# Patient Record
Sex: Female | Born: 1935 | Smoking: Never smoker
Health system: Southern US, Community
[De-identification: ages and names within clinical notes are randomized; demographics above are authoritative.]

## PROBLEM LIST (undated history)

## (undated) DIAGNOSIS — E039 Hypothyroidism, unspecified: Secondary | ICD-10-CM

## (undated) DIAGNOSIS — E213 Hyperparathyroidism, unspecified: Secondary | ICD-10-CM

## (undated) DIAGNOSIS — N189 Chronic kidney disease, unspecified: Secondary | ICD-10-CM

## (undated) DIAGNOSIS — E7800 Pure hypercholesterolemia, unspecified: Secondary | ICD-10-CM

## (undated) DIAGNOSIS — E559 Vitamin D deficiency, unspecified: Secondary | ICD-10-CM

## (undated) DIAGNOSIS — I10 Essential (primary) hypertension: Secondary | ICD-10-CM

## (undated) DIAGNOSIS — M810 Age-related osteoporosis without current pathological fracture: Secondary | ICD-10-CM

## (undated) DIAGNOSIS — I4891 Unspecified atrial fibrillation: Secondary | ICD-10-CM

## (undated) DIAGNOSIS — R911 Solitary pulmonary nodule: Secondary | ICD-10-CM

## (undated) DIAGNOSIS — K5900 Constipation, unspecified: Secondary | ICD-10-CM

## (undated) DIAGNOSIS — T7840XA Allergy, unspecified, initial encounter: Secondary | ICD-10-CM

## (undated) DIAGNOSIS — I5031 Acute diastolic (congestive) heart failure: Secondary | ICD-10-CM

## (undated) DIAGNOSIS — R350 Frequency of micturition: Secondary | ICD-10-CM

## (undated) DIAGNOSIS — C189 Malignant neoplasm of colon, unspecified: Secondary | ICD-10-CM

## (undated) DIAGNOSIS — I509 Heart failure, unspecified: Secondary | ICD-10-CM

## (undated) HISTORY — DX: Constipation, unspecified: K59.00

## (undated) HISTORY — DX: Allergy, unspecified, initial encounter: T78.40XA

## (undated) HISTORY — DX: Pure hypercholesterolemia, unspecified: E78.00

## (undated) HISTORY — DX: Unspecified atrial fibrillation: I48.91

## (undated) HISTORY — DX: Heart failure, unspecified: I50.9

## (undated) HISTORY — DX: Hyperparathyroidism, unspecified: E21.3

## (undated) HISTORY — DX: Hypothyroidism, unspecified: E03.9

## (undated) HISTORY — DX: Chronic kidney disease, unspecified: N18.9

## (undated) HISTORY — DX: Age-related osteoporosis without current pathological fracture: M81.0

## (undated) HISTORY — DX: Solitary pulmonary nodule: R91.1

## (undated) HISTORY — DX: Frequency of micturition: R35.0

## (undated) HISTORY — DX: Essential (primary) hypertension: I10

## (undated) HISTORY — DX: Vitamin D deficiency, unspecified: E55.9

## (undated) HISTORY — PX: OTHER SURGICAL HISTORY: SHX169

---

## 2014-05-29 ENCOUNTER — Other Ambulatory Visit: Payer: Self-pay | Admitting: Internal Medicine

## 2014-05-29 DIAGNOSIS — R0989 Other specified symptoms and signs involving the circulatory and respiratory systems: Secondary | ICD-10-CM

## 2014-06-06 ENCOUNTER — Ambulatory Visit
Admission: RE | Admit: 2014-06-06 | Discharge: 2014-06-06 | Disposition: A | Discharge status: Home / Self Care | Source: Ambulatory Visit | Attending: Internal Medicine | Admitting: Internal Medicine

## 2014-06-06 DIAGNOSIS — R0989 Other specified symptoms and signs involving the circulatory and respiratory systems: Secondary | ICD-10-CM

## 2014-06-12 ENCOUNTER — Other Ambulatory Visit: Payer: Self-pay | Admitting: Gastroenterology

## 2014-06-12 ENCOUNTER — Ambulatory Visit
Admission: RE | Admit: 2014-06-12 | Discharge: 2014-06-12 | Disposition: A | Discharge status: Home / Self Care | Source: Ambulatory Visit | Attending: Gastroenterology | Admitting: Gastroenterology

## 2014-06-12 DIAGNOSIS — R911 Solitary pulmonary nodule: Secondary | ICD-10-CM

## 2014-06-12 DIAGNOSIS — R1084 Generalized abdominal pain: Secondary | ICD-10-CM

## 2014-06-12 DIAGNOSIS — R109 Unspecified abdominal pain: Secondary | ICD-10-CM

## 2014-06-20 ENCOUNTER — Other Ambulatory Visit: Payer: Medicaid Other

## 2014-06-22 ENCOUNTER — Ambulatory Visit
Admission: RE | Admit: 2014-06-22 | Discharge: 2014-06-22 | Disposition: A | Discharge status: Home / Self Care | Payer: Medicaid Other | Source: Ambulatory Visit | Attending: Gastroenterology | Admitting: Gastroenterology

## 2014-06-22 ENCOUNTER — Ambulatory Visit
Admission: RE | Admit: 2014-06-22 | Discharge: 2014-06-22 | Disposition: A | Discharge status: Home / Self Care | Source: Ambulatory Visit | Attending: Gastroenterology | Admitting: Gastroenterology

## 2014-06-22 DIAGNOSIS — R911 Solitary pulmonary nodule: Secondary | ICD-10-CM

## 2014-06-22 DIAGNOSIS — R1084 Generalized abdominal pain: Secondary | ICD-10-CM

## 2014-06-22 MED ORDER — IOHEXOL 300 MG/ML  SOLN
100.0000 mL | Freq: Once | INTRAMUSCULAR | Status: AC | PRN
  Administered 2014-06-22: 100 mL via INTRAVENOUS

## 2014-07-02 ENCOUNTER — Other Ambulatory Visit (HOSPITAL_COMMUNITY): Payer: Self-pay | Admitting: Internal Medicine

## 2014-07-02 DIAGNOSIS — N2889 Other specified disorders of kidney and ureter: Secondary | ICD-10-CM

## 2014-07-02 DIAGNOSIS — C189 Malignant neoplasm of colon, unspecified: Secondary | ICD-10-CM

## 2014-07-03 ENCOUNTER — Encounter (HOSPITAL_COMMUNITY): Payer: Self-pay | Admitting: Emergency Medicine

## 2014-07-03 ENCOUNTER — Emergency Department (HOSPITAL_COMMUNITY): Payer: Medicaid Other

## 2014-07-03 ENCOUNTER — Inpatient Hospital Stay (HOSPITAL_COMMUNITY)
Admission: EM | Admit: 2014-07-03 | Discharge: 2014-07-16 | DRG: 330 | Disposition: A | Discharge status: Hospice | Payer: Medicaid Other | Attending: Internal Medicine | Admitting: Internal Medicine

## 2014-07-03 DIAGNOSIS — K913 Postprocedural intestinal obstruction: Secondary | ICD-10-CM | POA: Diagnosis not present

## 2014-07-03 DIAGNOSIS — N179 Acute kidney failure, unspecified: Secondary | ICD-10-CM | POA: Diagnosis present

## 2014-07-03 DIAGNOSIS — O10019 Pre-existing essential hypertension complicating pregnancy, unspecified trimester: Secondary | ICD-10-CM

## 2014-07-03 DIAGNOSIS — C184 Malignant neoplasm of transverse colon: Principal | ICD-10-CM | POA: Diagnosis present

## 2014-07-03 DIAGNOSIS — I10 Essential (primary) hypertension: Secondary | ICD-10-CM | POA: Diagnosis present

## 2014-07-03 DIAGNOSIS — K922 Gastrointestinal hemorrhage, unspecified: Secondary | ICD-10-CM

## 2014-07-03 DIAGNOSIS — Z515 Encounter for palliative care: Secondary | ICD-10-CM

## 2014-07-03 DIAGNOSIS — Z66 Do not resuscitate: Secondary | ICD-10-CM | POA: Diagnosis present

## 2014-07-03 DIAGNOSIS — E440 Moderate protein-calorie malnutrition: Secondary | ICD-10-CM | POA: Diagnosis present

## 2014-07-03 DIAGNOSIS — D649 Anemia, unspecified: Secondary | ICD-10-CM | POA: Diagnosis present

## 2014-07-03 DIAGNOSIS — N2889 Other specified disorders of kidney and ureter: Secondary | ICD-10-CM | POA: Diagnosis present

## 2014-07-03 DIAGNOSIS — C7800 Secondary malignant neoplasm of unspecified lung: Secondary | ICD-10-CM | POA: Diagnosis present

## 2014-07-03 DIAGNOSIS — R609 Edema, unspecified: Secondary | ICD-10-CM

## 2014-07-03 DIAGNOSIS — F419 Anxiety disorder, unspecified: Secondary | ICD-10-CM | POA: Diagnosis present

## 2014-07-03 DIAGNOSIS — Z6826 Body mass index (BMI) 26.0-26.9, adult: Secondary | ICD-10-CM

## 2014-07-03 DIAGNOSIS — C189 Malignant neoplasm of colon, unspecified: Secondary | ICD-10-CM

## 2014-07-03 DIAGNOSIS — Y839 Surgical procedure, unspecified as the cause of abnormal reaction of the patient, or of later complication, without mention of misadventure at the time of the procedure: Secondary | ICD-10-CM | POA: Diagnosis not present

## 2014-07-03 DIAGNOSIS — I5030 Unspecified diastolic (congestive) heart failure: Secondary | ICD-10-CM | POA: Diagnosis present

## 2014-07-03 DIAGNOSIS — D500 Iron deficiency anemia secondary to blood loss (chronic): Secondary | ICD-10-CM | POA: Diagnosis present

## 2014-07-03 DIAGNOSIS — K644 Residual hemorrhoidal skin tags: Secondary | ICD-10-CM | POA: Diagnosis present

## 2014-07-03 DIAGNOSIS — Z9049 Acquired absence of other specified parts of digestive tract: Secondary | ICD-10-CM

## 2014-07-03 DIAGNOSIS — C801 Malignant (primary) neoplasm, unspecified: Secondary | ICD-10-CM | POA: Diagnosis present

## 2014-07-03 DIAGNOSIS — K625 Hemorrhage of anus and rectum: Secondary | ICD-10-CM

## 2014-07-03 DIAGNOSIS — I4891 Unspecified atrial fibrillation: Secondary | ICD-10-CM | POA: Diagnosis not present

## 2014-07-03 DIAGNOSIS — G4700 Insomnia, unspecified: Secondary | ICD-10-CM | POA: Diagnosis present

## 2014-07-03 DIAGNOSIS — D62 Acute posthemorrhagic anemia: Secondary | ICD-10-CM | POA: Diagnosis present

## 2014-07-03 DIAGNOSIS — E039 Hypothyroidism, unspecified: Secondary | ICD-10-CM | POA: Diagnosis present

## 2014-07-03 DIAGNOSIS — R0602 Shortness of breath: Secondary | ICD-10-CM

## 2014-07-03 HISTORY — DX: Malignant neoplasm of colon, unspecified: C18.9

## 2014-07-03 LAB — COMPREHENSIVE METABOLIC PANEL
ALT: 11 U/L (ref 0–35)
ANION GAP: 16 — AB (ref 5–15)
AST: 14 U/L (ref 0–37)
Albumin: 3.6 g/dL (ref 3.5–5.2)
Alkaline Phosphatase: 72 U/L (ref 39–117)
BUN: 27 mg/dL — ABNORMAL HIGH (ref 6–23)
CO2: 25 meq/L (ref 19–32)
CREATININE: 1.42 mg/dL — AB (ref 0.50–1.10)
Calcium: 10.1 mg/dL (ref 8.4–10.5)
Chloride: 94 mEq/L — ABNORMAL LOW (ref 96–112)
GFR calc Af Amer: 37 mL/min — ABNORMAL LOW (ref >90)
GFR, EST NON AFRICAN AMERICAN: 32 mL/min — AB (ref >90)
Glucose, Bld: 103 mg/dL — ABNORMAL HIGH (ref 70–99)
Potassium: 4 mEq/L (ref 3.7–5.3)
Sodium: 135 mEq/L — ABNORMAL LOW (ref 137–147)
Total Bilirubin: 0.2 mg/dL — ABNORMAL LOW (ref 0.3–1.2)
Total Protein: 7 g/dL (ref 6.0–8.3)

## 2014-07-03 LAB — CBC WITH DIFFERENTIAL/PLATELET
BASOS ABS: 0.1 10*3/uL (ref 0.0–0.1)
Basophils Relative: 1 % (ref 0–1)
EOS PCT: 3 % (ref 0–5)
Eosinophils Absolute: 0.2 10*3/uL (ref 0.0–0.7)
HCT: 20 % — ABNORMAL LOW (ref 36.0–46.0)
Hemoglobin: 6.2 g/dL — CL (ref 12.0–15.0)
Lymphocytes Relative: 22 % (ref 12–46)
Lymphs Abs: 1.7 10*3/uL (ref 0.7–4.0)
MCH: 20.9 pg — AB (ref 26.0–34.0)
MCHC: 31 g/dL (ref 30.0–36.0)
MCV: 67.6 fL — ABNORMAL LOW (ref 78.0–100.0)
MONO ABS: 0.8 10*3/uL (ref 0.1–1.0)
Monocytes Relative: 11 % (ref 3–12)
NEUTROS PCT: 63 % (ref 43–77)
Neutro Abs: 4.9 10*3/uL (ref 1.7–7.7)
Platelets: 350 10*3/uL (ref 150–400)
RBC: 2.96 MIL/uL — AB (ref 3.87–5.11)
RDW: 16.7 % — ABNORMAL HIGH (ref 11.5–15.5)
WBC: 7.7 10*3/uL (ref 4.0–10.5)

## 2014-07-03 LAB — I-STAT CHEM 8, ED
BUN: 26 mg/dL — ABNORMAL HIGH (ref 6–23)
Calcium, Ion: 1.28 mmol/L (ref 1.13–1.30)
Chloride: 94 mEq/L — ABNORMAL LOW (ref 96–112)
Creatinine, Ser: 1.6 mg/dL — ABNORMAL HIGH (ref 0.50–1.10)
Glucose, Bld: 106 mg/dL — ABNORMAL HIGH (ref 70–99)
HCT: 22 % — ABNORMAL LOW (ref 36.0–46.0)
Hemoglobin: 7.5 g/dL — ABNORMAL LOW (ref 12.0–15.0)
Potassium: 3.6 mEq/L — ABNORMAL LOW (ref 3.7–5.3)
SODIUM: 131 meq/L — AB (ref 137–147)
TCO2: 24 mmol/L (ref 0–100)

## 2014-07-03 LAB — I-STAT TROPONIN, ED: TROPONIN I, POC: 0.01 ng/mL (ref 0.00–0.08)

## 2014-07-03 LAB — LIPASE, BLOOD: LIPASE: 48 U/L (ref 11–59)

## 2014-07-03 LAB — ABO/RH: ABO/RH(D): O POS

## 2014-07-03 LAB — PROTIME-INR
INR: 1.06 (ref 0.00–1.49)
PROTHROMBIN TIME: 13.9 s (ref 11.6–15.2)

## 2014-07-03 LAB — POC OCCULT BLOOD, ED: FECAL OCCULT BLD: POSITIVE — AB

## 2014-07-03 LAB — PREPARE RBC (CROSSMATCH)

## 2014-07-03 LAB — APTT: aPTT: 28 seconds (ref 24–37)

## 2014-07-03 MED ORDER — SODIUM CHLORIDE 0.9 % IV SOLN
Freq: Once | INTRAVENOUS | Status: AC
  Administered 2014-07-03: 23:00:00 via INTRAVENOUS

## 2014-07-03 MED ORDER — POTASSIUM CHLORIDE IN NACL 20-0.9 MEQ/L-% IV SOLN
INTRAVENOUS | Status: DC
  Administered 2014-07-04 – 2014-07-05 (×2): via INTRAVENOUS
  Filled 2014-07-03 (×4): qty 1000

## 2014-07-03 NOTE — H&P (Addendum)
Hospitalist Admission History and Physical  Patient name: Kirsten Golden Medical record number: [id number] Date of birth: 01-13-1924 Age: 80 y.o. Gender: female  Primary Care Provider: No primary provider on file.  Chief Complaint: Anemia, Colon Cancer   History of Present Illness:This is a 80 y.o. year old female with significant past medical history of colon cancer  presenting with anemia. Pt is originally from the middle [place]. Per the son, pt was recently diagnosed with rectal/colon cancer. Was seen by [facility] with recent biopsy. Had CT abd/pelvis and chest. Had Enhancing 4.5 cm left upper renal pole cortical mass, highly suspicious for renal cell carcinoma. bilateral  pulmonary nodules Short segmental descending colonic wall thickening concerning for adenocarcinoma or focal colitis. Also with Indeterminate sclerotic lesion involving the right lamina of T9. Per son, pt was scheduled for surgical follow up in 2 weeks. Pt went to PCPs office today. Noted low hgb (son unsure of level). Was redirected to the ER. Son states that she has had some mild weakness and SOB over past few weeks.  Hemodynamically stable on presentation. Hgb 6.2. Follow up hgb 7.5. Cr 1.6. INR 1.06. Hemoccult trace positive.  CXR WNL. Per EDP, she spoke with Dr. Rosendo Gros w/ [facility]. They will consult in am. In the process of receiving 1 unit pRBC.   Assessment and Plan: Kirsten Golden is a 80 y.o. year old female presenting with Anemia   Active Problems:   Anemia   1-Anemia  -secondary to acute vs. Sub acute blood loss ( Has been ongoing for extended period of time per son) -transfuse 1unit pRBC  -serial CBCs  2-Cancer  -noted multiple areas of concerning malignancy on imaging -pending surgical consult in am  -may need H-O c/s depending on overall goals for family-may need family discussion  3- AKI  -Likely prerenal in etiology -BUN:Cr 20:1  -gently hydrate  -check FeNa   -follow   FEN/GI: NPO  Prophylaxis: SCDs Disposition: pending further evaluation  Code Status:Full Code    Patient Active Problem List   Diagnosis Date Noted  . Anemia 07/03/2014   Past Medical History: Past Medical History  Diagnosis Date  . Colon cancer     Past Surgical History: No past surgical history on file.  Social History: History   Social History  . Marital Status: Widowed    Spouse Name: N/A    Number of Children: N/A  . Years of Education: N/A   Social History Main Topics  . Smoking status: Not on file  . Smokeless tobacco: Not on file  . Alcohol Use: Not on file  . Drug Use: Not on file  . Sexual Activity: Not on file   Other Topics Concern  . Not on file   Social History Narrative  . No narrative on file    Family History: No family history on file.  Allergies: Not on File  Current Facility-Administered Medications  Medication Dose Route Frequency Provider Last Rate Last Dose  . 0.9 % NaCl with KCl 20 mEq/ L  infusion   Intravenous Continuous Shanda Howells, MD       No current outpatient prescriptions on file.   Review Of Systems: 12 point ROS negative except as noted above in HPI.  Physical Exam: Filed Vitals:   07/03/14 2310  BP:   Pulse: 101  Temp:   Resp: 20    General: alert and cooperative HEENT: PERRLA and extra ocular movement intact Heart: S1, S2 normal, no murmur, rub or gallop,  regular rate and rhythm Lungs: clear to auscultation, no wheezes or rales and unlabored breathing Abdomen: abdomen is soft without significant tenderness, masses, organomegaly or guarding Extremities: extremities normal, atraumatic, no cyanosis or edema Skin:no rashes, no ecchymoses Neurology: normal without focal findings  Labs and Imaging: Lab Results  Component Value Date/Time   NA 131* 07/03/2014  7:30 PM   K 3.6* 07/03/2014  7:30 PM   CL 94* 07/03/2014  7:30 PM   CO2 25 07/03/2014  7:16 PM   BUN 26* 07/03/2014  7:30 PM    CREATININE 1.60* 07/03/2014  7:30 PM   GLUCOSE 106* 07/03/2014  7:30 PM   Lab Results  Component Value Date   WBC 7.7 07/03/2014   HGB 7.5* 07/03/2014   HCT 22.0* 07/03/2014   MCV 67.6* 07/03/2014   PLT 350 07/03/2014    Dg Chest Port 1 View  07/03/2014   CLINICAL DATA:  Anemia, colon cancer, scheduled for colostomy next week. Possible renal cancer.  EXAM: PORTABLE CHEST - 1 VIEW  COMPARISON:  CT chest dated 06/22/2014.  FINDINGS: Chronic interstitial markings. Mild bibasilar atelectasis. No focal consolidation. No pleural effusion or pneumothorax.  The heart is top-normal in size.  Moderate hiatal hernia.  IMPRESSION: No evidence of acute cardiopulmonary disease.  Moderate hiatal hernia.   Electronically Signed   By: Julian Hy M.D.   On: 07/03/2014 19:18           Shanda Howells MD  Pager: [phone]

## 2014-07-03 NOTE — ED Notes (Signed)
Dr Zoe Lan aware of hgb 6.2

## 2014-07-03 NOTE — ED Notes (Signed)
Blood transfusing upon transfer to floor

## 2014-07-03 NOTE — ED Notes (Signed)
New bed requested,  After giving report  Floor stated they can't take her because of telemetry monitoring

## 2014-07-03 NOTE — ED Notes (Signed)
Pt oxygen was 94% on room air,  This writer placed pt on 2L O2 [place] now 99%

## 2014-07-03 NOTE — ED Notes (Signed)
Critical Hgb of 6.2 given to Primary nurse [facility] RN

## 2014-07-03 NOTE — ED Notes (Signed)
Pt coming from doctors office, reported pt has hgb 6.8. Pt has colon cancer, scheduled to get a colostomy next week. Possible kidney cancer now as well. Pt denies pain. Pt does not speak [place]. Daughter translating.

## 2014-07-03 NOTE — ED Notes (Signed)
[phone]  [name]  (daughter)

## 2014-07-04 DIAGNOSIS — Z515 Encounter for palliative care: Secondary | ICD-10-CM | POA: Diagnosis not present

## 2014-07-04 DIAGNOSIS — K644 Residual hemorrhoidal skin tags: Secondary | ICD-10-CM | POA: Diagnosis present

## 2014-07-04 DIAGNOSIS — N2889 Other specified disorders of kidney and ureter: Secondary | ICD-10-CM | POA: Diagnosis present

## 2014-07-04 DIAGNOSIS — Z66 Do not resuscitate: Secondary | ICD-10-CM | POA: Diagnosis present

## 2014-07-04 DIAGNOSIS — C184 Malignant neoplasm of transverse colon: Secondary | ICD-10-CM | POA: Diagnosis present

## 2014-07-04 DIAGNOSIS — F419 Anxiety disorder, unspecified: Secondary | ICD-10-CM | POA: Diagnosis present

## 2014-07-04 DIAGNOSIS — Z6826 Body mass index (BMI) 26.0-26.9, adult: Secondary | ICD-10-CM | POA: Diagnosis not present

## 2014-07-04 DIAGNOSIS — C7800 Secondary malignant neoplasm of unspecified lung: Secondary | ICD-10-CM | POA: Diagnosis present

## 2014-07-04 DIAGNOSIS — I10 Essential (primary) hypertension: Secondary | ICD-10-CM | POA: Diagnosis present

## 2014-07-04 DIAGNOSIS — N179 Acute kidney failure, unspecified: Secondary | ICD-10-CM | POA: Diagnosis present

## 2014-07-04 DIAGNOSIS — Y839 Surgical procedure, unspecified as the cause of abnormal reaction of the patient, or of later complication, without mention of misadventure at the time of the procedure: Secondary | ICD-10-CM | POA: Diagnosis not present

## 2014-07-04 DIAGNOSIS — D62 Acute posthemorrhagic anemia: Secondary | ICD-10-CM | POA: Diagnosis present

## 2014-07-04 DIAGNOSIS — G4700 Insomnia, unspecified: Secondary | ICD-10-CM | POA: Diagnosis present

## 2014-07-04 DIAGNOSIS — K922 Gastrointestinal hemorrhage, unspecified: Secondary | ICD-10-CM | POA: Diagnosis present

## 2014-07-04 DIAGNOSIS — E039 Hypothyroidism, unspecified: Secondary | ICD-10-CM | POA: Diagnosis present

## 2014-07-04 DIAGNOSIS — I5030 Unspecified diastolic (congestive) heart failure: Secondary | ICD-10-CM | POA: Diagnosis present

## 2014-07-04 DIAGNOSIS — E440 Moderate protein-calorie malnutrition: Secondary | ICD-10-CM | POA: Diagnosis present

## 2014-07-04 DIAGNOSIS — K913 Postprocedural intestinal obstruction: Secondary | ICD-10-CM | POA: Diagnosis not present

## 2014-07-04 DIAGNOSIS — I4891 Unspecified atrial fibrillation: Secondary | ICD-10-CM | POA: Diagnosis not present

## 2014-07-04 LAB — COMPREHENSIVE METABOLIC PANEL
ALT: 9 U/L (ref 0–35)
AST: 14 U/L (ref 0–37)
Albumin: 3.4 g/dL — ABNORMAL LOW (ref 3.5–5.2)
Alkaline Phosphatase: 65 U/L (ref 39–117)
Anion gap: 13 (ref 5–15)
BUN: 20 mg/dL (ref 6–23)
CO2: 25 mEq/L (ref 19–32)
Calcium: 9.4 mg/dL (ref 8.4–10.5)
Chloride: 101 mEq/L (ref 96–112)
Creatinine, Ser: 0.91 mg/dL (ref 0.50–1.10)
GFR calc non Af Amer: 55 mL/min — ABNORMAL LOW (ref >90)
GFR, EST AFRICAN AMERICAN: 63 mL/min — AB (ref >90)
GLUCOSE: 101 mg/dL — AB (ref 70–99)
POTASSIUM: 3.7 meq/L (ref 3.7–5.3)
SODIUM: 139 meq/L (ref 137–147)
TOTAL PROTEIN: 6.4 g/dL (ref 6.0–8.3)
Total Bilirubin: 0.9 mg/dL (ref 0.3–1.2)

## 2014-07-04 LAB — CBC WITH DIFFERENTIAL/PLATELET
BASOS ABS: 0 10*3/uL (ref 0.0–0.1)
BASOS ABS: 0 10*3/uL (ref 0.0–0.1)
BASOS PCT: 1 % (ref 0–1)
Basophils Absolute: 0.1 10*3/uL (ref 0.0–0.1)
Basophils Relative: 1 % (ref 0–1)
Basophils Relative: 1 % (ref 0–1)
EOS ABS: 0.2 10*3/uL (ref 0.0–0.7)
EOS ABS: 0.2 10*3/uL (ref 0.0–0.7)
EOS PCT: 3 % (ref 0–5)
EOS PCT: 4 % (ref 0–5)
Eosinophils Absolute: 0.3 10*3/uL (ref 0.0–0.7)
Eosinophils Relative: 3 % (ref 0–5)
HCT: 29.1 % — ABNORMAL LOW (ref 36.0–46.0)
HCT: 31 % — ABNORMAL LOW (ref 36.0–46.0)
HEMATOCRIT: 32.7 % — AB (ref 36.0–46.0)
HEMOGLOBIN: 9.5 g/dL — AB (ref 12.0–15.0)
Hemoglobin: 10.6 g/dL — ABNORMAL LOW (ref 12.0–15.0)
Hemoglobin: 10.1 g/dL — ABNORMAL LOW (ref 12.0–15.0)
LYMPHS ABS: 1.1 10*3/uL (ref 0.7–4.0)
Lymphocytes Relative: 18 % (ref 12–46)
Lymphocytes Relative: 21 % (ref 12–46)
Lymphocytes Relative: 19 % (ref 12–46)
Lymphs Abs: 1.3 10*3/uL (ref 0.7–4.0)
Lymphs Abs: 1.3 10*3/uL (ref 0.7–4.0)
MCH: 23.6 pg — AB (ref 26.0–34.0)
MCH: 23.8 pg — AB (ref 26.0–34.0)
MCH: 23.7 pg — ABNORMAL LOW (ref 26.0–34.0)
MCHC: 32.6 g/dL (ref 30.0–36.0)
MCHC: 32.4 g/dL (ref 30.0–36.0)
MCHC: 32.6 g/dL (ref 30.0–36.0)
MCV: 72.2 fL — AB (ref 78.0–100.0)
MCV: 73.5 fL — ABNORMAL LOW (ref 78.0–100.0)
MCV: 72.8 fL — AB (ref 78.0–100.0)
MONO ABS: 0.8 10*3/uL (ref 0.1–1.0)
MONO ABS: 0.6 10*3/uL (ref 0.1–1.0)
Monocytes Absolute: 0.5 10*3/uL (ref 0.1–1.0)
Monocytes Relative: 9 % (ref 3–12)
Monocytes Relative: 12 % (ref 3–12)
Monocytes Relative: 8 % (ref 3–12)
NEUTROS ABS: 4.1 10*3/uL (ref 1.7–7.7)
NEUTROS ABS: 5 10*3/uL (ref 1.7–7.7)
Neutro Abs: 4.2 10*3/uL (ref 1.7–7.7)
Neutrophils Relative %: 69 % (ref 43–77)
Neutrophils Relative %: 63 % (ref 43–77)
Neutrophils Relative %: 68 % (ref 43–77)
PLATELETS: 331 10*3/uL (ref 150–400)
Platelets: 318 10*3/uL (ref 150–400)
Platelets: 341 10*3/uL (ref 150–400)
RBC: 4.03 MIL/uL (ref 3.87–5.11)
RBC: 4.45 MIL/uL (ref 3.87–5.11)
RBC: 4.26 MIL/uL (ref 3.87–5.11)
RDW: 20.1 % — ABNORMAL HIGH (ref 11.5–15.5)
RDW: 19.6 % — AB (ref 11.5–15.5)
RDW: 19.8 % — AB (ref 11.5–15.5)
WBC: 6 10*3/uL (ref 4.0–10.5)
WBC: 6.5 10*3/uL (ref 4.0–10.5)
WBC: 7.2 10*3/uL (ref 4.0–10.5)

## 2014-07-04 LAB — MAGNESIUM: Magnesium: 2.1 mg/dL (ref 1.5–2.5)

## 2014-07-04 MED ORDER — CETYLPYRIDINIUM CHLORIDE 0.05 % MT LIQD
7.0000 mL | Freq: Two times a day (BID) | OROMUCOSAL | Status: DC
  Administered 2014-07-04 – 2014-07-16 (×23): 7 mL via OROMUCOSAL

## 2014-07-04 NOTE — Consult Note (Signed)
Urology Consult  Referring physician:   Glennon Hamilton, MD Reason for referral:   L renal mass  Chief Complaint:  Weakness, bleeding per rectum  History of Present Illness:  Kirsten Golden is an 80 yo [place] female ( Speaks [place]), with recent 25 lb weight loss, abdominal pain, rectal; bleeding, post recent GI evaluation per Dr. Magod/[facility]), with colonoscopy showing distal transverse annular colon lesion, with biopsy showing adenocarcinoma.  CT abd/pelvis and chest showed an enhancing 4.5 cm left upper renal pole cortical mass, highly suspicious for renal cell carcinoma. And bilateral pulmonary nodules , and short segmental descending colonic wall thickening concerning for adenocarcinoma or focal colitis. Also, CT showed  an Indeterminate sclerotic lesion involving the right lamina of T9.    She was originally scheduled for follow-up in General Surgery for 2 weeks, but developed profound weakness and anemia, and. was redirected to the ER. Son states that she has had some mild weakness and SOB over past few weeks.  Hemodynamically stable on presentation. Hgb 6.2. Follow up hgb 7.5. Cr 1.6. INR 1.06. Hemoccult trace positive. CXR WNL. Case discussed with Dr. Hassell Done.    Past Medical History  Diagnosis Date  . Colon cancer    History reviewed. No pertinent past surgical history.  Medications: I have reviewed the patient's current medications. Allergies: No Known Allergies  No family history on file. Social History:  reports that she has never smoked. She does not have any smokeless tobacco history on file. She reports that she does not drink alcohol. Her drug history is not on file.  ROS: All systems are reviewed and negative except as noted. No flank pain, no gross hematuria. No tobacco use.   Physical Exam:  Vital signs in last 24 hours: Temp:  [97.6 F (36.4 C)-98.7 F (37.1 C)] 97.6 F (36.4 C) (10/14 0500) Pulse Rate:  [85-118] 90 (10/14 0500) Resp:  [14-34] 16 (10/14  0500) BP: (126-149)/(56-81) 148/77 mmHg (10/14 0500) SpO2:  [93 %-100 %] 98 % (10/14 0500) Weight:  [47.5 kg (104 lb 11.5 oz)] 47.5 kg (104 lb 11.5 oz) (10/14 0145)  Cardiovascular: Skin warm; not flushed Respiratory: Breaths quiet; no shortness of breath Abdomen: No masses. + BS.  Neurological: Normal sensation to touch Musculoskeletal: Normal motor function arms and legs Lymphatics: No inguinal adenopathy Skin: No rashes Genitourinary: Normal BUS. Neg Flank.   Laboratory Data:  Results for orders placed during the hospital encounter of 07/03/14 (from the past 72 hour(s))  COMPREHENSIVE METABOLIC PANEL     Status: Abnormal   Collection Time    07/03/14  7:16 PM      Result Value Ref Range   Sodium 135 (*) 137 - 147 mEq/L   Potassium 4.0  3.7 - 5.3 mEq/L   Chloride 94 (*) 96 - 112 mEq/L   CO2 25  19 - 32 mEq/L   Glucose, Bld 103 (*) 70 - 99 mg/dL   BUN 27 (*) 6 - 23 mg/dL   Creatinine, Ser 1.42 (*) 0.50 - 1.10 mg/dL   Calcium 10.1  8.4 - 10.5 mg/dL   Total Protein 7.0  6.0 - 8.3 g/dL   Albumin 3.6  3.5 - 5.2 g/dL   AST 14  0 - 37 U/L   ALT 11  0 - 35 U/L   Alkaline Phosphatase 72  39 - 117 U/L   Total Bilirubin 0.2 (*) 0.3 - 1.2 mg/dL   GFR calc non Af Amer 32 (*) >90 mL/min  GFR calc Af Amer 37 (*) >90 mL/min   Comment: (NOTE)     The eGFR has been calculated using the CKD EPI equation.     This calculation has not been validated in all clinical situations.     eGFR's persistently <90 mL/min signify possible Chronic Kidney     Disease.   Anion gap 16 (*) 5 - 15  LIPASE, BLOOD     Status: None   Collection Time    07/03/14  7:16 PM      Result Value Ref Range   Lipase 48  11 - 59 U/L  CBC WITH DIFFERENTIAL     Status: Abnormal   Collection Time    07/03/14  7:16 PM      Result Value Ref Range   WBC 7.7  4.0 - 10.5 K/uL   RBC 2.96 (*) 3.87 - 5.11 MIL/uL   Hemoglobin 6.2 (*) 12.0 - 15.0 g/dL   Comment: CRITICAL RESULT CALLED TO, READ BACK BY AND VERIFIED WITH:      SPOKE WITH TEUP,M [id number] [place],N   HCT 20.0 (*) 36.0 - 46.0 %   MCV 67.6 (*) 78.0 - 100.0 fL   MCH 20.9 (*) 26.0 - 34.0 pg   MCHC 31.0  30.0 - 36.0 g/dL   RDW 16.7 (*) 11.5 - 15.5 %   Platelets 350  150 - 400 K/uL   Neutrophils Relative % 63  43 - 77 %   Lymphocytes Relative 22  12 - 46 %   Monocytes Relative 11  3 - 12 %   Eosinophils Relative 3  0 - 5 %   Basophils Relative 1  0 - 1 %   Neutro Abs 4.9  1.7 - 7.7 K/uL   Lymphs Abs 1.7  0.7 - 4.0 K/uL   Monocytes Absolute 0.8  0.1 - 1.0 K/uL   Eosinophils Absolute 0.2  0.0 - 0.7 K/uL   Basophils Absolute 0.1  0.0 - 0.1 K/uL   RBC Morphology POLYCHROMASIA PRESENT     Comment: TARGET CELLS     ELLIPTOCYTES   WBC Morphology TOXIC GRANULATION     Comment: INCREASED BANDS (>20% BANDS)     MICROCYTES  APTT     Status: None   Collection Time    07/03/14  7:16 PM      Result Value Ref Range   aPTT 28  24 - 37 seconds  PROTIME-INR     Status: None   Collection Time    07/03/14  7:16 PM      Result Value Ref Range   Prothrombin Time 13.9  11.6 - 15.2 seconds   INR 1.06  0.00 - 1.49  TYPE AND SCREEN     Status: None   Collection Time    07/03/14  7:16 PM      Result Value Ref Range   ABO/RH(D) O POS     Antibody Screen NEG     Sample Expiration 07/06/2014     Unit Number [id number]     Blood Component Type RBC LR PHER2     Unit division 00     Status of Unit ISSUED     Transfusion Status OK TO TRANSFUSE     Crossmatch Result Compatible     Unit Number [id number]     Blood Component Type RBC LR PHER1     Unit division 00     Status of Unit ISSUED,FINAL     Transfusion Status OK TO TRANSFUSE  Crossmatch Result Compatible    PREPARE RBC (CROSSMATCH)     Status: None   Collection Time    07/03/14  7:21 PM      Result Value Ref Range   Order Confirmation ORDER PROCESSED BY BLOOD BANK    I-STAT [place], ED     Status: None   Collection Time    07/03/14  7:29 PM      Result Value Ref Range   Troponin  i, poc 0.01  0.00 - 0.08 ng/mL   Comment 3            Comment: Due to the release kinetics of cTnI,     a negative result within the first hours     of the onset of symptoms does not rule out     myocardial infarction with certainty.     If myocardial infarction is still suspected,     repeat the test at appropriate intervals.  I-STAT CHEM 8, ED     Status: Abnormal   Collection Time    07/03/14  7:30 PM      Result Value Ref Range   Sodium 131 (*) 137 - 147 mEq/L   Potassium 3.6 (*) 3.7 - 5.3 mEq/L   Chloride 94 (*) 96 - 112 mEq/L   BUN 26 (*) 6 - 23 mg/dL   Creatinine, Ser 1.60 (*) 0.50 - 1.10 mg/dL   Glucose, Bld 106 (*) 70 - 99 mg/dL   Calcium, Ion 1.28  1.13 - 1.30 mmol/L   TCO2 24  0 - 100 mmol/L   Hemoglobin 7.5 (*) 12.0 - 15.0 g/dL   HCT 22.0 (*) 36.0 - 46.0 %  ABO/RH     Status: None   Collection Time    07/03/14  9:58 PM      Result Value Ref Range   ABO/RH(D) O POS    POC OCCULT BLOOD, ED     Status: Abnormal   Collection Time    07/03/14 10:26 PM      Result Value Ref Range   Fecal Occult Bld POSITIVE (*) NEGATIVE  COMPREHENSIVE METABOLIC PANEL     Status: Abnormal   Collection Time    07/04/14  8:02 AM      Result Value Ref Range   Sodium 139  137 - 147 mEq/L   Comment: REPEATED TO VERIFY     DELTA CHECK NOTED   Potassium 3.7  3.7 - 5.3 mEq/L   Chloride 101  96 - 112 mEq/L   CO2 25  19 - 32 mEq/L   Glucose, Bld 101 (*) 70 - 99 mg/dL   BUN 20  6 - 23 mg/dL   Creatinine, Ser 0.91  0.50 - 1.10 mg/dL   Comment: REPEATED TO VERIFY     DELTA CHECK NOTED   Calcium 9.4  8.4 - 10.5 mg/dL   Total Protein 6.4  6.0 - 8.3 g/dL   Albumin 3.4 (*) 3.5 - 5.2 g/dL   AST 14  0 - 37 U/L   ALT 9  0 - 35 U/L   Alkaline Phosphatase 65  39 - 117 U/L   Total Bilirubin 0.9  0.3 - 1.2 mg/dL   GFR calc non Af Amer 55 (*) >90 mL/min   GFR calc Af Amer 63 (*) >90 mL/min   Comment: (NOTE)     The eGFR has been calculated using the CKD EPI equation.     This calculation has not  been validated in all clinical  situations.     eGFR's persistently <90 mL/min signify possible Chronic Kidney     Disease.   Anion gap 13  5 - 15  CBC WITH DIFFERENTIAL     Status: Abnormal   Collection Time    07/04/14  8:02 AM      Result Value Ref Range   WBC 6.0  4.0 - 10.5 K/uL   RBC 4.03  3.87 - 5.11 MIL/uL   Hemoglobin 9.5 (*) 12.0 - 15.0 g/dL   Comment: REPEATED TO VERIFY     DELTA CHECK NOTED     POST TRANSFUSION SPECIMEN   HCT 29.1 (*) 36.0 - 46.0 %   MCV 72.2 (*) 78.0 - 100.0 fL   MCH 23.6 (*) 26.0 - 34.0 pg   MCHC 32.6  30.0 - 36.0 g/dL   RDW 20.1 (*) 11.5 - 15.5 %   Platelets 318  150 - 400 K/uL   Neutrophils Relative % 69  43 - 77 %   Lymphocytes Relative 18  12 - 46 %   Monocytes Relative 9  3 - 12 %   Eosinophils Relative 3  0 - 5 %   Basophils Relative 1  0 - 1 %   Neutro Abs 4.1  1.7 - 7.7 K/uL   Lymphs Abs 1.1  0.7 - 4.0 K/uL   Monocytes Absolute 0.5  0.1 - 1.0 K/uL   Eosinophils Absolute 0.2  0.0 - 0.7 K/uL   Basophils Absolute 0.1  0.0 - 0.1 K/uL  MAGNESIUM     Status: None   Collection Time    07/04/14  8:02 AM      Result Value Ref Range   Magnesium 2.1  1.5 - 2.5 mg/dL   No results found for this or any previous visit (from the past 240 hour(s)). Creatinine:  Recent Labs  07/03/14 1916 07/03/14 1930 07/04/14 0802  CREATININE 1.42* 1.60* 0.91    Xrays:  CLINICAL DATA: Generalized abdominal pain  EXAM:  CT CHEST, ABDOMEN, AND PELVIS WITH CONTRAST  TECHNIQUE:  Multidetector CT imaging of the chest, abdomen and pelvis was  performed following the standard protocol during bolus  administration of intravenous contrast.  CONTRAST: 148m OMNIPAQUE IOHEXOL 300 MG/ML SOLN  BUN and creatinine were obtained on site at [facility]  [address]  Results: BUN 21 mg/dL, Creatinine 1.0 mg/dL.  COMPARISON: No similar prior exam is available at this institution  for comparison or on [facility]. Chest/abdomen radiographs  06/12/2014  are reviewed.  FINDINGS:  CT CHEST FINDINGS  Thyroid is inhomogeneous. Ascending aortic ectasia is identified  measuring 3.9 x 3.6 cm at the level of the main pulmonary artery  bifurcation image 22. Descending thoracic aorta is tortuous and  ectatic. Large hiatal hernia containing stomach is noted. Severe  atheromatous aortic calcification and coronary arterial  calcification noted. Heart size is moderately enlarged. No  lymphadenopathy. Fluid in the superior pericardial recess is noted.  Mild biapical pleural thickening is noted. 5 mm left upper lobe  pulmonary parenchymal nodule image 15. 1.6 cm right lower lobe  pulmonary nodule identified image 36. No pleural effusion.  CT ABDOMEN AND PELVIS FINDINGS  Lower chest: See above dedicated report  Hepatobiliary: Sub cm hypodense probable hepatic cysts or biliary  hamartomas noted. 1.5 cm dominant caudate cyst incidentally noted.  Gallbladder is normal. Mild intrahepatic ductal dilatation and mild  fusiform prominence of the common duct with tapering to the ampulla  noted.  Pancreas: Normal  Spleen: Normal  Adrenals/Urinary Tract: There is an enhancing inhomogeneous 4.5 x  4.2 cm left upper renal pole cortical mass image 50. A fat plane is  present between this mass and adjacent psoas muscle and perinephric  fat. Bilateral multiple too small to characterize renal cortical  hypodense lesions are identified. Some hypodense renal cortical  masses cannot be confidently characterized as cysts, including  dominant 6 cm right lower renal pole cyst and 2.0 cm left mid renal  cortical cyst image 52. No hydroureteronephrosis. No renal vein  filling defects to suggest tumor thrombus.  Stomach/Bowel: Fecal impaction is present within the rectum. No  bowel wall thickening or focal segmental dilatation. Normal  appendix. Moderate volume of stool elsewhere throughout the colon.  There is short segmental descending colonic wall thickening with  mild  surrounding stranding image 59, without surrounding fluid  collection identified.  Vascular/Lymphatic: No lymphadenopathy. Severe atheromatous aortic  calcification without aneurysm.  Reproductive: Uterus and ovaries are unremarkable.  Bladder is normal in appearance.  Other: Presacral soft tissue stranding is identified which may  indicate cellulitis or decubitus ulcer, correlate clinically.  Musculoskeletal: Subjectively osteopenic, with right lower thoracic  curvature centered at T7. Evidence of L2 posterior decompression.  Sclerotic lesion within the right lamina of T9 image 31. Possible  healing fracture right lateral fifth rib image 32.  IMPRESSION:  Enhancing 4.5 cm left upper renal pole cortical mass, highly  suspicious for renal cell carcinoma. The presence of bilateral  pulmonary nodules as described above raises the question of  metastatic disease. Consider PET-CT for further evaluation.  Short segmental descending colonic wall thickening for which primary  differential considerations include adenocarcinoma or focal colitis  or diverticulitis without complicating feature. Consider colonoscopy  for further evaluation.  Indeterminate sclerotic lesion involving the right lamina of T9.  Metastatic disease or bone island could appear similar and could be  further evaluated at PET-CT.  Healing right lateral fifth rib fracture.  These results will be called to the ordering clinician or  representative by the Radiologist Assistant, and communication  documented in the PACS or zVision Dashboard.  Electronically Signed  By: Conchita Paris M.D.  On: 06/22/2014 12:29    Impression/Assessment:  Left upper pole Renal Cell Carcinoma. Per the family request, I have not discussed the diagnosis with the patient. Her bowel carcinoma takes precedent  this time. Unclear if pulmonary nodules are related to her renal cell carcinoma or not. Family has indicated that they do not want either  chemotherapy or radiation therapy.   Plan:  Await General surgery evaluation and plan.   Kirsten Golden I 07/04/2014, 1:00 PM

## 2014-07-04 NOTE — ED Provider Notes (Signed)
CSN: [id number]     Arrival date & time 07/03/14  1843 History   First MD Initiated Contact with Patient 07/03/14 1842     Chief Complaint  Patient presents with  . low hemoglobin      (Consider location/radiation/quality/duration/timing/severity/associated sxs/prior Treatment) HPI The patient is sent in by Dr.Jadalai for low hemoglobin. He contacted me and reported her hemoglobin today was at 6.5 and hematocrit at 21.6. He reports the patient had a colonoscopy approximately one week ago and has a diagnosis of colon cancer. This is a new diagnosis for her. He reports he had contact with Dr. Kaylyn Lim is the planned general surgeon to further manage the patient's colon cancer. Per the family the patient has had some black appearing stool. She has had no syncopal episodes or falls.   Past Medical History  Diagnosis Date  . Colon cancer    History reviewed. No pertinent past surgical history. No family history on file. History  Substance Use Topics  . Smoking status: Never Smoker   . Smokeless tobacco: Not on file  . Alcohol Use: No   OB History   Grav Para Term Preterm Abortions TAB SAB Ect Mult Living                 Review of Systems  10 Systems reviewed and are negative for acute change except as noted in the HPI.   Allergies  Review of patient's allergies indicates no known allergies.  Home Medications   Prior to Admission medications   Not on File   BP 140/61  Pulse 91  Temp(Src) 98.3 F (36.8 C) (Oral)  Resp 20  SpO2 100% Physical Exam  Constitutional: She is oriented to person, place, and time. She appears well-developed and well-nourished.  HENT:  Head: Normocephalic and atraumatic.  Eyes: EOM are normal. Pupils are equal, round, and reactive to light.  Neck: Neck supple.  Cardiovascular: Normal rate, regular rhythm, normal heart sounds and intact distal pulses.   Pulmonary/Chest: Effort normal and breath sounds normal.  Abdominal: Soft. Bowel sounds  are normal. She exhibits no distension. There is no tenderness.  Genitourinary: Guaiac positive stool.  No melena only trace stool in the vault. This is not black or tarry in appearance  Musculoskeletal: Normal range of motion. She exhibits no edema.  Neurological: She is alert and oriented to person, place, and time. She has normal strength. Coordination normal. GCS eye subscore is 4. GCS verbal subscore is 5. GCS motor subscore is 6.  Skin: Skin is warm, dry and intact. There is pallor.  Psychiatric: She has a normal mood and affect.    ED Course  Procedures (including critical care time) Labs Review Labs Reviewed  COMPREHENSIVE METABOLIC PANEL - Abnormal; Notable for the following:    Sodium 135 (*)    Chloride 94 (*)    Glucose, Bld 103 (*)    BUN 27 (*)    Creatinine, Ser 1.42 (*)    Total Bilirubin 0.2 (*)    GFR calc non Af Amer 32 (*)    GFR calc Af Amer 37 (*)    Anion gap 16 (*)    All other components within normal limits  CBC WITH DIFFERENTIAL - Abnormal; Notable for the following:    RBC 2.96 (*)    Hemoglobin 6.2 (*)    HCT 20.0 (*)    MCV 67.6 (*)    MCH 20.9 (*)    RDW 16.7 (*)    All other  components within normal limits  I-STAT CHEM 8, ED - Abnormal; Notable for the following:    Sodium 131 (*)    Potassium 3.6 (*)    Chloride 94 (*)    BUN 26 (*)    Creatinine, Ser 1.60 (*)    Glucose, Bld 106 (*)    Hemoglobin 7.5 (*)    HCT 22.0 (*)    All other components within normal limits  POC OCCULT BLOOD, ED - Abnormal; Notable for the following:    Fecal Occult Bld POSITIVE (*)    All other components within normal limits  LIPASE, BLOOD  APTT  PROTIME-INR  COMPREHENSIVE METABOLIC PANEL  CBC WITH DIFFERENTIAL  CBC WITH DIFFERENTIAL  MAGNESIUM  CBC WITH DIFFERENTIAL  CBC WITH DIFFERENTIAL  I-STAT TROPOININ, ED  TYPE AND SCREEN  PREPARE RBC (CROSSMATCH)  ABO/RH    Imaging Review Dg Chest Port 1 View  07/03/2014   CLINICAL DATA:  Anemia, colon  cancer, scheduled for colostomy next week. Possible renal cancer.  EXAM: PORTABLE CHEST - 1 VIEW  COMPARISON:  CT chest dated 06/22/2014.  FINDINGS: Chronic interstitial markings. Mild bibasilar atelectasis. No focal consolidation. No pleural effusion or pneumothorax.  The heart is top-normal in size.  Moderate hiatal hernia.  IMPRESSION: No evidence of acute cardiopulmonary disease.  Moderate hiatal hernia.   Electronically Signed   By: Julian Hy M.D.   On: 07/03/2014 19:18     EKG Interpretation   Date/Time:  Tuesday July 03 2014 19:16:09 EDT Ventricular Rate:  101 PR Interval:  119 QRS Duration: 72 QT Interval:  330 QTC Calculation: 428 R Axis:   24 Text Interpretation:  Sinus tachycardia Posterior infarct, old Nonspecific  repol abnormality, lateral leads no STEMI. NO ischemic changes. Confirmed  by Johnney Killian, MD, Jeannie Done [phone]) on 07/04/2014 12:03:20 AM     CRITICAL CARE Performed by: Charlesetta Shanks   Total critical care time: 30  Critical care time was exclusive of separately billable procedures and treating other patients.  Critical care was necessary to treat or prevent imminent or life-threatening deterioration.  Critical care was time spent personally by me on the following activities: development of treatment plan with patient and/or surrogate as well as nursing, discussions with consultants, evaluation of patient's response to treatment, examination of patient, obtaining history from patient or surrogate, ordering and performing treatments and interventions, ordering and review of laboratory studies, ordering and review of radiographic studies, pulse oximetry and re-evaluation of patient's condition. MDM   Final diagnoses:  GI bleed   The is admitted with significant anemia in new diagnosis of colon cancer.    Charlesetta Shanks, MD 07/04/14 [phone]

## 2014-07-04 NOTE — Progress Notes (Signed)
INITIAL NUTRITION ASSESSMENT  Pt meets criteria for mild/moderate MALNUTRITION in the context of chronic illness as evidenced by >5% weight loss in the past month, <75% intake for > 1 month, and decreased muscle mass.  DOCUMENTATION CODES Per approved criteria  -Non-severe (moderate) malnutrition in the context of chronic illness   INTERVENTION: NPO with diet advancement per MD RD to follow.  NUTRITION DIAGNOSIS: Inadequate oral intake related to inability to eat as evidenced by npo status.   Goal: Diet advancement with intake of meals and supplements to meet >90% estimated needs.  Monitor:  Diet advancement and tolerance, intake, labs, weight trend  Reason for Assessment: MST  80 y.o. female  Admitting Dx: <principal problem not specified>  ASSESSMENT: Patient admitted with anemia secondary to GI bleed.  Colon cancer with cancer found at other sites  10/14: -Per daughter in law, patient with fair intake prior to admit -Severe constipation -UBW >110 lbs.  Recent 25 lb weight loss per chart. -Patient is persian and speaks [place].  Height: Ht Readings from Last 1 Encounters:  07/04/14 4\' 5"  (1.346 m)    Weight: Wt Readings from Last 1 Encounters:  07/04/14 104 lb 11.5 oz (47.5 kg)    Ideal Body Weight: 95 lbs  % Ideal Body Weight: 109  Wt Readings from Last 10 Encounters:  07/04/14 104 lb 11.5 oz (47.5 kg)    Usual Body Weight: >110 lbs  % Usual Body Weight: 95  BMI:  Body mass index is 26.22 kg/(m^2).  Estimated Nutritional Needs: Kcal: 1400-1500 Protein: 55-65 gm Fluid: 1.4-1.5L  Skin: intact  Diet Order: NPO  EDUCATION NEEDS: -No education needs identified at this time   Intake/Output Summary (Last 24 hours) at 07/04/14 1709 Last data filed at 07/04/14 0600  Gross per 24 hour  Intake 696.25 ml  Output    250 ml  Net 446.25 ml     Labs:   Recent Labs Lab 07/03/14 1916 07/03/14 1930 07/04/14 0802  NA 135* 131* 139  K 4.0 3.6* 3.7   CL 94* 94* 101  CO2 25  --  25  BUN 27* 26* 20  CREATININE 1.42* 1.60* 0.91  CALCIUM 10.1  --  9.4  MG  --   --  2.1  GLUCOSE 103* 106* 101*    CBG (last 3)  No results found for this basename: GLUCAP,  in the last 72 hours  Scheduled Meds: . antiseptic oral rinse  7 mL Mouth Rinse BID    Continuous Infusions: . 0.9 % NaCl with KCl 20 mEq / L 75 mL/hr at 07/04/14 0451    Past Medical History  Diagnosis Date  . Colon cancer     History reviewed. No pertinent past surgical history.  Antonieta Iba, RD, LDN Clinical Inpatient Dietitian Pager:  [phone] Weekend and after hours pager:  [phone]

## 2014-07-04 NOTE — Progress Notes (Signed)
UR completed 

## 2014-07-04 NOTE — Progress Notes (Signed)
[facility] PROGRESS NOTE  Maeby Vankleeck BTD:[id number] DOB: [birth year] DOA: 07/03/2014 PCP: No primary provider on file.  Assessment/Plan: Active Problems:   Anemia - pt is s/p transfusion - improved hgb of 10.6 - 2ary to GIB from suspect colon/rectal cancer. [facility] has obtained biopsy results which I have not been able to review. -Will continue to monitor hemoglobin levels    GIB (gastrointestinal bleeding) - From suspected malignancy - General surgery consulted - monitor hgb levels  Renal mass - Discussed with Urology who plans on rounding on patient while in house.  Code Status: Full Family Communication: Daughter and husband Disposition Plan: Pending recommendations from specialist involved.    Consultants:  Urology  CCS  Procedures:  None  Antibiotics:  None  HPI/Subjective: No new complaints. Daughter and family had questions which were answered to their satisfaction.  Objective: Filed Vitals:   07/04/14 1426  BP: 140/70  Pulse: 92  Temp: 98.1 F (36.7 C)  Resp: 16    Intake/Output Summary (Last 24 hours) at 07/04/14 1641 Last data filed at 07/04/14 0600  Gross per 24 hour  Intake 696.25 ml  Output    250 ml  Net 446.25 ml   Filed Weights   07/04/14 0145  Weight: 47.5 kg (104 lb 11.5 oz)    Exam:   General:  Pt in nad, alert and awake  Cardiovascular: rrr, no mrg  Respiratory: cta bl, no wheezes  Abdomen: soft, no rebound tenderness, NT, + bowel sounds  Musculoskeletal: no cyanosis or clubbing   Data Reviewed: Basic Metabolic Panel:  Recent Labs Lab 07/03/14 1916 07/03/14 1930 07/04/14 0802  NA 135* 131* 139  K 4.0 3.6* 3.7  CL 94* 94* 101  CO2 25  --  25  GLUCOSE 103* 106* 101*  BUN 27* 26* 20  CREATININE 1.42* 1.60* 0.91  CALCIUM 10.1  --  9.4  MG  --   --  2.1   Liver Function Tests:  Recent Labs Lab 07/03/14 1916 07/04/14 0802  AST 14 14  ALT 11 9  ALKPHOS 72 65  BILITOT 0.2* 0.9  PROT  7.0 6.4  ALBUMIN 3.6 3.4*    Recent Labs Lab 07/03/14 1916  LIPASE 48   No results found for this basename: AMMONIA,  in the last 168 hours CBC:  Recent Labs Lab 07/03/14 1916 07/03/14 1930 07/04/14 0802 07/04/14 1459  WBC 7.7  --  6.0 6.5  NEUTROABS 4.9  --  4.1 4.2  HGB 6.2* 7.5* 9.5* 10.6*  HCT 20.0* 22.0* 29.1* 32.7*  MCV 67.6*  --  72.2* 73.5*  PLT 350  --  318 331   Cardiac Enzymes: No results found for this basename: CKTOTAL, CKMB, CKMBINDEX, TROPONINI,  in the last 168 hours BNP (last 3 results) No results found for this basename: PROBNP,  in the last 8760 hours CBG: No results found for this basename: GLUCAP,  in the last 168 hours  No results found for this or any previous visit (from the past 240 hour(s)).   Studies: Dg Chest Port 1 View  07/03/2014   CLINICAL DATA:  Anemia, colon cancer, scheduled for colostomy next week. Possible renal cancer.  EXAM: PORTABLE CHEST - 1 VIEW  COMPARISON:  CT chest dated 06/22/2014.  FINDINGS: Chronic interstitial markings. Mild bibasilar atelectasis. No focal consolidation. No pleural effusion or pneumothorax.  The heart is top-normal in size.  Moderate hiatal hernia.  IMPRESSION: No evidence of acute cardiopulmonary disease.  Moderate hiatal hernia.  Electronically Signed   By: Julian Hy M.D.   On: 07/03/2014 19:18    Scheduled Meds: . antiseptic oral rinse  7 mL Mouth Rinse BID   Continuous Infusions: . 0.9 % NaCl with KCl 20 mEq / L 75 mL/hr at 07/04/14 0451    Time spent: > 35 minutes    [name]  [facility] Pager [phone] If 7PM-7AM, please contact night-coverage at [email or web address], password [facility] 07/04/2014, 4:41 PM  LOS: 1 day

## 2014-07-04 NOTE — ED Notes (Signed)
Spoke with Dr Ernestina Patches and pt is okay for medical / surgical,  Does not need telemetry

## 2014-07-05 ENCOUNTER — Ambulatory Visit (INDEPENDENT_AMBULATORY_CARE_PROVIDER_SITE_OTHER): Payer: Self-pay | Admitting: Surgery

## 2014-07-05 DIAGNOSIS — E440 Moderate protein-calorie malnutrition: Secondary | ICD-10-CM | POA: Diagnosis present

## 2014-07-05 LAB — CBC WITH DIFFERENTIAL/PLATELET
BASOS ABS: 0 10*3/uL (ref 0.0–0.1)
BASOS ABS: 0.1 10*3/uL (ref 0.0–0.1)
Basophils Absolute: 0.1 10*3/uL (ref 0.0–0.1)
Basophils Relative: 1 % (ref 0–1)
Basophils Relative: 1 % (ref 0–1)
Basophils Relative: 1 % (ref 0–1)
EOS ABS: 0.4 10*3/uL (ref 0.0–0.7)
EOS ABS: 0.2 10*3/uL (ref 0.0–0.7)
EOS PCT: 6 % — AB (ref 0–5)
EOS PCT: 4 % (ref 0–5)
Eosinophils Absolute: 0.2 10*3/uL (ref 0.0–0.7)
Eosinophils Relative: 3 % (ref 0–5)
HCT: 31.6 % — ABNORMAL LOW (ref 36.0–46.0)
HEMATOCRIT: 31.9 % — AB (ref 36.0–46.0)
HEMATOCRIT: 31.1 % — AB (ref 36.0–46.0)
HEMOGLOBIN: 10.2 g/dL — AB (ref 12.0–15.0)
Hemoglobin: 10 g/dL — ABNORMAL LOW (ref 12.0–15.0)
Hemoglobin: 10.3 g/dL — ABNORMAL LOW (ref 12.0–15.0)
LYMPHS PCT: 20 % (ref 12–46)
Lymphocytes Relative: 15 % (ref 12–46)
Lymphocytes Relative: 15 % (ref 12–46)
Lymphs Abs: 1.4 10*3/uL (ref 0.7–4.0)
Lymphs Abs: 1.1 10*3/uL (ref 0.7–4.0)
Lymphs Abs: 1.3 10*3/uL (ref 0.7–4.0)
MCH: 23.6 pg — AB (ref 26.0–34.0)
MCH: 23.6 pg — AB (ref 26.0–34.0)
MCH: 23.8 pg — ABNORMAL LOW (ref 26.0–34.0)
MCHC: 32 g/dL (ref 30.0–36.0)
MCHC: 32.2 g/dL (ref 30.0–36.0)
MCHC: 32.6 g/dL (ref 30.0–36.0)
MCV: 73.7 fL — ABNORMAL LOW (ref 78.0–100.0)
MCV: 73.3 fL — AB (ref 78.0–100.0)
MCV: 73 fL — AB (ref 78.0–100.0)
MONO ABS: 0.9 10*3/uL (ref 0.1–1.0)
MONO ABS: 0.7 10*3/uL (ref 0.1–1.0)
MONO ABS: 0.7 10*3/uL (ref 0.1–1.0)
MONOS PCT: 13 % — AB (ref 3–12)
Monocytes Relative: 11 % (ref 3–12)
Monocytes Relative: 8 % (ref 3–12)
NEUTROS ABS: 6.5 10*3/uL (ref 1.7–7.7)
Neutro Abs: 4.3 10*3/uL (ref 1.7–7.7)
Neutro Abs: 4.8 10*3/uL (ref 1.7–7.7)
Neutrophils Relative %: 60 % (ref 43–77)
Neutrophils Relative %: 69 % (ref 43–77)
Neutrophils Relative %: 73 % (ref 43–77)
PLATELETS: 333 10*3/uL (ref 150–400)
PLATELETS: 357 10*3/uL (ref 150–400)
PLATELETS: 345 10*3/uL (ref 150–400)
RBC: 4.33 MIL/uL (ref 3.87–5.11)
RBC: 4.24 MIL/uL (ref 3.87–5.11)
RBC: 4.33 MIL/uL (ref 3.87–5.11)
RDW: 20 % — ABNORMAL HIGH (ref 11.5–15.5)
RDW: 20.4 % — AB (ref 11.5–15.5)
RDW: 20.4 % — AB (ref 11.5–15.5)
WBC: 7 10*3/uL (ref 4.0–10.5)
WBC: 6.9 10*3/uL (ref 4.0–10.5)
WBC: 8.8 10*3/uL (ref 4.0–10.5)

## 2014-07-05 LAB — COMPREHENSIVE METABOLIC PANEL
ALBUMIN: 3.4 g/dL — AB (ref 3.5–5.2)
ALT: 9 U/L (ref 0–35)
AST: 17 U/L (ref 0–37)
Alkaline Phosphatase: 68 U/L (ref 39–117)
Anion gap: 11 (ref 5–15)
BUN: 18 mg/dL (ref 6–23)
CALCIUM: 9.8 mg/dL (ref 8.4–10.5)
CO2: 23 mEq/L (ref 19–32)
CREATININE: 0.8 mg/dL (ref 0.50–1.10)
Chloride: 105 mEq/L (ref 96–112)
GFR calc Af Amer: 74 mL/min — ABNORMAL LOW (ref >90)
GFR, EST NON AFRICAN AMERICAN: 64 mL/min — AB (ref >90)
Glucose, Bld: 92 mg/dL (ref 70–99)
Potassium: 4 mEq/L (ref 3.7–5.3)
SODIUM: 139 meq/L (ref 137–147)
Total Bilirubin: 0.5 mg/dL (ref 0.3–1.2)
Total Protein: 6.5 g/dL (ref 6.0–8.3)

## 2014-07-05 LAB — TYPE AND SCREEN
ABO/RH(D): O POS
ANTIBODY SCREEN: NEGATIVE
Unit division: 0
Unit division: 0

## 2014-07-05 MED ORDER — DEXTROSE 5 % IV SOLN
2.0000 g | INTRAVENOUS | Status: AC
  Administered 2014-07-06: 2 g via INTRAVENOUS
  Filled 2014-07-05: qty 2

## 2014-07-05 MED ORDER — BOOST / RESOURCE BREEZE PO LIQD
1.0000 | Freq: Two times a day (BID) | ORAL | Status: DC
  Administered 2014-07-05 (×2): 1 via ORAL

## 2014-07-05 MED ORDER — HEPARIN SODIUM (PORCINE) 5000 UNIT/ML IJ SOLN
5000.0000 [IU] | Freq: Once | INTRAMUSCULAR | Status: AC
  Administered 2014-07-06: 5000 [IU] via SUBCUTANEOUS
  Filled 2014-07-05: qty 1

## 2014-07-05 NOTE — Progress Notes (Signed)
[facility] PROGRESS NOTE  Kirsten Golden IOE:[id number] DOB: 1924/03/21 DOA: 07/03/2014 PCP: No primary provider on file.  Assessment/Plan: Active Problems:   Anemia - pt is s/p transfusion - improved hgb of 10.0 - 2ary to GIB from suspect colon/rectal cancer. [facility] has obtained biopsy which primary care physician shared with biopsy results with family today. -Will continue to monitor hemoglobin levels, no active bleeding reported    GIB (gastrointestinal bleeding) - Reportedly biopsy showed malignancy. Per my discussion with general surgery patient is at high-risk for obstruction secondary to narrowing of the transverse colon secondary to malignancy. As such plans will be to place patient on list for operation most likely 07/06/2014. This was discussed with family  Renal mass - Discussed with Urology was consulted - General surgery and urology to discuss case - Patient to obtain PET scan after operation for staging purposes  Pulmonary nodules - Could represent metastatic carcinoma. Etiology uncertain at this juncture. - Agree with PET scan after discharge for further evaluation.  Malnutrition of moderate degree - Since patient is going to have procedure most likely next a.m. we'll place on clear liquid diet - Agree with nutritional supplementation as recommended by registered dietitian -  Pt meets criteria for mild/moderate MALNUTRITION in the context of chronic illness as evidenced by >5% weight loss in the past month, <75% intake for > 1 month, and decreased muscle mass.     Code Status: Full Family Communication: Daughter and husband Disposition Plan: Pending recommendations from specialist involved.    Consultants:  Urology  CCS  Procedures:  None  Antibiotics:  None  HPI/Subjective: Patient and family had many questions which were answered to their satisfaction. No new complaints reported  Objective: Filed Vitals:   07/05/14 1402  BP:  130/66  Pulse: 95  Temp: 97.9 F (36.6 C)  Resp: 16    Intake/Output Summary (Last 24 hours) at 07/05/14 1632 Last data filed at 07/05/14 1103  Gross per 24 hour  Intake    480 ml  Output      0 ml  Net    480 ml   Filed Weights   07/04/14 0145  Weight: 47.5 kg (104 lb 11.5 oz)    Exam:   General:  Pt in nad, alert and awake  Cardiovascular: rrr, no mrg  Respiratory: cta bl, no wheezes  Abdomen: soft, no rebound tenderness, NT, + bowel sounds  Musculoskeletal: no cyanosis or clubbing   Data Reviewed: Basic Metabolic Panel:  Recent Labs Lab 07/03/14 1916 07/03/14 1930 07/04/14 0802 07/05/14 0525  NA 135* 131* 139 139  K 4.0 3.6* 3.7 4.0  CL 94* 94* 101 105  CO2 25  --  25 23  GLUCOSE 103* 106* 101* 92  BUN 27* 26* 20 18  CREATININE 1.42* 1.60* 0.91 0.80  CALCIUM 10.1  --  9.4 9.8  MG  --   --  2.1  --    Liver Function Tests:  Recent Labs Lab 07/03/14 1916 07/04/14 0802 07/05/14 0525  AST 14 14 17   ALT 11 9 9   ALKPHOS 72 65 68  BILITOT 0.2* 0.9 0.5  PROT 7.0 6.4 6.5  ALBUMIN 3.6 3.4* 3.4*    Recent Labs Lab 07/03/14 1916  LIPASE 48   No results found for this basename: AMMONIA,  in the last 168 hours CBC:  Recent Labs Lab 07/04/14 0802 07/04/14 1459 07/04/14 2325 07/05/14 0525 07/05/14 1436  WBC 6.0 6.5 7.2 7.0 6.9  NEUTROABS 4.1 4.2 5.0  4.3 4.8  HGB 9.5* 10.6* 10.1* 10.2* 10.0*  HCT 29.1* 32.7* 31.0* 31.9* 31.1*  MCV 72.2* 73.5* 72.8* 73.7* 73.3*  PLT 318 331 341 333 357   Cardiac Enzymes: No results found for this basename: CKTOTAL, CKMB, CKMBINDEX, TROPONINI,  in the last 168 hours BNP (last 3 results) No results found for this basename: PROBNP,  in the last 8760 hours CBG: No results found for this basename: GLUCAP,  in the last 168 hours  No results found for this or any previous visit (from the past 240 hour(s)).   Studies: Dg Chest Port 1 View  07/03/2014   CLINICAL DATA:  Anemia, colon cancer, scheduled for  colostomy next week. Possible renal cancer.  EXAM: PORTABLE CHEST - 1 VIEW  COMPARISON:  CT chest dated 06/22/2014.  FINDINGS: Chronic interstitial markings. Mild bibasilar atelectasis. No focal consolidation. No pleural effusion or pneumothorax.  The heart is top-normal in size.  Moderate hiatal hernia.  IMPRESSION: No evidence of acute cardiopulmonary disease.  Moderate hiatal hernia.   Electronically Signed   By: Julian Hy M.D.   On: 07/03/2014 19:18    Scheduled Meds: . antiseptic oral rinse  7 mL Mouth Rinse BID  . [START ON 07/06/2014] cefOXitin  2 g Intravenous On Call to OR  . feeding supplement (RESOURCE BREEZE)  1 Container Oral BID BM  . [START ON 07/06/2014] heparin  5,000 Units Subcutaneous Once   Continuous Infusions:    Time spent: > 35 minutes greater than 50% was spent in counseling and coordination of care for the patient    [name]  [facility] Pager [phone] If 7PM-7AM, please contact night-coverage at [email or web address], password [facility] 07/05/2014, 4:32 PM  LOS: 2 days

## 2014-07-05 NOTE — Progress Notes (Signed)
NUTRITION FOLLOW UP  Intervention:   - Resource Breeze po BID, each supplement provides 250 kcal and 9 grams of protein - RD to follow  Nutrition Dx:   Inadequate oral intake related to inability to eat as evidenced by NPO; improving  Goal:   Pt to meet >/= 90% of their estimated nutrition needs; not met  Monitor:   PO intake, labs, weight trend  Assessment:   Patient admitted with anemia secondary to GI bleed. Colon cancer with cancer found at other sites   10/14:  -Per daughter in law, patient with fair intake prior to admit  -Severe constipation  -UBW >110 lbs. Recent 25 lb weight loss per chart.  -Patient is persian and speaks [place].  10/15: - Spoke with family who were concerned that pt remains NPO. Family says that she was "crying for food because she was so hungry" last night. Diet was advanced and pt was able to eat pasta last night. Pt NPO today for possible surgery. Pt has not been seen by surgery at this time.  - Spoke with RN who contacted MD. Diet advanced to Dysphagia III.   Pt meets criteria for mild/moderate MALNUTRITION in the context of chronic illness as evidenced by >5% weight loss in the past month, <75% intake for > 1 month, and decreased muscle mass.  Height: Ht Readings from Last 1 Encounters:  07/04/14 4' 5"  (1.346 m)    Weight Status:   Wt Readings from Last 1 Encounters:  07/04/14 104 lb 11.5 oz (47.5 kg)    Re-estimated needs:  Kcal: 1400-1500 Protein: 55-65 g Fluid: 1.5 L/day  Skin: intact  Diet Order: Dysphagia   Intake/Output Summary (Last 24 hours) at 07/05/14 1100 Last data filed at 07/04/14 2152  Gross per 24 hour  Intake    240 ml  Output      0 ml  Net    240 ml    Last BM: prior to admission   Labs:   Recent Labs Lab 07/03/14 1916 07/03/14 1930 07/04/14 0802 07/05/14 0525  NA 135* 131* 139 139  K 4.0 3.6* 3.7 4.0  CL 94* 94* 101 105  CO2 25  --  25 23  BUN 27* 26* 20 18  CREATININE 1.42* 1.60* 0.91 0.80   CALCIUM 10.1  --  9.4 9.8  MG  --   --  2.1  --   GLUCOSE 103* 106* 101* 92    CBG (last 3)  No results found for this basename: GLUCAP,  in the last 72 hours  Scheduled Meds: . antiseptic oral rinse  7 mL Mouth Rinse BID    Continuous Infusions: . 0.9 % NaCl with KCl 20 mEq / L 75 mL/hr at 07/05/14 [place], LDN

## 2014-07-05 NOTE — Consult Note (Signed)
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 female patient of 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  She was admitted for bleeding.  Case discussed with Dr. Gaynelle Arabian, Dr. Wendee Beavers and [name] as well as Doctor Michail Sermon.  Will try to get on the schedule for paliative resection tomorrow.    Past Medical History  Diagnosis Date  . Colon cancer     History reviewed. No pertinent past surgical history.  Current Facility-Administered Medications  Medication Dose Route Frequency Provider Last Rate Last Dose  . antiseptic oral rinse (CPC / CETYLPYRIDINIUM CHLORIDE 0.05%) solution 7 mL  7 mL Mouth Rinse BID Shanda Howells, MD   7 mL at 07/05/14 1136  . feeding supplement (RESOURCE BREEZE) (RESOURCE BREEZE) liquid 1 Container  1 Container Oral BID BM Dorann Ou, RD       Review of patient's allergies indicates no known allergies. No family history on file. Social History:   reports that she has never smoked. She does not have any smokeless tobacco history on file. She reports that she does not drink alcohol. Her drug history is not on file.   REVIEW OF SYSTEMS : Negative except for not applicable  Physical Exam:   Blood pressure 135/69, pulse 90, temperature 97.9 F (36.6 C), temperature source Oral, resp. rate 14, height 4' 5"  (1.346 m), weight 104 lb 11.5 oz (47.5 kg), SpO2 100.00%. Body mass index is 26.22 kg/(m^2).  Gen:  WDWN elderly Persian laday NAD  Neurological: Alert and oriented to person, place, and time. Motor and sensory function is grossly intact  LABORATORY RESULTS: Results for orders placed during the hospital encounter of 07/03/14 (from the past 48 hour(s))  COMPREHENSIVE METABOLIC PANEL     Status: Abnormal   Collection Time    07/03/14  7:16 PM      Result Value Ref Range   Sodium 135 (*) 137 - 147 mEq/L   Potassium 4.0   3.7 - 5.3 mEq/L   Chloride 94 (*) 96 - 112 mEq/L   CO2 25  19 - 32 mEq/L   Glucose, Bld 103 (*) 70 - 99 mg/dL   BUN 27 (*) 6 - 23 mg/dL   Creatinine, Ser 1.42 (*) 0.50 - 1.10 mg/dL   Calcium 10.1  8.4 - 10.5 mg/dL   Total Protein 7.0  6.0 - 8.3 g/dL   Albumin 3.6  3.5 - 5.2 g/dL   AST 14  0 - 37 U/L   ALT 11  0 - 35 U/L   Alkaline Phosphatase 72  39 - 117 U/L   Total Bilirubin 0.2 (*) 0.3 - 1.2 mg/dL   GFR calc non Af Amer 32 (*) >90 mL/min   GFR calc Af Amer 37 (*) >90 mL/min   Comment: (NOTE)     The eGFR has been calculated using the CKD EPI equation.     This calculation has not been validated in all clinical situations.     eGFR's persistently <90 mL/min signify possible Chronic Kidney     Disease.   Anion gap 16 (*) 5 - 15  LIPASE, BLOOD     Status: None   Collection Time    07/03/14  7:16 PM      Result Value Ref Range   Lipase 48  11 - 59 U/L  CBC WITH DIFFERENTIAL     Status: Abnormal  Collection Time    07/03/14  7:16 PM      Result Value Ref Range   WBC 7.7  4.0 - 10.5 K/uL   RBC 2.96 (*) 3.87 - 5.11 MIL/uL   Hemoglobin 6.2 (*) 12.0 - 15.0 g/dL   Comment: CRITICAL RESULT CALLED TO, READ BACK BY AND VERIFIED WITH:     SPOKE WITH TEUP,M [id number] [place],N   HCT 20.0 (*) 36.0 - 46.0 %   MCV 67.6 (*) 78.0 - 100.0 fL   MCH 20.9 (*) 26.0 - 34.0 pg   MCHC 31.0  30.0 - 36.0 g/dL   RDW 16.7 (*) 11.5 - 15.5 %   Platelets 350  150 - 400 K/uL   Neutrophils Relative % 63  43 - 77 %   Lymphocytes Relative 22  12 - 46 %   Monocytes Relative 11  3 - 12 %   Eosinophils Relative 3  0 - 5 %   Basophils Relative 1  0 - 1 %   Neutro Abs 4.9  1.7 - 7.7 K/uL   Lymphs Abs 1.7  0.7 - 4.0 K/uL   Monocytes Absolute 0.8  0.1 - 1.0 K/uL   Eosinophils Absolute 0.2  0.0 - 0.7 K/uL   Basophils Absolute 0.1  0.0 - 0.1 K/uL   RBC Morphology POLYCHROMASIA PRESENT     Comment: TARGET CELLS     ELLIPTOCYTES   WBC Morphology TOXIC GRANULATION     Comment: INCREASED BANDS (>20%  BANDS)     MICROCYTES  APTT     Status: None   Collection Time    07/03/14  7:16 PM      Result Value Ref Range   aPTT 28  24 - 37 seconds  PROTIME-INR     Status: None   Collection Time    07/03/14  7:16 PM      Result Value Ref Range   Prothrombin Time 13.9  11.6 - 15.2 seconds   INR 1.06  0.00 - 1.49  TYPE AND SCREEN     Status: None   Collection Time    07/03/14  7:16 PM      Result Value Ref Range   ABO/RH(D) O POS     Antibody Screen NEG     Sample Expiration 07/06/2014     Unit Number [id number]     Blood Component Type RBC LR PHER2     Unit division 00     Status of Unit ISSUED,FINAL     Transfusion Status OK TO TRANSFUSE     Crossmatch Result Compatible     Unit Number [id number]     Blood Component Type RBC LR PHER1     Unit division 00     Status of Unit ISSUED,FINAL     Transfusion Status OK TO TRANSFUSE     Crossmatch Result Compatible    PREPARE RBC (CROSSMATCH)     Status: None   Collection Time    07/03/14  7:21 PM      Result Value Ref Range   Order Confirmation ORDER PROCESSED BY BLOOD BANK    I-STAT TROPOININ, ED     Status: None   Collection Time    07/03/14  7:29 PM      Result Value Ref Range   Troponin i, poc 0.01  0.00 - 0.08 ng/mL   Comment 3            Comment: Due to the release kinetics of cTnI,  a negative result within the first hours     of the onset of symptoms does not rule out     myocardial infarction with certainty.     If myocardial infarction is still suspected,     repeat the test at appropriate intervals.  I-STAT CHEM 8, ED     Status: Abnormal   Collection Time    07/03/14  7:30 PM      Result Value Ref Range   Sodium 131 (*) 137 - 147 mEq/L   Potassium 3.6 (*) 3.7 - 5.3 mEq/L   Chloride 94 (*) 96 - 112 mEq/L   BUN 26 (*) 6 - 23 mg/dL   Creatinine, Ser 1.60 (*) 0.50 - 1.10 mg/dL   Glucose, Bld 106 (*) 70 - 99 mg/dL   Calcium, Ion 1.28  1.13 - 1.30 mmol/L   TCO2 24  0 - 100 mmol/L   Hemoglobin 7.5 (*) 12.0 -  15.0 g/dL   HCT 22.0 (*) 36.0 - 46.0 %  ABO/RH     Status: None   Collection Time    07/03/14  9:58 PM      Result Value Ref Range   ABO/RH(D) O POS    POC OCCULT BLOOD, ED     Status: Abnormal   Collection Time    07/03/14 10:26 PM      Result Value Ref Range   Fecal Occult Bld POSITIVE (*) NEGATIVE  COMPREHENSIVE METABOLIC PANEL     Status: Abnormal   Collection Time    07/04/14  8:02 AM      Result Value Ref Range   Sodium 139  137 - 147 mEq/L   Comment: REPEATED TO VERIFY     DELTA CHECK NOTED   Potassium 3.7  3.7 - 5.3 mEq/L   Chloride 101  96 - 112 mEq/L   CO2 25  19 - 32 mEq/L   Glucose, Bld 101 (*) 70 - 99 mg/dL   BUN 20  6 - 23 mg/dL   Creatinine, Ser 0.91  0.50 - 1.10 mg/dL   Comment: REPEATED TO VERIFY     DELTA CHECK NOTED   Calcium 9.4  8.4 - 10.5 mg/dL   Total Protein 6.4  6.0 - 8.3 g/dL   Albumin 3.4 (*) 3.5 - 5.2 g/dL   AST 14  0 - 37 U/L   ALT 9  0 - 35 U/L   Alkaline Phosphatase 65  39 - 117 U/L   Total Bilirubin 0.9  0.3 - 1.2 mg/dL   GFR calc non Af Amer 55 (*) >90 mL/min   GFR calc Af Amer 63 (*) >90 mL/min   Comment: (NOTE)     The eGFR has been calculated using the CKD EPI equation.     This calculation has not been validated in all clinical situations.     eGFR's persistently <90 mL/min signify possible Chronic Kidney     Disease.   Anion gap 13  5 - 15  CBC WITH DIFFERENTIAL     Status: Abnormal   Collection Time    07/04/14  8:02 AM      Result Value Ref Range   WBC 6.0  4.0 - 10.5 K/uL   RBC 4.03  3.87 - 5.11 MIL/uL   Hemoglobin 9.5 (*) 12.0 - 15.0 g/dL   Comment: REPEATED TO VERIFY     DELTA CHECK NOTED     POST TRANSFUSION SPECIMEN   HCT 29.1 (*) 36.0 - 46.0 %  MCV 72.2 (*) 78.0 - 100.0 fL   MCH 23.6 (*) 26.0 - 34.0 pg   MCHC 32.6  30.0 - 36.0 g/dL   RDW 20.1 (*) 11.5 - 15.5 %   Platelets 318  150 - 400 K/uL   Neutrophils Relative % 69  43 - 77 %   Lymphocytes Relative 18  12 - 46 %   Monocytes Relative 9  3 - 12 %    Eosinophils Relative 3  0 - 5 %   Basophils Relative 1  0 - 1 %   Neutro Abs 4.1  1.7 - 7.7 K/uL   Lymphs Abs 1.1  0.7 - 4.0 K/uL   Monocytes Absolute 0.5  0.1 - 1.0 K/uL   Eosinophils Absolute 0.2  0.0 - 0.7 K/uL   Basophils Absolute 0.1  0.0 - 0.1 K/uL  MAGNESIUM     Status: None   Collection Time    07/04/14  8:02 AM      Result Value Ref Range   Magnesium 2.1  1.5 - 2.5 mg/dL  CBC WITH DIFFERENTIAL     Status: Abnormal   Collection Time    07/04/14  2:59 PM      Result Value Ref Range   WBC 6.5  4.0 - 10.5 K/uL   RBC 4.45  3.87 - 5.11 MIL/uL   Hemoglobin 10.6 (*) 12.0 - 15.0 g/dL   HCT 32.7 (*) 36.0 - 46.0 %   MCV 73.5 (*) 78.0 - 100.0 fL   MCH 23.8 (*) 26.0 - 34.0 pg   MCHC 32.4  30.0 - 36.0 g/dL   RDW 19.6 (*) 11.5 - 15.5 %   Platelets 331  150 - 400 K/uL   Neutrophils Relative % 63  43 - 77 %   Neutro Abs 4.2  1.7 - 7.7 K/uL   Lymphocytes Relative 21  12 - 46 %   Lymphs Abs 1.3  0.7 - 4.0 K/uL   Monocytes Relative 12  3 - 12 %   Monocytes Absolute 0.8  0.1 - 1.0 K/uL   Eosinophils Relative 3  0 - 5 %   Eosinophils Absolute 0.2  0.0 - 0.7 K/uL   Basophils Relative 1  0 - 1 %   Basophils Absolute 0.0  0.0 - 0.1 K/uL  CBC WITH DIFFERENTIAL     Status: Abnormal   Collection Time    07/04/14 11:25 PM      Result Value Ref Range   WBC 7.2  4.0 - 10.5 K/uL   RBC 4.26  3.87 - 5.11 MIL/uL   Hemoglobin 10.1 (*) 12.0 - 15.0 g/dL   HCT 31.0 (*) 36.0 - 46.0 %   MCV 72.8 (*) 78.0 - 100.0 fL   MCH 23.7 (*) 26.0 - 34.0 pg   MCHC 32.6  30.0 - 36.0 g/dL   RDW 19.8 (*) 11.5 - 15.5 %   Platelets 341  150 - 400 K/uL   Neutrophils Relative % 68  43 - 77 %   Neutro Abs 5.0  1.7 - 7.7 K/uL   Lymphocytes Relative 19  12 - 46 %   Lymphs Abs 1.3  0.7 - 4.0 K/uL   Monocytes Relative 8  3 - 12 %   Monocytes Absolute 0.6  0.1 - 1.0 K/uL   Eosinophils Relative 4  0 - 5 %   Eosinophils Absolute 0.3  0.0 - 0.7 K/uL   Basophils Relative 1  0 - 1 %   Basophils Absolute 0.0  0.0 - 0.1  K/uL  COMPREHENSIVE METABOLIC PANEL     Status: Abnormal   Collection Time    07/05/14  5:25 AM      Result Value Ref Range   Sodium 139  137 - 147 mEq/L   Potassium 4.0  3.7 - 5.3 mEq/L   Chloride 105  96 - 112 mEq/L   CO2 23  19 - 32 mEq/L   Glucose, Bld 92  70 - 99 mg/dL   BUN 18  6 - 23 mg/dL   Creatinine, Ser 0.80  0.50 - 1.10 mg/dL   Calcium 9.8  8.4 - 10.5 mg/dL   Total Protein 6.5  6.0 - 8.3 g/dL   Albumin 3.4 (*) 3.5 - 5.2 g/dL   AST 17  0 - 37 U/L   ALT 9  0 - 35 U/L   Alkaline Phosphatase 68  39 - 117 U/L   Total Bilirubin 0.5  0.3 - 1.2 mg/dL   GFR calc non Af Amer 64 (*) >90 mL/min   GFR calc Af Amer 74 (*) >90 mL/min   Comment: (NOTE)     The eGFR has been calculated using the CKD EPI equation.     This calculation has not been validated in all clinical situations.     eGFR's persistently <90 mL/min signify possible Chronic Kidney     Disease.   Anion gap 11  5 - 15  CBC WITH DIFFERENTIAL     Status: Abnormal   Collection Time    07/05/14  5:25 AM      Result Value Ref Range   WBC 7.0  4.0 - 10.5 K/uL   RBC 4.33  3.87 - 5.11 MIL/uL   Hemoglobin 10.2 (*) 12.0 - 15.0 g/dL   HCT 31.9 (*) 36.0 - 46.0 %   MCV 73.7 (*) 78.0 - 100.0 fL   MCH 23.6 (*) 26.0 - 34.0 pg   MCHC 32.0  30.0 - 36.0 g/dL   RDW 20.0 (*) 11.5 - 15.5 %   Platelets 333  150 - 400 K/uL   Neutrophils Relative % 60  43 - 77 %   Neutro Abs 4.3  1.7 - 7.7 K/uL   Lymphocytes Relative 20  12 - 46 %   Lymphs Abs 1.4  0.7 - 4.0 K/uL   Monocytes Relative 13 (*) 3 - 12 %   Monocytes Absolute 0.9  0.1 - 1.0 K/uL   Eosinophils Relative 6 (*) 0 - 5 %   Eosinophils Absolute 0.4  0.0 - 0.7 K/uL   Basophils Relative 1  0 - 1 %   Basophils Absolute 0.1  0.0 - 0.1 K/uL     RADIOLOGY RESULTS: Dg Chest Port 1 View  07/03/2014   CLINICAL DATA:  Anemia, colon cancer, scheduled for colostomy next week. Possible renal cancer.  EXAM: PORTABLE CHEST - 1 VIEW  COMPARISON:  CT chest dated 06/22/2014.  FINDINGS:  Chronic interstitial markings. Mild bibasilar atelectasis. No focal consolidation. No pleural effusion or pneumothorax.  The heart is top-normal in size.  Moderate hiatal hernia.  IMPRESSION: No evidence of acute cardiopulmonary disease.  Moderate hiatal hernia.   Electronically Signed   By: Julian Hy M.D.   On: 07/03/2014 19:18    Problem List: Patient Active Problem List   Diagnosis Date Noted  . Malnutrition of moderate degree 07/05/2014  . Renal mass 07/04/2014  . Anemia 07/03/2014  . GIB (gastrointestinal bleeding) 07/03/2014  . 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      Matt B. Hassell Done, MD, [facility] [phone] beeper [phone]  07/05/2014 11:41 AM

## 2014-07-06 ENCOUNTER — Encounter (HOSPITAL_COMMUNITY): Payer: Medicaid Other | Admitting: *Deleted

## 2014-07-06 ENCOUNTER — Ambulatory Visit (HOSPITAL_COMMUNITY): Payer: Medicaid Other

## 2014-07-06 ENCOUNTER — Inpatient Hospital Stay (HOSPITAL_COMMUNITY): Payer: Medicaid Other | Admitting: *Deleted

## 2014-07-06 ENCOUNTER — Encounter (HOSPITAL_COMMUNITY): Payer: Self-pay | Admitting: *Deleted

## 2014-07-06 ENCOUNTER — Encounter (HOSPITAL_COMMUNITY)
Admission: EM | Disposition: A | Discharge status: Hospice | Payer: Self-pay | Source: Home / Self Care | Attending: Family Medicine

## 2014-07-06 DIAGNOSIS — Z9049 Acquired absence of other specified parts of digestive tract: Secondary | ICD-10-CM

## 2014-07-06 HISTORY — PX: LAPAROSCOPIC PARTIAL COLECTOMY: SHX5907

## 2014-07-06 LAB — CBC WITH DIFFERENTIAL/PLATELET
BASOS PCT: 1 % (ref 0–1)
Basophils Absolute: 0.1 10*3/uL (ref 0.0–0.1)
Eosinophils Absolute: 0.2 10*3/uL (ref 0.0–0.7)
Eosinophils Relative: 3 % (ref 0–5)
HCT: 36.6 % (ref 36.0–46.0)
Hemoglobin: 11.6 g/dL — ABNORMAL LOW (ref 12.0–15.0)
Lymphocytes Relative: 18 % (ref 12–46)
Lymphs Abs: 1.8 10*3/uL (ref 0.7–4.0)
MCH: 23.6 pg — ABNORMAL LOW (ref 26.0–34.0)
MCHC: 31.7 g/dL (ref 30.0–36.0)
MCV: 74.5 fL — ABNORMAL LOW (ref 78.0–100.0)
Monocytes Absolute: 0.9 10*3/uL (ref 0.1–1.0)
Monocytes Relative: 9 % (ref 3–12)
NEUTROS PCT: 69 % (ref 43–77)
Neutro Abs: 6.6 10*3/uL (ref 1.7–7.7)
Platelets: 379 10*3/uL (ref 150–400)
RBC: 4.91 MIL/uL (ref 3.87–5.11)
RDW: 20.5 % — AB (ref 11.5–15.5)
WBC: 9.6 10*3/uL (ref 4.0–10.5)

## 2014-07-06 LAB — COMPREHENSIVE METABOLIC PANEL
ALK PHOS: 79 U/L (ref 39–117)
ALT: 12 U/L (ref 0–35)
AST: 18 U/L (ref 0–37)
Albumin: 3.9 g/dL (ref 3.5–5.2)
Anion gap: 15 (ref 5–15)
BUN: 14 mg/dL (ref 6–23)
CALCIUM: 10.3 mg/dL (ref 8.4–10.5)
CO2: 22 meq/L (ref 19–32)
Chloride: 101 mEq/L (ref 96–112)
Creatinine, Ser: 0.76 mg/dL (ref 0.50–1.10)
GFR, EST AFRICAN AMERICAN: 85 mL/min — AB (ref >90)
GFR, EST NON AFRICAN AMERICAN: 73 mL/min — AB (ref >90)
GLUCOSE: 127 mg/dL — AB (ref 70–99)
Potassium: 3.9 mEq/L (ref 3.7–5.3)
Sodium: 138 mEq/L (ref 137–147)
Total Bilirubin: 0.8 mg/dL (ref 0.3–1.2)
Total Protein: 7.5 g/dL (ref 6.0–8.3)

## 2014-07-06 LAB — CBC
HCT: 35.5 % — ABNORMAL LOW (ref 36.0–46.0)
Hemoglobin: 11.3 g/dL — ABNORMAL LOW (ref 12.0–15.0)
MCH: 23.6 pg — ABNORMAL LOW (ref 26.0–34.0)
MCHC: 31.8 g/dL (ref 30.0–36.0)
MCV: 74.1 fL — AB (ref 78.0–100.0)
PLATELETS: 365 10*3/uL (ref 150–400)
RBC: 4.79 MIL/uL (ref 3.87–5.11)
RDW: 20.5 % — ABNORMAL HIGH (ref 11.5–15.5)
WBC: 12.1 10*3/uL — ABNORMAL HIGH (ref 4.0–10.5)

## 2014-07-06 LAB — SURGICAL PCR SCREEN
MRSA, PCR: NEGATIVE
Staphylococcus aureus: NEGATIVE

## 2014-07-06 LAB — CREATININE, SERUM
Creatinine, Ser: 0.79 mg/dL (ref 0.50–1.10)
GFR, EST AFRICAN AMERICAN: 84 mL/min — AB (ref >90)
GFR, EST NON AFRICAN AMERICAN: 72 mL/min — AB (ref >90)

## 2014-07-06 SURGERY — LAPAROSCOPIC PARTIAL COLECTOMY
Anesthesia: General | Site: Abdomen

## 2014-07-06 MED ORDER — CISATRACURIUM BESYLATE (PF) 10 MG/5ML IV SOLN
INTRAVENOUS | Status: DC | PRN
  Administered 2014-07-06 (×2): 2 mg via INTRAVENOUS
  Administered 2014-07-06: 4 mg via INTRAVENOUS
  Administered 2014-07-06: 2 mg via INTRAVENOUS

## 2014-07-06 MED ORDER — ONDANSETRON HCL 4 MG/2ML IJ SOLN: 4.0000 mg | Freq: Four times a day (QID) | INTRAMUSCULAR | Status: DC | PRN

## 2014-07-06 MED ORDER — LACTATED RINGERS IV SOLN
INTRAVENOUS | Status: DC | PRN
  Administered 2014-07-06 (×2): via INTRAVENOUS

## 2014-07-06 MED ORDER — HYDROCODONE-ACETAMINOPHEN 5-325 MG PO TABS: 1.0000 | ORAL_TABLET | ORAL | Status: DC | PRN

## 2014-07-06 MED ORDER — ROCURONIUM BROMIDE 100 MG/10ML IV SOLN
INTRAVENOUS | Status: AC
  Filled 2014-07-06: qty 1

## 2014-07-06 MED ORDER — KCL IN DEXTROSE-NACL 20-5-0.45 MEQ/L-%-% IV SOLN
INTRAVENOUS | Status: DC
  Administered 2014-07-06 – 2014-07-08 (×3): via INTRAVENOUS
  Filled 2014-07-06 (×6): qty 1000

## 2014-07-06 MED ORDER — LABETALOL HCL 5 MG/ML IV SOLN
INTRAVENOUS | Status: AC
  Filled 2014-07-06: qty 4

## 2014-07-06 MED ORDER — NEOSTIGMINE METHYLSULFATE 10 MG/10ML IV SOLN
INTRAVENOUS | Status: DC | PRN
  Administered 2014-07-06: 3 mg via INTRAVENOUS

## 2014-07-06 MED ORDER — LACTATED RINGERS IV SOLN
INTRAVENOUS | Status: DC
  Administered 2014-07-06: 1000 mL via INTRAVENOUS
  Administered 2014-07-06: 15:00:00 via INTRAVENOUS

## 2014-07-06 MED ORDER — DEXAMETHASONE SODIUM PHOSPHATE 10 MG/ML IJ SOLN
INTRAMUSCULAR | Status: DC | PRN
  Administered 2014-07-06: 5 mg via INTRAVENOUS

## 2014-07-06 MED ORDER — LABETALOL HCL 5 MG/ML IV SOLN
INTRAVENOUS | Status: DC | PRN
  Administered 2014-07-06 (×2): 2.5 mg via INTRAVENOUS

## 2014-07-06 MED ORDER — FLEET ENEMA 7-19 GM/118ML RE ENEM
1.0000 | ENEMA | Freq: Once | RECTAL | Status: AC
  Administered 2014-07-06: 1 via RECTAL
  Filled 2014-07-06: qty 1

## 2014-07-06 MED ORDER — HYDROMORPHONE HCL 1 MG/ML IJ SOLN
INTRAMUSCULAR | Status: AC
  Filled 2014-07-06: qty 1

## 2014-07-06 MED ORDER — METOCLOPRAMIDE HCL 5 MG/ML IJ SOLN
INTRAMUSCULAR | Status: DC | PRN
  Administered 2014-07-06: 5 mg via INTRAVENOUS

## 2014-07-06 MED ORDER — LACTATED RINGERS IR SOLN
Status: DC | PRN
  Administered 2014-07-06: 1000 mL

## 2014-07-06 MED ORDER — GLYCOPYRROLATE 0.2 MG/ML IJ SOLN
INTRAMUSCULAR | Status: DC | PRN
  Administered 2014-07-06: 0.4 mg via INTRAVENOUS

## 2014-07-06 MED ORDER — LIDOCAINE HCL (CARDIAC) 20 MG/ML IV SOLN
INTRAVENOUS | Status: DC | PRN
  Administered 2014-07-06: 40 mg via INTRAVENOUS

## 2014-07-06 MED ORDER — MIDAZOLAM HCL 2 MG/2ML IJ SOLN
INTRAMUSCULAR | Status: AC
  Filled 2014-07-06: qty 2

## 2014-07-06 MED ORDER — PROPOFOL 10 MG/ML IV BOLUS
INTRAVENOUS | Status: AC
  Filled 2014-07-06: qty 20

## 2014-07-06 MED ORDER — ONDANSETRON HCL 4 MG PO TABS: 4.0000 mg | ORAL_TABLET | Freq: Four times a day (QID) | ORAL | Status: DC | PRN

## 2014-07-06 MED ORDER — MORPHINE SULFATE 2 MG/ML IJ SOLN
1.0000 mg | INTRAMUSCULAR | Status: DC | PRN
Start: 2014-07-06 — End: 2014-07-14
  Administered 2014-07-06 – 2014-07-11 (×9): 1 mg via INTRAVENOUS
  Filled 2014-07-06 (×9): qty 1

## 2014-07-06 MED ORDER — SUCCINYLCHOLINE CHLORIDE 20 MG/ML IJ SOLN
INTRAMUSCULAR | Status: DC | PRN
  Administered 2014-07-06: 100 mg via INTRAVENOUS

## 2014-07-06 MED ORDER — HEPARIN SODIUM (PORCINE) 5000 UNIT/ML IJ SOLN
5000.0000 [IU] | Freq: Three times a day (TID) | INTRAMUSCULAR | Status: DC
  Administered 2014-07-06 – 2014-07-10 (×11): 5000 [IU] via SUBCUTANEOUS
  Filled 2014-07-06 (×12): qty 1

## 2014-07-06 MED ORDER — BUPIVACAINE LIPOSOME 1.3 % IJ SUSP
20.0000 mL | Freq: Once | INTRAMUSCULAR | Status: AC
  Administered 2014-07-06: 20 mL
  Filled 2014-07-06: qty 20

## 2014-07-06 MED ORDER — HYDROMORPHONE HCL 1 MG/ML IJ SOLN
0.2500 mg | INTRAMUSCULAR | Status: DC | PRN
  Administered 2014-07-06: 0.25 mg via INTRAVENOUS

## 2014-07-06 MED ORDER — FENTANYL CITRATE 0.05 MG/ML IJ SOLN
INTRAMUSCULAR | Status: AC
  Filled 2014-07-06: qty 2

## 2014-07-06 MED ORDER — PROPOFOL 10 MG/ML IV BOLUS
INTRAVENOUS | Status: DC | PRN
  Administered 2014-07-06: 90 mg via INTRAVENOUS

## 2014-07-06 MED ORDER — DEXTROSE 5 % IV SOLN
INTRAVENOUS | Status: AC
  Filled 2014-07-06: qty 2

## 2014-07-06 MED ORDER — FENTANYL CITRATE 0.05 MG/ML IJ SOLN
INTRAMUSCULAR | Status: AC
  Filled 2014-07-06: qty 5

## 2014-07-06 MED ORDER — FENTANYL CITRATE 0.05 MG/ML IJ SOLN
INTRAMUSCULAR | Status: DC | PRN
  Administered 2014-07-06: 25 ug via INTRAVENOUS
  Administered 2014-07-06: 50 ug via INTRAVENOUS
  Administered 2014-07-06 (×2): 25 ug via INTRAVENOUS
  Administered 2014-07-06 (×2): 50 ug via INTRAVENOUS
  Administered 2014-07-06 (×3): 25 ug via INTRAVENOUS

## 2014-07-06 MED ORDER — LIDOCAINE HCL (CARDIAC) 20 MG/ML IV SOLN
INTRAVENOUS | Status: AC
  Filled 2014-07-06: qty 5

## 2014-07-06 MED ORDER — ONDANSETRON HCL 4 MG/2ML IJ SOLN
INTRAMUSCULAR | Status: AC
  Filled 2014-07-06: qty 2

## 2014-07-06 SURGICAL SUPPLY — 68 items
APPLIER CLIP 5 13 M/L LIGAMAX5 (MISCELLANEOUS)
APPLIER CLIP ROT 10 11.4 M/L (STAPLE) ×3
BLADE EXTENDED COATED 6.5IN (ELECTRODE) ×3 IMPLANT
BLADE HEX COATED 2.75 (ELECTRODE) ×3 IMPLANT
BLADE SURG SZ10 CARB STEEL (BLADE) ×3 IMPLANT
CABLE HIGH FREQUENCY MONO STRZ (ELECTRODE) ×3 IMPLANT
CANISTER SUCTION 2500CC (MISCELLANEOUS) ×3 IMPLANT
CELLS DAT CNTRL 66122 CELL SVR (MISCELLANEOUS) ×1 IMPLANT
CLIP APPLIE 5 13 M/L LIGAMAX5 (MISCELLANEOUS) IMPLANT
CLIP APPLIE ROT 10 11.4 M/L (STAPLE) ×1 IMPLANT
COVER MAYO STAND STRL (DRAPES) ×3 IMPLANT
DECANTER SPIKE VIAL GLASS SM (MISCELLANEOUS) ×3 IMPLANT
DRAIN CHANNEL 19F RND (DRAIN) IMPLANT
DRAPE LAPAROSCOPIC ABDOMINAL (DRAPES) ×3 IMPLANT
DRAPE SHEET LG 3/4 BI-LAMINATE (DRAPES) ×3 IMPLANT
DRAPE WARM FLUID 44X44 (DRAPE) ×3 IMPLANT
DRSG OPSITE POSTOP 3X4 (GAUZE/BANDAGES/DRESSINGS) ×3 IMPLANT
ELECT REM PT RETURN 9FT ADLT (ELECTROSURGICAL) ×3
ELECTRODE REM PT RTRN 9FT ADLT (ELECTROSURGICAL) ×1 IMPLANT
GAUZE SPONGE 4X4 12PLY STRL (GAUZE/BANDAGES/DRESSINGS) ×3 IMPLANT
GLOVE BIOGEL M 8.0 STRL (GLOVE) ×6 IMPLANT
GLOVE BIOGEL PI IND STRL 7.0 (GLOVE) ×3 IMPLANT
GLOVE BIOGEL PI INDICATOR 7.0 (GLOVE) ×6
GOWN SPEC L4 XLG W/TWL (GOWN DISPOSABLE) ×6 IMPLANT
GOWN STRL REUS W/TWL LRG LVL3 (GOWN DISPOSABLE) ×6 IMPLANT
GOWN STRL REUS W/TWL XL LVL3 (GOWN DISPOSABLE) ×24 IMPLANT
KIT BASIN OR (CUSTOM PROCEDURE TRAY) ×3 IMPLANT
LEGGING LITHOTOMY PAIR STRL (DRAPES) IMPLANT
LIGASURE IMPACT 36 18CM CVD LR (INSTRUMENTS) ×3 IMPLANT
NS IRRIG 1000ML POUR BTL (IV SOLUTION) ×3 IMPLANT
PENCIL BUTTON HOLSTER BLD 10FT (ELECTRODE) ×3 IMPLANT
RELOAD PROXIMATE 75MM BLUE (ENDOMECHANICALS) ×6 IMPLANT
RTRCTR WOUND ALEXIS 18CM MED (MISCELLANEOUS) ×3
SCISSORS LAP 5X35 DISP (ENDOMECHANICALS) ×3 IMPLANT
SEALER TISSUE G2 CVD JAW 35 (ENDOMECHANICALS) IMPLANT
SEALER TISSUE G2 CVD JAW 45CM (ENDOMECHANICALS)
SET IRRIG TUBING LAPAROSCOPIC (IRRIGATION / IRRIGATOR) ×3 IMPLANT
SHEARS HARMONIC ACE PLUS 36CM (ENDOMECHANICALS) ×3 IMPLANT
SOLUTION ANTI FOG 6CC (MISCELLANEOUS) ×3 IMPLANT
SPONGE LAP 18X18 X RAY DECT (DISPOSABLE) IMPLANT
STAPLER PROXIMATE 75MM BLUE (STAPLE) ×3 IMPLANT
STAPLER VISISTAT 35W (STAPLE) ×3 IMPLANT
SUCTION POOLE TIP (SUCTIONS) ×3 IMPLANT
SUT PDS AB 1 CTX 36 (SUTURE) IMPLANT
SUT PDS AB 1 TP1 96 (SUTURE) IMPLANT
SUT PROLENE 2 0 KS (SUTURE) IMPLANT
SUT SILK 2 0 (SUTURE) ×2
SUT SILK 2 0 SH CR/8 (SUTURE) ×6 IMPLANT
SUT SILK 2-0 18XBRD TIE 12 (SUTURE) ×1 IMPLANT
SUT SILK 3 0 (SUTURE) ×2
SUT SILK 3 0 SH CR/8 (SUTURE) ×3 IMPLANT
SUT SILK 3-0 18XBRD TIE 12 (SUTURE) ×1 IMPLANT
SUT VIC AB 2-0 SH 18 (SUTURE) ×3 IMPLANT
SUT VICRYL 2 0 18  UND BR (SUTURE) ×4
SUT VICRYL 2 0 18 UND BR (SUTURE) ×2 IMPLANT
SYR 30ML LL (SYRINGE) IMPLANT
SYR BULB IRRIGATION 50ML (SYRINGE) ×3 IMPLANT
SYS LAPSCP GELPORT 120MM (MISCELLANEOUS)
SYSTEM LAPSCP GELPORT 120MM (MISCELLANEOUS) IMPLANT
TOWEL OR 17X26 10 PK STRL BLUE (TOWEL DISPOSABLE) ×6 IMPLANT
TRAY FOLEY CATH 14FRSI W/METER (CATHETERS) ×3 IMPLANT
TRAY LAPAROSCOPIC (CUSTOM PROCEDURE TRAY) ×3 IMPLANT
TROCAR XCEL BLUNT TIP 100MML (ENDOMECHANICALS) IMPLANT
TROCAR XCEL NON-BLD 11X100MML (ENDOMECHANICALS) IMPLANT
TROCAR XCEL NON-BLD 5MMX100MML (ENDOMECHANICALS) IMPLANT
TUBING FILTER THERMOFLATOR (ELECTROSURGICAL) ×3 IMPLANT
YANKAUER SUCT BULB TIP 10FT TU (MISCELLANEOUS) ×3 IMPLANT
YANKAUER SUCT BULB TIP NO VENT (SUCTIONS) ×3 IMPLANT

## 2014-07-06 NOTE — Anesthesia Postprocedure Evaluation (Signed)
  Anesthesia Post-op Note  Patient: Kirsten Golden  Procedure(s) Performed: Procedure(s) (LRB): LAPAROSCOPIC ASSISTED TRANSVERSE COLECTOMY (N/A)  Patient Location: PACU  Anesthesia Type: General  Level of Consciousness: awake and alert   Airway and Oxygen Therapy: Patient Spontanous Breathing  Post-op Pain: mild  Post-op Assessment: Post-op Vital signs reviewed, Patient's Cardiovascular Status Stable, Respiratory Function Stable, Patent Airway and No signs of Nausea or vomiting  Last Vitals:  Filed Vitals:   07/06/14 1501  BP: 158/70  Pulse: 98  Temp: 36.4 C  Resp: 18    Post-op Vital Signs: stable   Complications: No apparent anesthesia complications

## 2014-07-06 NOTE — Op Note (Signed)
Surgeon: Kaylyn Lim, MD, FACS  Asst:  none  Anes:  Gen. 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  After anesthesia was administered and the patient was prepped a timeout was performed.  Access to the abdomen was achieved with a 5 mm Optiview through the left upper quadrant. 3 other 5 mm ports were used and placed in the left lower quadrant and 2 in the midline which were subsequently incorporated into the midline incision. These were above and below the umbilicus. Using harmonic scalpel and the laparoscope I mobilize the left colon up to the spleen and then did a complete mobilization of the splenic flexure. The tumor had been marked by Dr. Watt Climes and it was be readily identified in the splenic flexure along with a fungating mass that was appearing to break through the serosal surface and produce a near obstructing colon cancer. This was taken down from the anterior bowel wall where was stuck. Following complete mobilization which was done without bleeding using the harmonic scalpel I was able to pull everything to the midline. At that point I opened the abdomen through the small incision slightly above and below the umbilicus and inserted the wound protector. I was able to exteriorize the bowel and I was able to perform a tension-free anastomosis resecting the bowel in the proximal transverse colon and then in the proximal sigmoid colon. After stapling with the GIA I went through the mesentery with the LigaSure.  The colon tenia were placed in apposition using a suture pulling them together. I opened into both segments of bowel and inserted the GIA. We  clamped this and held for 20 seconds and fired it creating the anastomosis. The common defect was closed in 2 layers with 4-0 PDS and the inside in a running canal fashion and with interrupted 20 silks on the outside approximating the seromuscular layers. The broad mesenteric defect was left alone.  When I surveyed the liver there may be possible faint metastatic deposits in the liver but this was not obvious and because this appears to be advanced disease I did not try any other invasive approaches. In mobilizing the splenic flexure didn't see the anterior part of the introitus the fascia but did not see any fungating masses in the kidney on the left side.  I irrigated milliliters saline. There essentially been no spillage at all and I did prep the opened ends of the colon that I was closing with Betadine.  We follow the protocol and changed our gowns gloves and drapes and then closed the fascia with interrupted #1 Novafil. The wound fascia was injected with Exparel and then the skin was closed with a stapler. The patient are the procedure well was taken to recovery in satisfactory condition.    Kirsten Golden Done, [place], [facility], [place]

## 2014-07-06 NOTE — Progress Notes (Addendum)
Pt's daughter states that pt is unable to fall asleep and is making statements that sound like the pt is confused. Pt was incontinent in the bed. Pt's vitals were taken BP was found to be 188/86. Baltazar Najjar NP on call notified. Awaiting orders. Noreene Larsson RN, BSN  0530 Baltazar Najjar NP responded after 2nd page to state that BP should be rechecked this AM. BP 162/90 manually. Pt resting with eyes closed in room, though pt's daughter states that pt has not slept at all overnight. Noreene Larsson RN, BSN

## 2014-07-06 NOTE — Progress Notes (Signed)
  Subjective: The patient reports abdominal  Discomfort RUQ asnd LUQ. Np flank pain or hematuria.   Objective: Vital signs in last 24 hours: Temp:  [97.5 F (36.4 C)-98.4 F (36.9 C)] 97.5 F (36.4 C) (10/16 0323) Pulse Rate:  [95-100] 100 (10/16 0323) Resp:  [16-20] 20 (10/16 0323) BP: (130-188)/(66-90) 162/90 mmHg (10/16 0559) SpO2:  [97 %-100 %] 100 % (10/16 0323)A  Intake/Output from previous day: 10/15 0701 - 10/16 0700 In: 240 [P.O.:240] Out: -  Intake/Output this shift:    Past Medical History  Diagnosis Date  . Colon cancer     Physical Exam:  Lungs - Normal respiratory effort, chest expands symmetrically.  Abdomen - Soft, non-tender & non-distended.No CVA pain.   Lab Results:  Recent Labs  07/05/14 1436 07/05/14 2311 07/06/14 0429  WBC 6.9 8.8 9.6  HGB 10.0* 10.3* 11.6*  HCT 31.1* 31.6* 36.6   BMET  Recent Labs  07/05/14 0525 07/06/14 0429  NA 139 138  K 4.0 3.9  CL 105 101  CO2 23 22  GLUCOSE 92 127*  BUN 18 14  CREATININE 0.80 0.76  CALCIUM 9.8 10.3   No results found for this basename: LABURIN,  in the last 72 hours No results found for this or any previous visit.  Studies/Results: 1. LUP RCC: pt's daughters and husband do not want discussed with Kirsten Golden. Asymptomatic. She has small pulmonary nodules found on CT. ? Mets fro, kidney vs colon vs benign.  2. Obstructing colon cancer. symptomatic  Assessment: obsgtructing colon cancer LUP RCC Possible pulmonary mets.  Plan: Palliative colon surgery today.  Will follow over weekend.   Kirsten Golden I 07/06/2014, 8:35 AM

## 2014-07-06 NOTE — H&P (View-Only) (Signed)
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 female patient of 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  She was admitted for bleeding.  Case discussed with Dr. Gaynelle Arabian, Dr. Wendee Beavers and [name] as well as Doctor Michail Sermon.  Will try to get on the schedule for paliative resection tomorrow.    Past Medical History  Diagnosis Date  . Colon cancer     History reviewed. No pertinent past surgical history.  Current Facility-Administered Medications  Medication Dose Route Frequency Provider Last Rate Last Dose  . antiseptic oral rinse (CPC / CETYLPYRIDINIUM CHLORIDE 0.05%) solution 7 mL  7 mL Mouth Rinse BID Shanda Howells, MD   7 mL at 07/05/14 1136  . feeding supplement (RESOURCE BREEZE) (RESOURCE BREEZE) liquid 1 Container  1 Container Oral BID BM Dorann Ou, RD       Review of patient's allergies indicates no known allergies. No family history on file. Social History:   reports that she has never smoked. She does not have any smokeless tobacco history on file. She reports that she does not drink alcohol. Her drug history is not on file.   REVIEW OF SYSTEMS : Negative except for not applicable  Physical Exam:   Blood pressure 135/69, pulse 90, temperature 97.9 F (36.6 C), temperature source Oral, resp. rate 14, height 4' 5"  (1.346 m), weight 104 lb 11.5 oz (47.5 kg), SpO2 100.00%. Body mass index is 26.22 kg/(m^2).  Gen:  WDWN elderly Persian laday NAD  Neurological: Alert and oriented to person, place, and time. Motor and sensory function is grossly intact  LABORATORY RESULTS: Results for orders placed during the hospital encounter of 07/03/14 (from the past 48 hour(s))  COMPREHENSIVE METABOLIC PANEL     Status: Abnormal   Collection Time    07/03/14  7:16 PM      Result Value Ref Range   Sodium 135 (*) 137 - 147 mEq/L   Potassium 4.0   3.7 - 5.3 mEq/L   Chloride 94 (*) 96 - 112 mEq/L   CO2 25  19 - 32 mEq/L   Glucose, Bld 103 (*) 70 - 99 mg/dL   BUN 27 (*) 6 - 23 mg/dL   Creatinine, Ser 1.42 (*) 0.50 - 1.10 mg/dL   Calcium 10.1  8.4 - 10.5 mg/dL   Total Protein 7.0  6.0 - 8.3 g/dL   Albumin 3.6  3.5 - 5.2 g/dL   AST 14  0 - 37 U/L   ALT 11  0 - 35 U/L   Alkaline Phosphatase 72  39 - 117 U/L   Total Bilirubin 0.2 (*) 0.3 - 1.2 mg/dL   GFR calc non Af Amer 32 (*) >90 mL/min   GFR calc Af Amer 37 (*) >90 mL/min   Comment: (NOTE)     The eGFR has been calculated using the CKD EPI equation.     This calculation has not been validated in all clinical situations.     eGFR's persistently <90 mL/min signify possible Chronic Kidney     Disease.   Anion gap 16 (*) 5 - 15  LIPASE, BLOOD     Status: None   Collection Time    07/03/14  7:16 PM      Result Value Ref Range   Lipase 48  11 - 59 U/L  CBC WITH DIFFERENTIAL     Status: Abnormal  Collection Time    07/03/14  7:16 PM      Result Value Ref Range   WBC 7.7  4.0 - 10.5 K/uL   RBC 2.96 (*) 3.87 - 5.11 MIL/uL   Hemoglobin 6.2 (*) 12.0 - 15.0 g/dL   Comment: CRITICAL RESULT CALLED TO, READ BACK BY AND VERIFIED WITH:     SPOKE WITH TEUP,M [id number] [place],N   HCT 20.0 (*) 36.0 - 46.0 %   MCV 67.6 (*) 78.0 - 100.0 fL   MCH 20.9 (*) 26.0 - 34.0 pg   MCHC 31.0  30.0 - 36.0 g/dL   RDW 16.7 (*) 11.5 - 15.5 %   Platelets 350  150 - 400 K/uL   Neutrophils Relative % 63  43 - 77 %   Lymphocytes Relative 22  12 - 46 %   Monocytes Relative 11  3 - 12 %   Eosinophils Relative 3  0 - 5 %   Basophils Relative 1  0 - 1 %   Neutro Abs 4.9  1.7 - 7.7 K/uL   Lymphs Abs 1.7  0.7 - 4.0 K/uL   Monocytes Absolute 0.8  0.1 - 1.0 K/uL   Eosinophils Absolute 0.2  0.0 - 0.7 K/uL   Basophils Absolute 0.1  0.0 - 0.1 K/uL   RBC Morphology POLYCHROMASIA PRESENT     Comment: TARGET CELLS     ELLIPTOCYTES   WBC Morphology TOXIC GRANULATION     Comment: INCREASED BANDS (>20%  BANDS)     MICROCYTES  APTT     Status: None   Collection Time    07/03/14  7:16 PM      Result Value Ref Range   aPTT 28  24 - 37 seconds  PROTIME-INR     Status: None   Collection Time    07/03/14  7:16 PM      Result Value Ref Range   Prothrombin Time 13.9  11.6 - 15.2 seconds   INR 1.06  0.00 - 1.49  TYPE AND SCREEN     Status: None   Collection Time    07/03/14  7:16 PM      Result Value Ref Range   ABO/RH(D) O POS     Antibody Screen NEG     Sample Expiration 07/06/2014     Unit Number [id number]     Blood Component Type RBC LR PHER2     Unit division 00     Status of Unit ISSUED,FINAL     Transfusion Status OK TO TRANSFUSE     Crossmatch Result Compatible     Unit Number [id number]     Blood Component Type RBC LR PHER1     Unit division 00     Status of Unit ISSUED,FINAL     Transfusion Status OK TO TRANSFUSE     Crossmatch Result Compatible    PREPARE RBC (CROSSMATCH)     Status: None   Collection Time    07/03/14  7:21 PM      Result Value Ref Range   Order Confirmation ORDER PROCESSED BY BLOOD BANK    I-STAT TROPOININ, ED     Status: None   Collection Time    07/03/14  7:29 PM      Result Value Ref Range   Troponin i, poc 0.01  0.00 - 0.08 ng/mL   Comment 3            Comment: Due to the release kinetics of cTnI,  a negative result within the first hours     of the onset of symptoms does not rule out     myocardial infarction with certainty.     If myocardial infarction is still suspected,     repeat the test at appropriate intervals.  I-STAT CHEM 8, ED     Status: Abnormal   Collection Time    07/03/14  7:30 PM      Result Value Ref Range   Sodium 131 (*) 137 - 147 mEq/L   Potassium 3.6 (*) 3.7 - 5.3 mEq/L   Chloride 94 (*) 96 - 112 mEq/L   BUN 26 (*) 6 - 23 mg/dL   Creatinine, Ser 1.60 (*) 0.50 - 1.10 mg/dL   Glucose, Bld 106 (*) 70 - 99 mg/dL   Calcium, Ion 1.28  1.13 - 1.30 mmol/L   TCO2 24  0 - 100 mmol/L   Hemoglobin 7.5 (*) 12.0 -  15.0 g/dL   HCT 22.0 (*) 36.0 - 46.0 %  ABO/RH     Status: None   Collection Time    07/03/14  9:58 PM      Result Value Ref Range   ABO/RH(D) O POS    POC OCCULT BLOOD, ED     Status: Abnormal   Collection Time    07/03/14 10:26 PM      Result Value Ref Range   Fecal Occult Bld POSITIVE (*) NEGATIVE  COMPREHENSIVE METABOLIC PANEL     Status: Abnormal   Collection Time    07/04/14  8:02 AM      Result Value Ref Range   Sodium 139  137 - 147 mEq/L   Comment: REPEATED TO VERIFY     DELTA CHECK NOTED   Potassium 3.7  3.7 - 5.3 mEq/L   Chloride 101  96 - 112 mEq/L   CO2 25  19 - 32 mEq/L   Glucose, Bld 101 (*) 70 - 99 mg/dL   BUN 20  6 - 23 mg/dL   Creatinine, Ser 0.91  0.50 - 1.10 mg/dL   Comment: REPEATED TO VERIFY     DELTA CHECK NOTED   Calcium 9.4  8.4 - 10.5 mg/dL   Total Protein 6.4  6.0 - 8.3 g/dL   Albumin 3.4 (*) 3.5 - 5.2 g/dL   AST 14  0 - 37 U/L   ALT 9  0 - 35 U/L   Alkaline Phosphatase 65  39 - 117 U/L   Total Bilirubin 0.9  0.3 - 1.2 mg/dL   GFR calc non Af Amer 55 (*) >90 mL/min   GFR calc Af Amer 63 (*) >90 mL/min   Comment: (NOTE)     The eGFR has been calculated using the CKD EPI equation.     This calculation has not been validated in all clinical situations.     eGFR's persistently <90 mL/min signify possible Chronic Kidney     Disease.   Anion gap 13  5 - 15  CBC WITH DIFFERENTIAL     Status: Abnormal   Collection Time    07/04/14  8:02 AM      Result Value Ref Range   WBC 6.0  4.0 - 10.5 K/uL   RBC 4.03  3.87 - 5.11 MIL/uL   Hemoglobin 9.5 (*) 12.0 - 15.0 g/dL   Comment: REPEATED TO VERIFY     DELTA CHECK NOTED     POST TRANSFUSION SPECIMEN   HCT 29.1 (*) 36.0 - 46.0 %  MCV 72.2 (*) 78.0 - 100.0 fL   MCH 23.6 (*) 26.0 - 34.0 pg   MCHC 32.6  30.0 - 36.0 g/dL   RDW 20.1 (*) 11.5 - 15.5 %   Platelets 318  150 - 400 K/uL   Neutrophils Relative % 69  43 - 77 %   Lymphocytes Relative 18  12 - 46 %   Monocytes Relative 9  3 - 12 %    Eosinophils Relative 3  0 - 5 %   Basophils Relative 1  0 - 1 %   Neutro Abs 4.1  1.7 - 7.7 K/uL   Lymphs Abs 1.1  0.7 - 4.0 K/uL   Monocytes Absolute 0.5  0.1 - 1.0 K/uL   Eosinophils Absolute 0.2  0.0 - 0.7 K/uL   Basophils Absolute 0.1  0.0 - 0.1 K/uL  MAGNESIUM     Status: None   Collection Time    07/04/14  8:02 AM      Result Value Ref Range   Magnesium 2.1  1.5 - 2.5 mg/dL  CBC WITH DIFFERENTIAL     Status: Abnormal   Collection Time    07/04/14  2:59 PM      Result Value Ref Range   WBC 6.5  4.0 - 10.5 K/uL   RBC 4.45  3.87 - 5.11 MIL/uL   Hemoglobin 10.6 (*) 12.0 - 15.0 g/dL   HCT 32.7 (*) 36.0 - 46.0 %   MCV 73.5 (*) 78.0 - 100.0 fL   MCH 23.8 (*) 26.0 - 34.0 pg   MCHC 32.4  30.0 - 36.0 g/dL   RDW 19.6 (*) 11.5 - 15.5 %   Platelets 331  150 - 400 K/uL   Neutrophils Relative % 63  43 - 77 %   Neutro Abs 4.2  1.7 - 7.7 K/uL   Lymphocytes Relative 21  12 - 46 %   Lymphs Abs 1.3  0.7 - 4.0 K/uL   Monocytes Relative 12  3 - 12 %   Monocytes Absolute 0.8  0.1 - 1.0 K/uL   Eosinophils Relative 3  0 - 5 %   Eosinophils Absolute 0.2  0.0 - 0.7 K/uL   Basophils Relative 1  0 - 1 %   Basophils Absolute 0.0  0.0 - 0.1 K/uL  CBC WITH DIFFERENTIAL     Status: Abnormal   Collection Time    07/04/14 11:25 PM      Result Value Ref Range   WBC 7.2  4.0 - 10.5 K/uL   RBC 4.26  3.87 - 5.11 MIL/uL   Hemoglobin 10.1 (*) 12.0 - 15.0 g/dL   HCT 31.0 (*) 36.0 - 46.0 %   MCV 72.8 (*) 78.0 - 100.0 fL   MCH 23.7 (*) 26.0 - 34.0 pg   MCHC 32.6  30.0 - 36.0 g/dL   RDW 19.8 (*) 11.5 - 15.5 %   Platelets 341  150 - 400 K/uL   Neutrophils Relative % 68  43 - 77 %   Neutro Abs 5.0  1.7 - 7.7 K/uL   Lymphocytes Relative 19  12 - 46 %   Lymphs Abs 1.3  0.7 - 4.0 K/uL   Monocytes Relative 8  3 - 12 %   Monocytes Absolute 0.6  0.1 - 1.0 K/uL   Eosinophils Relative 4  0 - 5 %   Eosinophils Absolute 0.3  0.0 - 0.7 K/uL   Basophils Relative 1  0 - 1 %   Basophils Absolute 0.0  0.0 - 0.1  K/uL  COMPREHENSIVE METABOLIC PANEL     Status: Abnormal   Collection Time    07/05/14  5:25 AM      Result Value Ref Range   Sodium 139  137 - 147 mEq/L   Potassium 4.0  3.7 - 5.3 mEq/L   Chloride 105  96 - 112 mEq/L   CO2 23  19 - 32 mEq/L   Glucose, Bld 92  70 - 99 mg/dL   BUN 18  6 - 23 mg/dL   Creatinine, Ser 0.80  0.50 - 1.10 mg/dL   Calcium 9.8  8.4 - 10.5 mg/dL   Total Protein 6.5  6.0 - 8.3 g/dL   Albumin 3.4 (*) 3.5 - 5.2 g/dL   AST 17  0 - 37 U/L   ALT 9  0 - 35 U/L   Alkaline Phosphatase 68  39 - 117 U/L   Total Bilirubin 0.5  0.3 - 1.2 mg/dL   GFR calc non Af Amer 64 (*) >90 mL/min   GFR calc Af Amer 74 (*) >90 mL/min   Comment: (NOTE)     The eGFR has been calculated using the CKD EPI equation.     This calculation has not been validated in all clinical situations.     eGFR's persistently <90 mL/min signify possible Chronic Kidney     Disease.   Anion gap 11  5 - 15  CBC WITH DIFFERENTIAL     Status: Abnormal   Collection Time    07/05/14  5:25 AM      Result Value Ref Range   WBC 7.0  4.0 - 10.5 K/uL   RBC 4.33  3.87 - 5.11 MIL/uL   Hemoglobin 10.2 (*) 12.0 - 15.0 g/dL   HCT 31.9 (*) 36.0 - 46.0 %   MCV 73.7 (*) 78.0 - 100.0 fL   MCH 23.6 (*) 26.0 - 34.0 pg   MCHC 32.0  30.0 - 36.0 g/dL   RDW 20.0 (*) 11.5 - 15.5 %   Platelets 333  150 - 400 K/uL   Neutrophils Relative % 60  43 - 77 %   Neutro Abs 4.3  1.7 - 7.7 K/uL   Lymphocytes Relative 20  12 - 46 %   Lymphs Abs 1.4  0.7 - 4.0 K/uL   Monocytes Relative 13 (*) 3 - 12 %   Monocytes Absolute 0.9  0.1 - 1.0 K/uL   Eosinophils Relative 6 (*) 0 - 5 %   Eosinophils Absolute 0.4  0.0 - 0.7 K/uL   Basophils Relative 1  0 - 1 %   Basophils Absolute 0.1  0.0 - 0.1 K/uL     RADIOLOGY RESULTS: Dg Chest Port 1 View  07/03/2014   CLINICAL DATA:  Anemia, colon cancer, scheduled for colostomy next week. Possible renal cancer.  EXAM: PORTABLE CHEST - 1 VIEW  COMPARISON:  CT chest dated 06/22/2014.  FINDINGS:  Chronic interstitial markings. Mild bibasilar atelectasis. No focal consolidation. No pleural effusion or pneumothorax.  The heart is top-normal in size.  Moderate hiatal hernia.  IMPRESSION: No evidence of acute cardiopulmonary disease.  Moderate hiatal hernia.   Electronically Signed   By: Julian Hy M.D.   On: 07/03/2014 19:18    Problem List: Patient Active Problem List   Diagnosis Date Noted  . Malnutrition of moderate degree 07/05/2014  . Renal mass 07/04/2014  . Anemia 07/03/2014  . GIB (gastrointestinal bleeding) 07/03/2014  . 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      Matt B. Hassell Done, MD, [facility] [phone] beeper [phone]  07/05/2014 11:41 AM

## 2014-07-06 NOTE — Interval H&P Note (Signed)
History and Physical Interval Note:  07/06/2014 9:58 AM  Kirsten Golden  has presented today for surgery, with the diagnosis of COLON CANCER  The various methods of treatment have been discussed with the patient and family. After consideration of risks, benefits and other options for treatment, the patient has consented to  Procedure(s): LAPAROSCOPIC ASSISTED TRANSVERSE COLECTOMY (N/A) as a surgical intervention .  The patient's history has been reviewed, patient examined, no change in status, stable for surgery.  I have reviewed the patient's chart and labs.  Questions were answered to the patient's satisfaction.     [name]

## 2014-07-06 NOTE — Anesthesia Preprocedure Evaluation (Addendum)
Anesthesia Evaluation  Patient identified by MRN, date of birth, ID band Patient awake    Reviewed: Allergy & Precautions, H&P , NPO status , Patient's Chart, lab work & pertinent test results  Airway Mallampati: II TM Distance: >3 FB Neck ROM: Full    Dental no notable dental hx.    Pulmonary neg pulmonary ROS,  breath sounds clear to auscultation  Pulmonary exam normal       Cardiovascular negative cardio ROS  Rhythm:Regular Rate:Normal     Neuro/Psych negative neurological ROS  negative psych ROS   GI/Hepatic negative GI ROS, Neg liver ROS,   Endo/Other  negative endocrine ROS  Renal/GU negative Renal ROS  negative genitourinary   Musculoskeletal negative musculoskeletal ROS (+)   Abdominal   Peds negative pediatric ROS (+)  Hematology  (+) anemia ,   Anesthesia Other Findings   Reproductive/Obstetrics negative OB ROS                           Anesthesia Physical Anesthesia Plan  ASA: III  Anesthesia Plan: General   Post-op Pain Management:    Induction: Intravenous  Airway Management Planned: Oral ETT  Additional Equipment:   Intra-op Plan:   Post-operative Plan: Extubation in OR  Informed Consent: I have reviewed the patients History and Physical, chart, labs and discussed the procedure including the risks, benefits and alternatives for the proposed anesthesia with the patient or authorized representative who has indicated his/her understanding and acceptance.   Dental advisory given  Plan Discussed with: CRNA  Anesthesia Plan Comments:        Anesthesia Quick Evaluation

## 2014-07-06 NOTE — Care Management Note (Signed)
CARE MANAGEMENT NOTE 07/06/2014  Patient:  Kirsten Golden,Kirsten Golden   Account Number:  [id number]  Date Initiated:  07/06/2014  Documentation initiated by:  Marney Doctor  Subjective/Objective Assessment:   80 yo admitted with anemia.  Hx of colon ca     Action/Plan:   From home with children   Anticipated DC Date:  07/09/2014   Anticipated DC Plan:  [place]  CM consult      Choice offered to / List presented to:             Status of service:  In process, will continue to follow Medicare Important Message given?   (If response is "NO", the following Medicare IM given date fields will be blank) Date Medicare IM given:   Medicare IM given by:   Date Additional Medicare IM given:   Additional Medicare IM given by:    Discharge Disposition:    Per UR Regulation:  Reviewed for med. necessity/level of care/duration of stay  If discussed at [place] of Stay Meetings, dates discussed:    Comments:  07/06/14 Marney Doctor RN,BSN,NCM Chart being reviewed for CM needs.  Pt for colectomy today. CM will continue to follow for DC needs.  Could Potentially need PT eval for home recommendations.

## 2014-07-06 NOTE — Transfer of Care (Signed)
Immediate Anesthesia Transfer of Care Note  Patient: Kirsten Golden  Procedure(s) Performed: Procedure(s): LAPAROSCOPIC ASSISTED TRANSVERSE COLECTOMY (N/A)  Patient Location: PACU  Anesthesia Type:General  Level of Consciousness: awake, oriented and patient cooperative  Airway & Oxygen Therapy: Patient Spontanous Breathing and Patient connected to face mask oxygen  Post-op Assessment: Report given to PACU RN and Post -op Vital signs reviewed and stable  Post vital signs: Reviewed and stable  Complications: No apparent anesthesia complications

## 2014-07-06 NOTE — Progress Notes (Addendum)
Pt taken to OR for further evaluation and recommendations by General surgeon.  I was not able to evaluate but vitals and chart reviewed. Will reassess next am.   [name]

## 2014-07-07 LAB — CBC
HEMATOCRIT: 30.7 % — AB (ref 36.0–46.0)
Hemoglobin: 9.9 g/dL — ABNORMAL LOW (ref 12.0–15.0)
MCH: 23.7 pg — AB (ref 26.0–34.0)
MCHC: 32.2 g/dL (ref 30.0–36.0)
MCV: 73.6 fL — ABNORMAL LOW (ref 78.0–100.0)
Platelets: 315 10*3/uL (ref 150–400)
RBC: 4.17 MIL/uL (ref 3.87–5.11)
RDW: 21.5 % — AB (ref 11.5–15.5)
WBC: 15.7 10*3/uL — ABNORMAL HIGH (ref 4.0–10.5)

## 2014-07-07 LAB — BASIC METABOLIC PANEL
Anion gap: 14 (ref 5–15)
BUN: 20 mg/dL (ref 6–23)
CALCIUM: 9.3 mg/dL (ref 8.4–10.5)
CO2: 21 mEq/L (ref 19–32)
CREATININE: 0.97 mg/dL (ref 0.50–1.10)
Chloride: 103 mEq/L (ref 96–112)
GFR, EST AFRICAN AMERICAN: 59 mL/min — AB (ref >90)
GFR, EST NON AFRICAN AMERICAN: 51 mL/min — AB (ref >90)
Glucose, Bld: 147 mg/dL — ABNORMAL HIGH (ref 70–99)
POTASSIUM: 4.5 meq/L (ref 3.7–5.3)
Sodium: 138 mEq/L (ref 137–147)

## 2014-07-07 NOTE — Progress Notes (Signed)
Subjective: The patient reports : post op lap assisted hemi-colectomy . POD #1 ( Dr. Hassell Done). Pt also has Left upper pole real cell carcinoma. She has hx of abnormal cxr also ( nodules), from unknown reason.   Objective: Vital signs in last 24 hours: Temp:  [97 F (36.1 C)-97.8 F (36.6 C)] 97.8 F (36.6 C) (10/17 0531) Pulse Rate:  [81-98] 93 (10/17 0531) Resp:  [12-18] 16 (10/17 0531) BP: (139-179)/(70-96) 139/71 mmHg (10/17 0531) SpO2:  [98 %-100 %] 98 % (10/17 0531)A  Intake/Output from previous day: 10/16 0701 - 10/17 0700 In: 2961.3 [I.V.:2961.3] Out: 1110 [Urine:1090; Blood:20] Intake/Output this shift:    Past Medical History  Diagnosis Date  . Colon cancer     Physical Exam:  Lungs - Normal respiratory effort, chest expands symmetrically.  Abdomen - Soft, non-tender & non-distended.  Lab Results:  Recent Labs  07/06/14 0429 07/06/14 1520 07/07/14 0525  WBC 9.6 12.1* 15.7*  HGB 11.6* 11.3* 9.9*  HCT 36.6 35.5* 30.7*   BMET  Recent Labs  07/06/14 0429 07/06/14 1520 07/07/14 0525  NA 138  --  138  K 3.9  --  4.5  CL 101  --  103  CO2 22  --  21  GLUCOSE 127*  --  147*  BUN 14  --  20  CREATININE 0.76 0.79 0.97  CALCIUM 10.3  --  9.3   No results found for this basename: LABURIN,  in the last 72 hours Results for orders placed during the hospital encounter of 07/03/14  SURGICAL PCR SCREEN     Status: None   Collection Time    07/06/14  9:00 AM      Result Value Ref Range Status   MRSA, PCR NEGATIVE  NEGATIVE Final   Staphylococcus aureus NEGATIVE  NEGATIVE Final   Comment:            The Xpert SA Assay (FDA     approved for NASAL specimens     in patients over 7 years of age),     is one component of     a comprehensive surveillance     program.  Test performance has     been validated by [employer] for patients greater     than or equal to 81 year old.     It is not intended     to diagnose infection nor to     guide or  monitor treatment.    Studies/Results: EXAM:  CT CHEST, ABDOMEN, AND PELVIS WITH CONTRAST  TECHNIQUE:  Multidetector CT imaging of the chest, abdomen and pelvis was  performed following the standard protocol during bolus  administration of intravenous contrast.  CONTRAST: 134mL OMNIPAQUE IOHEXOL 300 MG/ML SOLN  BUN and creatinine were obtained on site at [facility] at  [address]  Results: BUN 21 mg/dL, Creatinine 1.0 mg/dL.  COMPARISON: No similar prior exam is available at this institution  for comparison or on [facility] PACS. Chest/abdomen radiographs  06/12/2014 are reviewed.  FINDINGS:  CT CHEST FINDINGS  Thyroid is inhomogeneous. Ascending aortic ectasia is identified  measuring 3.9 x 3.6 cm at the level of the main pulmonary artery  bifurcation image 22. Descending thoracic aorta is tortuous and  ectatic. Large hiatal hernia containing stomach is noted. Severe  atheromatous aortic calcification and coronary arterial  calcification noted. Heart size is moderately enlarged. No  lymphadenopathy. Fluid in the superior pericardial recess is noted.  Mild biapical  pleural thickening is noted. 5 mm left upper lobe  pulmonary parenchymal nodule image 15. 1.6 cm right lower lobe  pulmonary nodule identified image 36. No pleural effusion.  CT ABDOMEN AND PELVIS FINDINGS  Lower chest: See above dedicated report  Hepatobiliary: Sub cm hypodense probable hepatic cysts or biliary  hamartomas noted. 1.5 cm dominant caudate cyst incidentally noted.  Gallbladder is normal. Mild intrahepatic ductal dilatation and mild  fusiform prominence of the common duct with tapering to the ampulla  noted.  Pancreas: Normal  Spleen: Normal  Adrenals/Urinary Tract: There is an enhancing inhomogeneous 4.5 x  4.2 cm left upper renal pole cortical mass image 50. A fat plane is  present between this mass and adjacent psoas muscle and perinephric  fat. Bilateral multiple too small to  characterize renal cortical  hypodense lesions are identified. Some hypodense renal cortical  masses cannot be confidently characterized as cysts, including  dominant 6 cm right lower renal pole cyst and 2.0 cm left mid renal  cortical cyst image 52. No hydroureteronephrosis. No renal vein  filling defects to suggest tumor thrombus.  Stomach/Bowel: Fecal impaction is present within the rectum. No  bowel wall thickening or focal segmental dilatation. Normal  appendix. Moderate volume of stool elsewhere throughout the colon.  There is short segmental descending colonic wall thickening with  mild surrounding stranding image 59, without surrounding fluid  collection identified.  Vascular/Lymphatic: No lymphadenopathy. Severe atheromatous aortic  calcification without aneurysm.  Reproductive: Uterus and ovaries are unremarkable.  Bladder is normal in appearance.  Other: Presacral soft tissue stranding is identified which may  indicate cellulitis or decubitus ulcer, correlate clinically.  Musculoskeletal: Subjectively osteopenic, with right lower thoracic  curvature centered at T7. Evidence of L2 posterior decompression.  Sclerotic lesion within the right lamina of T9 image 31. Possible  healing fracture right lateral fifth rib image 32.  IMPRESSION:  Enhancing 4.5 cm left upper renal pole cortical mass, highly  suspicious for renal cell carcinoma. The presence of bilateral  pulmonary nodules as described above raises the question of  metastatic disease. Consider PET-CT for further evaluation.  Short segmental descending colonic wall thickening for which primary  differential considerations include adenocarcinoma or focal colitis  or diverticulitis without complicating feature. Consider colonoscopy  for further evaluation.  Indeterminate sclerotic lesion involving the right lamina of T9.  Metastatic disease or bone island could appear similar and could be  further evaluated at PET-CT.   Healing right lateral fifth rib fracture.  These results will be called to the ordering clinician or  representative by the Radiologist Assistant, and communication  documented in the PACS or zVision Dashboard.  Electronically Signed  By: Conchita Paris M.D.  On: 06/22/2014 12:29        Assessment: Pt succesfully treated with colon decompression surgery. I have had family discussion this AM with husband ( speaks [place] only), other daughter, and son-in-law ( both speak [place]). We have not discussed finding of Left upper pole renal cancer with the patient, and the family still doesn't want to discuss it with her, but will consider it, depending on how she does with her bowel surgery. They also understand that she has pulmonary nodules for ?  Reason, possibly metastatic colon cancer, possibly metastatic renal cancer, or possibly unrelated and not cancer at all. She would be a candidate for PET scan, or MRI  Plan: Follow.   Hareem Surowiec I 07/07/2014, 9:07 AM

## 2014-07-07 NOTE — Progress Notes (Signed)
1 Day Post-Op  Subjective: Incisional soreness.  No nausea.  Daughters in room translating.  Objective: Vital signs in last 24 hours: Temp:  [97 F (36.1 C)-97.8 F (36.6 C)] 97.8 F (36.6 C) (10/17 0531) Pulse Rate:  [81-98] 93 (10/17 0531) Resp:  [12-18] 16 (10/17 0531) BP: (139-179)/(70-96) 139/71 mmHg (10/17 0531) SpO2:  [98 %-100 %] 98 % (10/17 0531) Last BM Date: 07/06/14  Intake/Output from previous day: 10/16 0701 - 10/17 0700 In: 2961.3 [I.V.:2961.3] Out: 1110 [Urine:1090; Blood:20] Intake/Output this shift: Total I/O In: -  Out: 450 [Urine:450]  PE: General- In NAD Abdomen-soft, few bowel sounds, wounds clean  Lab Results:   Recent Labs  07/06/14 1520 07/07/14 0525  WBC 12.1* 15.7*  HGB 11.3* 9.9*  HCT 35.5* 30.7*  PLT 365 315   BMET  Recent Labs  07/06/14 0429 07/06/14 1520 07/07/14 0525  NA 138  --  138  K 3.9  --  4.5  CL 101  --  103  CO2 22  --  21  GLUCOSE 127*  --  147*  BUN 14  --  20  CREATININE 0.76 0.79 0.97  CALCIUM 10.3  --  9.3   PT/INR No results found for this basename: LABPROT, INR,  in the last 72 hours Comprehensive Metabolic Panel:    Component Value Date/Time   NA 138 07/07/2014 0525   NA 138 07/06/2014 0429   K 4.5 07/07/2014 0525   K 3.9 07/06/2014 0429   CL 103 07/07/2014 0525   CL 101 07/06/2014 0429   CO2 21 07/07/2014 0525   CO2 22 07/06/2014 0429   BUN 20 07/07/2014 0525   BUN 14 07/06/2014 0429   CREATININE 0.97 07/07/2014 0525   CREATININE 0.79 07/06/2014 1520   GLUCOSE 147* 07/07/2014 0525   GLUCOSE 127* 07/06/2014 0429   CALCIUM 9.3 07/07/2014 0525   CALCIUM 10.3 07/06/2014 0429   AST 18 07/06/2014 0429   AST 17 07/05/2014 0525   ALT 12 07/06/2014 0429   ALT 9 07/05/2014 0525   ALKPHOS 79 07/06/2014 0429   ALKPHOS 68 07/05/2014 0525   BILITOT 0.8 07/06/2014 0429   BILITOT 0.5 07/05/2014 0525   PROT 7.5 07/06/2014 0429   PROT 6.5 07/05/2014 0525   ALBUMIN 3.9 07/06/2014 0429   ALBUMIN 3.4*  07/05/2014 0525     Studies/Results: No results found.  Anti-infectives: Anti-infectives   Start     Dose/Rate Route Frequency Ordered Stop   07/06/14 0600  cefOXitin (MEFOXIN) 2 g in dextrose 5 % 50 mL IVPB     2 g 100 mL/hr over 30 Minutes Intravenous On call to O.R. 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   LOS: 4 days   Plan: OOB.  Clear liquids.   Kirsten Golden J 07/07/2014

## 2014-07-07 NOTE — Progress Notes (Signed)
[facility] PROGRESS NOTE  Kirsten Golden ZOX:[id number] DOB: 20-May-1924 DOA: 07/03/2014 PCP: No primary provider on file.  Assessment/Plan: Active Problems:   Anemia - pt is s/p transfusion - improved hgb of 9.9 - 2ary to GIB from suspect colon/rectal cancer. [facility] has obtained biopsy which primary care physician shared with biopsy results with family today. - Will continue to monitor hemoglobin levels, no active bleeding reported    GIB (gastrointestinal bleeding) - Pt is pod # 1 s/p lap left colectomy 07/06/14  Renal mass - Discussed with Urology. Awaiting further work up before deciding which course of action to take.  - General surgery and urology to discuss case - Patient to obtain PET scan to help determine whether pulmonary nodules are metastatic or benign  Pulmonary nodules - Could represent metastatic carcinoma. Etiology uncertain at this juncture. - Agree with PET scan after discharge for further evaluation.  Malnutrition of moderate degree - Since patient is going to have procedure most likely next a.m. we'll place on clear liquid diet - Agree with nutritional supplementation as recommended by registered dietitian   Pt meets criteria for mild/moderate MALNUTRITION in the context of chronic illness as evidenced by >5% weight loss in the past month, <75% intake for > 1 month, and decreased muscle mass.     Code Status: Full Family Communication: Daughter and husband Disposition Plan: Pending recommendations from specialist involved.    Consultants:  Urology  CCS  Procedures:  None  Antibiotics:  None  HPI/Subjective: Discussed update with family  Objective: Filed Vitals:   07/07/14 1359  BP: 150/64  Pulse: 100  Temp: 98.6 F (37 C)  Resp: 16    Intake/Output Summary (Last 24 hours) at 07/07/14 1623 Last data filed at 07/07/14 1454  Gross per 24 hour  Intake 1678.75 ml  Output   1050 ml  Net 628.75 ml   Filed Weights   07/04/14 0145  Weight: 47.5 kg (104 lb 11.5 oz)    Exam:   General:  Pt in nad, alert and awake  Cardiovascular: rrr, no mrg  Respiratory: cta bl, no wheezes  Abdomen: soft, no rebound tenderness, NT, + bowel sounds  Musculoskeletal: no cyanosis or clubbing   Data Reviewed: Basic Metabolic Panel:  Recent Labs Lab 07/03/14 1916 07/03/14 1930 07/04/14 0802 07/05/14 0525 07/06/14 0429 07/06/14 1520 07/07/14 0525  NA 135* 131* 139 139 138  --  138  K 4.0 3.6* 3.7 4.0 3.9  --  4.5  CL 94* 94* 101 105 101  --  103  CO2 25  --  25 23 22   --  21  GLUCOSE 103* 106* 101* 92 127*  --  147*  BUN 27* 26* 20 18 14   --  20  CREATININE 1.42* 1.60* 0.91 0.80 0.76 0.79 0.97  CALCIUM 10.1  --  9.4 9.8 10.3  --  9.3  MG  --   --  2.1  --   --   --   --    Liver Function Tests:  Recent Labs Lab 07/03/14 1916 07/04/14 0802 07/05/14 0525 07/06/14 0429  AST 14 14 17 18   ALT 11 9 9 12   ALKPHOS 72 65 68 79  BILITOT 0.2* 0.9 0.5 0.8  PROT 7.0 6.4 6.5 7.5  ALBUMIN 3.6 3.4* 3.4* 3.9    Recent Labs Lab 07/03/14 1916  LIPASE 48   No results found for this basename: AMMONIA,  in the last 168 hours CBC:  Recent Labs Lab 07/04/14 2325  07/05/14 0525 07/05/14 1436 07/05/14 2311 07/06/14 0429 07/06/14 1520 07/07/14 0525  WBC 7.2 7.0 6.9 8.8 9.6 12.1* 15.7*  NEUTROABS 5.0 4.3 4.8 6.5 6.6  --   --   HGB 10.1* 10.2* 10.0* 10.3* 11.6* 11.3* 9.9*  HCT 31.0* 31.9* 31.1* 31.6* 36.6 35.5* 30.7*  MCV 72.8* 73.7* 73.3* 73.0* 74.5* 74.1* 73.6*  PLT 341 333 357 345 379 365 315   Cardiac Enzymes: No results found for this basename: CKTOTAL, CKMB, CKMBINDEX, TROPONINI,  in the last 168 hours BNP (last 3 results) No results found for this basename: PROBNP,  in the last 8760 hours CBG: No results found for this basename: GLUCAP,  in the last 168 hours  Recent Results (from the past 240 hour(s))  SURGICAL PCR SCREEN     Status: None   Collection Time    07/06/14  9:00 AM       Result Value Ref Range Status   MRSA, PCR NEGATIVE  NEGATIVE Final   Staphylococcus aureus NEGATIVE  NEGATIVE Final   Comment:            The Xpert SA Assay (FDA     approved for NASAL specimens     in patients over 13 years of age),     is one component of     a comprehensive surveillance     program.  Test performance has     been validated by [employer] for patients greater     than or equal to 85 year old.     It is not intended     to diagnose infection nor to     guide or monitor treatment.     Studies: No results found.  Scheduled Meds: . antiseptic oral rinse  7 mL Mouth Rinse BID  . heparin  5,000 Units Subcutaneous 3 times per day   Continuous Infusions: . dextrose 5 % and 0.45 % NaCl with KCl 20 mEq/L 75 mL/hr at 07/07/14 0513    Time spent: > 35 minutes    [name]  [facility] Pager [phone] If 7PM-7AM, please contact night-coverage at [email or web address], password [facility] 07/07/2014, 4:23 PM  LOS: 4 days

## 2014-07-08 LAB — BASIC METABOLIC PANEL
ANION GAP: 13 (ref 5–15)
BUN: 14 mg/dL (ref 6–23)
CALCIUM: 9.6 mg/dL (ref 8.4–10.5)
CO2: 21 mEq/L (ref 19–32)
CREATININE: 0.75 mg/dL (ref 0.50–1.10)
Chloride: 98 mEq/L (ref 96–112)
GFR calc Af Amer: 85 mL/min — ABNORMAL LOW (ref >90)
GFR, EST NON AFRICAN AMERICAN: 73 mL/min — AB (ref >90)
Glucose, Bld: 153 mg/dL — ABNORMAL HIGH (ref 70–99)
Potassium: 4 mEq/L (ref 3.7–5.3)
Sodium: 132 mEq/L — ABNORMAL LOW (ref 137–147)

## 2014-07-08 LAB — CBC
HCT: 29.3 % — ABNORMAL LOW (ref 36.0–46.0)
Hemoglobin: 9.5 g/dL — ABNORMAL LOW (ref 12.0–15.0)
MCH: 23.8 pg — AB (ref 26.0–34.0)
MCHC: 32.4 g/dL (ref 30.0–36.0)
MCV: 73.3 fL — ABNORMAL LOW (ref 78.0–100.0)
PLATELETS: 278 10*3/uL (ref 150–400)
RBC: 4 MIL/uL (ref 3.87–5.11)
RDW: 21.9 % — ABNORMAL HIGH (ref 11.5–15.5)
WBC: 14.9 10*3/uL — ABNORMAL HIGH (ref 4.0–10.5)

## 2014-07-08 MED ORDER — SODIUM CHLORIDE 0.9 % IV SOLN
INTRAVENOUS | Status: DC
  Administered 2014-07-09 – 2014-07-13 (×7): via INTRAVENOUS

## 2014-07-08 MED ORDER — PANTOPRAZOLE SODIUM 40 MG IV SOLR
40.0000 mg | Freq: Every day | INTRAVENOUS | Status: DC
  Administered 2014-07-08 – 2014-07-14 (×7): 40 mg via INTRAVENOUS
  Filled 2014-07-08 (×7): qty 40

## 2014-07-08 MED ORDER — SODIUM CHLORIDE 0.9 % IV SOLN
INTRAVENOUS | Status: AC
  Administered 2014-07-08: 17:00:00 via INTRAVENOUS

## 2014-07-08 NOTE — Progress Notes (Signed)
Grandson present in room at time of round and translating. Patient reports no urine voided since foley catheter was taken out this am despite multiple attempts.  Bladder scan performed with 0cc of urine noted in bladder.  Grandson uncertain if report of not voiding from patient is accurate.  RN to verify information with patients daughter when she arrives as she is primarily assisting the patient.

## 2014-07-08 NOTE — Progress Notes (Signed)
Urology Progress Note  2 Days Post-Op partial colectomjy for colon cancer  Subjective: Left upper pole renal mass: probable RCC                         2 lung nodules ? Mets from colon or kidney primaries vs insignificant    No acute urologic events overnight. Ambulation:   positive Flatus:    negative Bowel movement  negative  Pain: some relief  Objective:  Blood pressure 135/66, pulse 79, temperature 98 F (36.7 C), temperature source Oral, resp. rate 14, height 4\' 5"  (1.346 m), weight 47.5 kg (104 lb 11.5 oz), SpO2 97.00%.  Physical Exam:  General:  No acute distress, awake Extremities: Homans sign is negative, no sign of DVT  Foley: out   I/O last 3 completed shifts: In: 1803.8 [P.O.:125; I.V.:1678.8] Out: 3500 [Urine:3500]  Recent Labs     07/07/14  0525  07/08/14  0527  HGB  9.9*  9.5*  WBC  15.7*  14.9*  PLT  315  278    Recent Labs     07/07/14  0525  07/08/14  0527  NA  138  132*  K  4.5  4.0  CL  103  98  CO2  21  21  BUN  20  14  CREATININE  0.97  0.75  CALCIUM  9.3  9.6  GFRNONAA  51*  73*  GFRAA  59*  85*       Assessment/Plan:  Long discussion with family this AM regarding the kidney mass and the lung nodules. Note the following: 1. Pt and husband speak only [place]. Daughters translate. Family well known to me.  2. Daughter volunteers for Hospice 3. Granddaughter is GYN Resident at [employer] to me.  4. Multiple family members in medicine ( neurology, etc.), but not primarily involved. 5. Discussed with family that mother is used to decisions in [place] being made by doctor, but here we believe decisions should be patient driven with input and direction from family.  6. Discussed that  Pt may want to go ahead and see Medical Oncologist while in  House to begin to gather necessary data to make decisions after she is released from [place].  7. Grandaughter may e-mail me for discussion if she wants 8. ? PET scan ( IF it is to be done, it may need tio  be done as outpatient).

## 2014-07-08 NOTE — Progress Notes (Signed)
Daughter present at bedside and confirms that patient has had no urine output since foley removal.  MD notified and order obtained to increase fluid rate.  Will continue to monitor.

## 2014-07-08 NOTE — Progress Notes (Signed)
In and out cath performed, 400 cc of clear yellow urine removed from bladder.  Patient tolerated procedure well. Will continue to monitor.

## 2014-07-08 NOTE — Progress Notes (Signed)
No urine output despite multiple attempts to void. Bladder scan performed and found to have 258cc of urine in bladder.  MD notified and order for in and out cath x1 obtained.

## 2014-07-08 NOTE — Progress Notes (Signed)
[facility] PROGRESS NOTE  Kirsten Golden FFM:[id number] DOB: 1924-07-07 DOA: 07/03/2014 PCP: No primary provider on file.  Assessment/Plan: Active Problems:   Anemia - pt is s/p transfusion - improved hgb of 9.9 - 2ary to GIB from suspect colon/rectal cancer. [facility] has obtained biopsy which primary care physician shared with biopsy results with family today. - Will continue to monitor hemoglobin levels, no active bleeding reported    GIB (gastrointestinal bleeding) - Pt is pod # 2 s/p lap left colectomy 07/06/14 - No active bleeding - Patient had Foley removed currently with no urine output within the last several hours. Bladder scan negative. We'll increase IV fluid rate and reassess  Renal mass - Discussed with Urology. Awaiting further work up before deciding which course of action to take.  - General surgery and urology on board - Patient to obtain PET scan to help determine whether pulmonary nodules are metastatic or benign - Will contact oncologist next am to help coordinate care  Pulmonary nodules - Could represent metastatic carcinoma. Etiology uncertain at this juncture.  Malnutrition of moderate degree - Since patient is going to have procedure most likely next a.m. we'll place on clear liquid diet - Agree with nutritional supplementation as recommended by registered dietitian   Pt meets criteria for mild/moderate MALNUTRITION in the context of chronic illness as evidenced by >5% weight loss in the past month, <75% intake for > 1 month, and decreased muscle mass.     Code Status: Full Family Communication: Daughter and husband Disposition Plan: Pending recommendations from specialist involved.    Consultants:  Urology  CCS  Procedures:  None  Antibiotics:  None  HPI/Subjective: Pt has no new complaints. No acute issues overnight.  Objective: Filed Vitals:   07/08/14 1506  BP: 115/77  Pulse: 99  Temp: 97.9 F (36.6 C)  Resp:  16    Intake/Output Summary (Last 24 hours) at 07/08/14 1549 Last data filed at 07/08/14 1500  Gross per 24 hour  Intake    465 ml  Output   2450 ml  Net  -1985 ml   Filed Weights   07/04/14 0145  Weight: 47.5 kg (104 lb 11.5 oz)    Exam:   General:  Pt in nad, alert and awake  Cardiovascular: rrr, no mrg  Respiratory: cta bl, no wheezes  Abdomen: soft, no rebound tenderness, NT, + bowel sounds  Musculoskeletal: no cyanosis or clubbing   Data Reviewed: Basic Metabolic Panel:  Recent Labs Lab 07/03/14 1930 07/04/14 0802 07/05/14 0525 07/06/14 0429 07/06/14 1520 07/07/14 0525 07/08/14 0527  NA 131* 139 139 138  --  138 132*  K 3.6* 3.7 4.0 3.9  --  4.5 4.0  CL 94* 101 105 101  --  103 98  CO2  --  25 23 22   --  21 21  GLUCOSE 106* 101* 92 127*  --  147* 153*  BUN 26* 20 18 14   --  20 14  CREATININE 1.60* 0.91 0.80 0.76 0.79 0.97 0.75  CALCIUM  --  9.4 9.8 10.3  --  9.3 9.6  MG  --  2.1  --   --   --   --   --    Liver Function Tests:  Recent Labs Lab 07/03/14 1916 07/04/14 0802 07/05/14 0525 07/06/14 0429  AST 14 14 17 18   ALT 11 9 9 12   ALKPHOS 72 65 68 79  BILITOT 0.2* 0.9 0.5 0.8  PROT 7.0 6.4 6.5 7.5  ALBUMIN 3.6 3.4* 3.4* 3.9    Recent Labs Lab 07/03/14 1916  LIPASE 48   No results found for this basename: AMMONIA,  in the last 168 hours CBC:  Recent Labs Lab 07/04/14 2325 07/05/14 0525 07/05/14 1436 07/05/14 2311 07/06/14 0429 07/06/14 1520 07/07/14 0525 07/08/14 0527  WBC 7.2 7.0 6.9 8.8 9.6 12.1* 15.7* 14.9*  NEUTROABS 5.0 4.3 4.8 6.5 6.6  --   --   --   HGB 10.1* 10.2* 10.0* 10.3* 11.6* 11.3* 9.9* 9.5*  HCT 31.0* 31.9* 31.1* 31.6* 36.6 35.5* 30.7* 29.3*  MCV 72.8* 73.7* 73.3* 73.0* 74.5* 74.1* 73.6* 73.3*  PLT 341 333 357 345 379 365 315 278   Cardiac Enzymes: No results found for this basename: CKTOTAL, CKMB, CKMBINDEX, TROPONINI,  in the last 168 hours BNP (last 3 results) No results found for this basename:  PROBNP,  in the last 8760 hours CBG: No results found for this basename: GLUCAP,  in the last 168 hours  Recent Results (from the past 240 hour(s))  SURGICAL PCR SCREEN     Status: None   Collection Time    07/06/14  9:00 AM      Result Value Ref Range Status   MRSA, PCR NEGATIVE  NEGATIVE Final   Staphylococcus aureus NEGATIVE  NEGATIVE Final   Comment:            The Xpert SA Assay (FDA     approved for NASAL specimens     in patients over 78 years of age),     is one component of     a comprehensive surveillance     program.  Test performance has     been validated by [employer] for patients greater     than or equal to 62 year old.     It is not intended     to diagnose infection nor to     guide or monitor treatment.     Studies: No results found.  Scheduled Meds: . antiseptic oral rinse  7 mL Mouth Rinse BID  . heparin  5,000 Units Subcutaneous 3 times per day  . pantoprazole (PROTONIX) IV  40 mg Intravenous Daily   Continuous Infusions: . sodium chloride    . sodium chloride      Time spent: > 35 minutes    [name]  [facility] Pager [phone] If 7PM-7AM, please contact night-coverage at [email or web address], password [facility] 07/08/2014, 3:49 PM  LOS: 5 days

## 2014-07-08 NOTE — Progress Notes (Signed)
2 Days Post-Op  Subjective: No flatus or BM.  Some heartburn.  Daughters in room translating.  Objective: Vital signs in last 24 hours: Temp:  [97.9 F (36.6 C)-98.6 F (37 C)] 98 F (36.7 C) (10/18 0513) Pulse Rate:  [79-100] 79 (10/18 0513) Resp:  [14-16] 14 (10/18 0513) BP: (135-152)/(64-79) 135/66 mmHg (10/18 0513) SpO2:  [97 %-100 %] 97 % (10/18 0513) Last BM Date: 07/06/14  Intake/Output from previous day: 10/17 0701 - 10/18 0700 In: 792.5 [P.O.:125; I.V.:667.5] Out: 2900 [Urine:2900] Intake/Output this shift:    PE: General- In NAD Abdomen-soft, few bowel sounds, wounds clean  Lab Results:   Recent Labs  07/07/14 0525 07/08/14 0527  WBC 15.7* 14.9*  HGB 9.9* 9.5*  HCT 30.7* 29.3*  PLT 315 278   BMET  Recent Labs  07/07/14 0525 07/08/14 0527  NA 138 132*  K 4.5 4.0  CL 103 98  CO2 21 21  GLUCOSE 147* 153*  BUN 20 14  CREATININE 0.97 0.75  CALCIUM 9.3 9.6   PT/INR No results found for this basename: LABPROT, INR,  in the last 72 hours Comprehensive Metabolic Panel:    Component Value Date/Time   NA 132* 07/08/2014 0527   NA 138 07/07/2014 0525   K 4.0 07/08/2014 0527   K 4.5 07/07/2014 0525   CL 98 07/08/2014 0527   CL 103 07/07/2014 0525   CO2 21 07/08/2014 0527   CO2 21 07/07/2014 0525   BUN 14 07/08/2014 0527   BUN 20 07/07/2014 0525   CREATININE 0.75 07/08/2014 0527   CREATININE 0.97 07/07/2014 0525   GLUCOSE 153* 07/08/2014 0527   GLUCOSE 147* 07/07/2014 0525   CALCIUM 9.6 07/08/2014 0527   CALCIUM 9.3 07/07/2014 0525   AST 18 07/06/2014 0429   AST 17 07/05/2014 0525   ALT 12 07/06/2014 0429   ALT 9 07/05/2014 0525   ALKPHOS 79 07/06/2014 0429   ALKPHOS 68 07/05/2014 0525   BILITOT 0.8 07/06/2014 0429   BILITOT 0.5 07/05/2014 0525   PROT 7.5 07/06/2014 0429   PROT 6.5 07/05/2014 0525   ALBUMIN 3.9 07/06/2014 0429   ALBUMIN 3.4* 07/05/2014 0525     Studies/Results: No results found.  Anti-infectives: Anti-infectives    Start     Dose/Rate Route Frequency Ordered Stop   07/06/14 0600  cefOXitin (MEFOXIN) 2 g in dextrose 5 % 50 mL IVPB     2 g 100 mL/hr over 30 Minutes Intravenous On call to O.R. 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  Some heartburn.  Family would like Oncology consultation while she is in hospital as it would be easier on them since they are here now with her.   LOS: 5 days   Plan: Keep on clear liquids.  Will leave Oncology consultation up to Dr. Hassell Done. Protonix for heartburn.   Kirsten Golden J 07/08/2014

## 2014-07-09 ENCOUNTER — Encounter (HOSPITAL_COMMUNITY): Payer: Self-pay | Admitting: Surgery

## 2014-07-09 DIAGNOSIS — K625 Hemorrhage of anus and rectum: Secondary | ICD-10-CM

## 2014-07-09 MED ORDER — BOOST / RESOURCE BREEZE PO LIQD: 1.0000 | Freq: Three times a day (TID) | ORAL | Status: DC | PRN

## 2014-07-09 MED ORDER — METOPROLOL TARTRATE 1 MG/ML IV SOLN
5.0000 mg | Freq: Once | INTRAVENOUS | Status: AC
  Administered 2014-07-09: 5 mg via INTRAVENOUS

## 2014-07-09 MED ORDER — DILTIAZEM HCL 25 MG/5ML IV SOLN
10.0000 mg | Freq: Once | INTRAVENOUS | Status: DC
  Filled 2014-07-09: qty 5

## 2014-07-09 MED ORDER — METOPROLOL TARTRATE 1 MG/ML IV SOLN
INTRAVENOUS | Status: AC
  Administered 2014-07-09: 5 mg via INTRAVENOUS
  Filled 2014-07-09: qty 5

## 2014-07-09 NOTE — Progress Notes (Signed)
[facility] CONSULT NOTE  Patient has no care team.  CHIEF COMPLAINTS/PURPOSE OF CONSULTATION:  Suspicious for colon cancer and renal cancer with lung nodules   HISTORY OF PRESENTING ILLNESS:  Kirsten Golden 80 y.o. female is here because abdominal pain and constipation. She also had blood in the stools. CT scan of the abdomen and pelvis done revealed a left upper pole of the kidney mass measuring 4.5 x 4.2 cm with multiple small renal cortical hypodense lesions some of which are cysts including a dominant right lower pole kidney cyst. There was significant constipation and fecal impaction. Patient underwent a colonoscopy and a biopsy and was found to have a near obstructing mass in the transverse colon. Dr. Charlyn Minerva performed a laparoscopic hemicolectomy on 07/05/2014. I do not have the final pathology report she was able to free anastomosis of her bowels. We are consulted to assist with management ofand colon as well as kidney issues. Patient is running consistently and she wishes to communicate with her daughters. Her husband also does not speak [place].  Family give strict instructions to not inform the patient about any news regarding cancers poor prognosis. I discussed with them that the patient deserves to know and I instructed them to communicate our discussions with her. Her husband was explained the details that I discussed with the patient's family.  I reviewed her records extensively and collaborated the history with the patient.  MEDICAL HISTORY:  Past Medical History  Diagnosis Date  . Colon cancer     SURGICAL HISTORY: Past Surgical History  Procedure Laterality Date  . Laparoscopic partial colectomy N/A 07/06/2014    Procedure: LAPAROSCOPIC ASSISTED TRANSVERSE COLECTOMY;  Surgeon: Pedro Earls, MD;  Location: [facility];  Service: General;  Laterality: N/A;    SOCIAL HISTORY: History   Social History  . Marital Status: Widowed    Spouse  Name: N/A    Number of Children: N/A  . Years of Education: N/A   Occupational History  . Not on file.   Social History Main Topics  . Smoking status: Never Smoker   . Smokeless tobacco: Not on file  . Alcohol Use: No  . Drug Use: Not on file  . Sexual Activity: Not on file   Other Topics Concern  . Not on file   Social History Narrative  . No narrative on file    FAMILY HISTORY: History reviewed. No pertinent family history.  ALLERGIES:  has No Known Allergies.  MEDICATIONS:  Current Facility-Administered Medications  Medication Dose Route Frequency Provider Last Rate Last Dose  . 0.9 %  sodium chloride infusion   Intravenous Continuous Clovis Riley, RPH 75 mL/hr at 07/09/14 1696    . antiseptic oral rinse (CPC / CETYLPYRIDINIUM CHLORIDE 0.05%) solution 7 mL  7 mL Mouth Rinse BID Shanda Howells, MD   7 mL at 07/09/14 1000  . feeding supplement (RESOURCE BREEZE) (RESOURCE BREEZE) liquid 1 Container  1 Container Oral TID BM PRN Darrol Jump, RD      . heparin injection 5,000 Units  5,000 Units Subcutaneous 3 times per day Pedro Earls, MD   5,000 Units at 07/09/14 1428  . HYDROcodone-acetaminophen (NORCO/VICODIN) 5-325 MG per tablet 1-2 tablet  1-2 tablet Oral Q4H PRN Pedro Earls, MD      . morphine 2 MG/ML injection 1 mg  1 mg Intravenous Q1H PRN Pedro Earls, MD   1 mg at 07/09/14 0219  . ondansetron (ZOFRAN) tablet 4 mg  4 mg Oral Q6H PRN Pedro Earls, MD       Or  . ondansetron [facility]) injection 4 mg  4 mg Intravenous Q6H PRN Pedro Earls, MD      . pantoprazole (PROTONIX) injection 40 mg  40 mg Intravenous Daily Jackolyn Confer, MD   40 mg at 07/09/14 1154    REVIEW OF SYSTEMS:   Constitutional: Frail lady in no acute distress Eyes: Denies blurriness of vision, double vision or watery eyes Ears, nose, mouth, throat, and face: Denies mucositis or sore throat Respiratory: Denies cough, dyspnea or wheezes Cardiovascular: Denies  palpitation, chest discomfort or lower extremity swelling Gastrointestinal:   constipation Skin: Denies abnormal skin rashes Lymphatics: Denies new lymphadenopathy or easy bruising Neurological:Denies numbness, tingling or new weaknesses Behavioral/Psych: Mood is stable, no new changes  All other systems were reviewed with the patient and are negative.  PHYSICAL EXAMINATION: ECOG PERFORMANCE STATUS: 3 - Symptomatic, >50% confined to bed  Filed Vitals:   07/09/14 1300  BP: 143/85  Pulse: 93  Temp: 98 F (36.7 C)  Resp: 16   Filed Weights   07/04/14 0145  Weight: 104 lb 11.5 oz (47.5 kg)    GENERAL:alert, no distress and comfortable SKIN: skin color, texture, turgor are normal, no rashes or significant lesions EYES: normal, conjunctiva are pink and non-injected, sclera clear OROPHARYNX:no exudate, no erythema and lips, buccal mucosa, and tongue normal  NECK: supple, thyroid normal size, non-tender, without nodularity LYMPH:  no palpable lymphadenopathy in the cervical, axillary or inguinal LUNGS: clear to auscultation and percussion with normal breathing effort HEART: regular rate & rhythm and no murmurs and no lower extremity edema ABDOMEN:abdomen soft, non-tender and normal bowel sounds Musculoskeletal:no cyanosis of digits and no clubbing  PSYCH: alert & oriented x 3 with fluent speech NEURO: no focal motor/sensory deficits  LABORATORY DATA:  I have reviewed the data as listed Lab Results  Component Value Date   WBC 14.9* 07/08/2014   HGB 9.5* 07/08/2014   HCT 29.3* 07/08/2014   MCV 73.3* 07/08/2014   PLT 278 07/08/2014   Lab Results  Component Value Date   NA 132* 07/08/2014   K 4.0 07/08/2014   CL 98 07/08/2014   CO2 21 07/08/2014    RADIOGRAPHIC STUDIES: I have personally reviewed the radiological reports and agreed with the findings in the report.  ASSESSMENT AND PLAN:  1. colon resection suspicious for colon cancer. Her awaiting the final pathology  report regarding the extent of invasion as well as any lymph nodes. Irrespective of her state, there is no indication for adjuvant systemic chemotherapy since she is very frail and elderly at age 36.   2. renal mass with renal cysts in addition to lung nodules on CT of the chest. I discussed with them that the lung nodules may be benign or malignant. The renal mass is also suspicious for malignancy but we cannot be certain without a biopsy. Given her elderly age I do not recommend doing any surgeries. Family was insistent on obtaining a PET/CT scan to know whether the lung nodules are malignant. I discussed with him that renal cell cancer tends to have PET negative disease even if they were metastatic disease.  I recommended hospice care when she returns home. I think what made with hospice regarding her end-of-life care.  All questions were answered.     Rulon Eisenmenger, MD @T @ 4:40 PM

## 2014-07-09 NOTE — Progress Notes (Signed)
NUTRITION FOLLOW UP  Intervention:   - Clear Liquid diet with diet advancement per MD - Resource Breeze po tid prn, each supplement provides 250 kcal and 9 grams of protein - RD to follow  Nutrition Dx:   Inadequate oral intake related to inability to eat as evidenced by NPO; improving  Goal:   Pt to meet >/= 90% of their estimated nutrition needs; not met  Monitor:   PO intake, labs, weight trend  Assessment:   Patient admitted with anemia secondary to GI bleed. Colon cancer with cancer found at other sites   10/14:  -Per daughter in law, patient with fair intake prior to admit  -Severe constipation  -UBW >110 lbs. Recent 25 lb weight loss per chart.  -Patient is persian and speaks [place].  10/15: - Spoke with family who were concerned that pt remains NPO. Family says that she was "crying for food because she was so hungry" last night. Diet was advanced and pt was able to eat pasta last night. Pt NPO today for possible surgery. Pt has not been seen by surgery at this time.  - Spoke with RN who contacted MD. Diet advanced to Dysphagia III.   10/19: - Patient s/p left colectomy 10/16. - Tolerating Clear liquids except for vomiting once yesterday. - no flatus or BM yet   Pt meets criteria for mild/moderate MALNUTRITION in the context of chronic illness as evidenced by >5% weight loss in the past month, <75% intake for > 1 month, and decreased muscle mass.  Height: Ht Readings from Last 1 Encounters:  07/04/14 _0  (1.346 m)    Weight Status:   Wt Readings from Last 1 Encounters:  07/04/14 104 lb 11.5 oz (47.5 kg)    Re-estimated needs:  Kcal: 1400-1500 Protein: 55-65 g Fluid: 1.5 L/day  Skin: intact  Diet Order: Clear Liquid   Intake/Output Summary (Last 24 hours) at 07/09/14 1244 Last data filed at 07/09/14 0501  Gross per 24 hour  Intake    100 ml  Output    650 ml  Net   -550 ml    Last BM: prior to admission   Labs:   Recent Labs Lab  07/03/14 1930 07/04/14 0802  07/06/14 0429 07/06/14 1520 07/07/14 0525 07/08/14 0527  NA 131* 139  < > 138  --  138 132*  K 3.6* 3.7  < > 3.9  --  4.5 4.0  CL 94* 101  < > 101  --  103 98  CO2  --  25  < > 22  --  21 21  BUN 26* 20  < > 14  --  20 14  CREATININE 1.60* 0.91  < > 0.76 0.79 0.97 0.75  CALCIUM  --  9.4  < > 10.3  --  9.3 9.6  MG  --  2.1  --   --   --   --   --   GLUCOSE 106* 101*  < > 127*  --  147* 153*  < > = values in this interval not displayed.  CBG (last 3)  No results found for this basename: GLUCAP,  in the last 72 hours  Scheduled Meds: . antiseptic oral rinse  7 mL Mouth Rinse BID  . heparin  5,000 Units Subcutaneous 3 times per day  . pantoprazole (PROTONIX) IV  40 mg Intravenous Daily    Continuous Infusions: . sodium chloride 75 mL/hr at 07/09/14 [place] RD, LDN

## 2014-07-09 NOTE — Progress Notes (Signed)
NT stated that pt's HR was alternating from 80s-140s when vitals were taken. After ambulating and resting a few minutes, pt's HR and BP were rechecked at HR was alternating between 100 and 170. Pt in no sign of distress: no chest pain or shortness of breath. A stat EKG was obtained and pt was found to be in A fib with RVR. NP Chaney Malling on call notified and order received to transfer pt to telemetry.    Report given to [facility]. Pt transferred to 1406. Pt in stable condition. Noreene Larsson RN, BSN

## 2014-07-09 NOTE — Progress Notes (Signed)
Patient ID: Kirsten Golden, female   DOB: 1923/10/09, 80 y.o.   MRN: [id number] [facility] Progress Note:   3 Days Post-Op  Subjective: Mental status is clear but language barrier exists Objective: Vital signs in last 24 hours: Temp:  [97.9 F (36.6 C)-98.7 F (37.1 C)] 98 F (36.7 C) (10/19 1300) Pulse Rate:  [70-99] 93 (10/19 1300) Resp:  [16-18] 16 (10/19 1300) BP: (115-144)/(77-85) 143/85 mmHg (10/19 1300) SpO2:  [97 %-100 %] 100 % (10/19 1300)  Intake/Output from previous day: 10/18 0701 - 10/19 0700 In: 340 [P.O.:340] Out: 650 [Urine:650] Intake/Output this shift:    Physical Exam: Work of breathing is normal.  Honeycomb dressing on incision  Lab Results:  Results for orders placed during the hospital encounter of 07/03/14 (from the past 48 hour(s))  CBC     Status: Abnormal   Collection Time    07/08/14  5:27 AM      Result Value Ref Range   WBC 14.9 (*) 4.0 - 10.5 K/uL   RBC 4.00  3.87 - 5.11 MIL/uL   Hemoglobin 9.5 (*) 12.0 - 15.0 g/dL   HCT 29.3 (*) 36.0 - 46.0 %   MCV 73.3 (*) 78.0 - 100.0 fL   MCH 23.8 (*) 26.0 - 34.0 pg   MCHC 32.4  30.0 - 36.0 g/dL   RDW 21.9 (*) 11.5 - 15.5 %   Platelets 278  150 - 400 K/uL  BASIC METABOLIC PANEL     Status: Abnormal   Collection Time    07/08/14  5:27 AM      Result Value Ref Range   Sodium 132 (*) 137 - 147 mEq/L   Potassium 4.0  3.7 - 5.3 mEq/L   Chloride 98  96 - 112 mEq/L   CO2 21  19 - 32 mEq/L   Glucose, Bld 153 (*) 70 - 99 mg/dL   BUN 14  6 - 23 mg/dL   Creatinine, Ser 0.75  0.50 - 1.10 mg/dL   Calcium 9.6  8.4 - 10.5 mg/dL   GFR calc non Af Amer 73 (*) >90 mL/min   GFR calc Af Amer 85 (*) >90 mL/min   Comment: (NOTE)     The eGFR has been calculated using the CKD EPI equation.     This calculation has not been validated in all clinical situations.     eGFR's persistently <90 mL/min signify possible Chronic Kidney     Disease.   Anion gap 13  5 - 15    Radiology/Results: No  results found.  Anti-infectives: Anti-infectives   Start     Dose/Rate Route Frequency Ordered Stop   07/06/14 0600  cefOXitin (MEFOXIN) 2 g in dextrose 5 % 50 mL IVPB     2 g 100 mL/hr over 30 Minutes Intravenous On call to O.R. 07/05/14 1548 07/06/14 1030      Assessment/Plan: Problem List: Patient Active Problem List   Diagnosis Date Noted  . Lap assisted left colectomy Oct 2015 07/06/2014  . Malnutrition of moderate degree 07/05/2014  . Renal mass 07/04/2014  . Anemia 07/03/2014  . GIB (gastrointestinal bleeding) 07/03/2014  . Malignancy 07/03/2014    Doing well.  Will offer incentive spirometry.   3 Days Post-Op    LOS: 6 days   Matt B. Hassell Done, MD, [facility] [phone] beeper [phone]  07/09/2014 2:58 PM

## 2014-07-09 NOTE — Plan of Care (Signed)
Problem: Phase II Progression Outcomes Goal: Progress activity as tolerated unless otherwise ordered Outcome: Completed/Met Date Met:  07/09/14 Ambulates around unit x4 per shift using rolling walker.

## 2014-07-09 NOTE — Progress Notes (Signed)
[facility] PROGRESS NOTE  Kirsten Golden KGM:[id number] DOB: August 02, 1924 DOA: 07/03/2014 PCP: No primary provider on file.  Assessment/Plan: Active Problems:   Anemia - pt is s/p transfusion - 2ary to GIB from suspect colon/rectal cancer. [facility] has obtained biopsy which primary care physician shared with biopsy results with family today. - Hemoglobin stable at 9.5    GIB (gastrointestinal bleeding) - Pt is pod # 2 s/p lap left colectomy 07/06/14 - No active bleeding - No more GI bleed reported  Renal mass - Discussed with Urology. Awaiting further work up before deciding which course of action to take.  - Consulted oncologist who recommends hospice  Pulmonary nodules - Could represent metastatic carcinoma. Etiology uncertain at this juncture. - PET scan to be obtained after discharge  Malnutrition of moderate degree - Continue with nutritional supplementation as recommended by registered dietitian   Pt meets criteria for mild/moderate MALNUTRITION in the context of chronic illness as evidenced by >5% weight loss in the past month, <75% intake for > 1 month, and decreased muscle mass.     Code Status: Full Family Communication: Daughter and husband Disposition Plan: Pending recommendations from specialist involved.    Consultants:  Urology  [facility]  Oncologist  Procedures:  None  Antibiotics:  None  HPI/Subjective: No acute issues overnight.  Objective: Filed Vitals:   07/09/14 1300  BP: 143/85  Pulse: 93  Temp: 98 F (36.7 C)  Resp: 16    Intake/Output Summary (Last 24 hours) at 07/09/14 1738 Last data filed at 07/09/14 0501  Gross per 24 hour  Intake      0 ml  Output    650 ml  Net   -650 ml   Filed Weights   07/04/14 0145  Weight: 47.5 kg (104 lb 11.5 oz)    Exam:   General:  Pt in nad, alert and awake  Cardiovascular: rrr, no mrg  Respiratory: cta bl, no wheezes  Abdomen: ND, no guarding  Musculoskeletal: no  cyanosis or clubbing   Data Reviewed: Basic Metabolic Panel:  Recent Labs Lab 07/03/14 1930 07/04/14 0802 07/05/14 0525 07/06/14 0429 07/06/14 1520 07/07/14 0525 07/08/14 0527  NA 131* 139 139 138  --  138 132*  K 3.6* 3.7 4.0 3.9  --  4.5 4.0  CL 94* 101 105 101  --  103 98  CO2  --  25 23 22   --  21 21  GLUCOSE 106* 101* 92 127*  --  147* 153*  BUN 26* 20 18 14   --  20 14  CREATININE 1.60* 0.91 0.80 0.76 0.79 0.97 0.75  CALCIUM  --  9.4 9.8 10.3  --  9.3 9.6  MG  --  2.1  --   --   --   --   --    Liver Function Tests:  Recent Labs Lab 07/03/14 1916 07/04/14 0802 07/05/14 0525 07/06/14 0429  AST 14 14 17 18   ALT 11 9 9 12   ALKPHOS 72 65 68 79  BILITOT 0.2* 0.9 0.5 0.8  PROT 7.0 6.4 6.5 7.5  ALBUMIN 3.6 3.4* 3.4* 3.9    Recent Labs Lab 07/03/14 1916  LIPASE 48   No results found for this basename: AMMONIA,  in the last 168 hours CBC:  Recent Labs Lab 07/04/14 2325 07/05/14 0525 07/05/14 1436 07/05/14 2311 07/06/14 0429 07/06/14 1520 07/07/14 0525 07/08/14 0527  WBC 7.2 7.0 6.9 8.8 9.6 12.1* 15.7* 14.9*  NEUTROABS 5.0 4.3 4.8 6.5 6.6  --   --   --  HGB 10.1* 10.2* 10.0* 10.3* 11.6* 11.3* 9.9* 9.5*  HCT 31.0* 31.9* 31.1* 31.6* 36.6 35.5* 30.7* 29.3*  MCV 72.8* 73.7* 73.3* 73.0* 74.5* 74.1* 73.6* 73.3*  PLT 341 333 357 345 379 365 315 278   Cardiac Enzymes: No results found for this basename: CKTOTAL, CKMB, CKMBINDEX, TROPONINI,  in the last 168 hours BNP (last 3 results) No results found for this basename: PROBNP,  in the last 8760 hours CBG: No results found for this basename: GLUCAP,  in the last 168 hours  Recent Results (from the past 240 hour(s))  SURGICAL PCR SCREEN     Status: None   Collection Time    07/06/14  9:00 AM      Result Value Ref Range Status   MRSA, PCR NEGATIVE  NEGATIVE Final   Staphylococcus aureus NEGATIVE  NEGATIVE Final   Comment:            The Xpert SA Assay (FDA     approved for NASAL specimens     in  patients over 64 years of age),     is one component of     a comprehensive surveillance     program.  Test performance has     been validated by [employer] for patients greater     than or equal to 43 year old.     It is not intended     to diagnose infection nor to     guide or monitor treatment.     Studies: No results found.  Scheduled Meds: . antiseptic oral rinse  7 mL Mouth Rinse BID  . heparin  5,000 Units Subcutaneous 3 times per day  . pantoprazole (PROTONIX) IV  40 mg Intravenous Daily   Continuous Infusions: . sodium chloride 75 mL/hr at 07/09/14 0219    Time spent: > 35 minutes    [name]  [facility] Pager [phone] If 7PM-7AM, please contact night-coverage at [email or web address], password [email or web address] 07/09/2014, 5:38 PM  LOS: 6 days

## 2014-07-10 LAB — BASIC METABOLIC PANEL
ANION GAP: 12 (ref 5–15)
BUN: 19 mg/dL (ref 6–23)
CHLORIDE: 100 meq/L (ref 96–112)
CO2: 18 meq/L — AB (ref 19–32)
Calcium: 10 mg/dL (ref 8.4–10.5)
Creatinine, Ser: 0.91 mg/dL (ref 0.50–1.10)
GFR calc non Af Amer: 55 mL/min — ABNORMAL LOW (ref >90)
GFR, EST AFRICAN AMERICAN: 63 mL/min — AB (ref >90)
Glucose, Bld: 118 mg/dL — ABNORMAL HIGH (ref 70–99)
POTASSIUM: 5.3 meq/L (ref 3.7–5.3)
Sodium: 130 mEq/L — ABNORMAL LOW (ref 137–147)

## 2014-07-10 LAB — PHOSPHORUS: PHOSPHORUS: 2.3 mg/dL (ref 2.3–4.6)

## 2014-07-10 LAB — MAGNESIUM: Magnesium: 1.7 mg/dL (ref 1.5–2.5)

## 2014-07-10 MED ORDER — DILTIAZEM LOAD VIA INFUSION
10.0000 mg | Freq: Once | INTRAVENOUS | Status: AC
  Administered 2014-07-10: 10 mg via INTRAVENOUS
  Filled 2014-07-10: qty 10

## 2014-07-10 MED ORDER — HEPARIN (PORCINE) IN NACL 100-0.45 UNIT/ML-% IJ SOLN
700.0000 [IU]/h | INTRAMUSCULAR | Status: DC
  Administered 2014-07-10: 700 [IU]/h via INTRAVENOUS
  Filled 2014-07-10: qty 250

## 2014-07-10 MED ORDER — DILTIAZEM HCL 100 MG IV SOLR
5.0000 mg/h | INTRAVENOUS | Status: DC
  Administered 2014-07-10 – 2014-07-14 (×15): 15 mg/h via INTRAVENOUS
  Filled 2014-07-10 (×17): qty 100

## 2014-07-10 NOTE — Progress Notes (Signed)
ANTICOAGULATION CONSULT NOTE - Initial Consult  Pharmacy Consult for Heparin Indication: atrial fibrillation  No Known Allergies  Patient Measurements: Height: 4\' 5"  (134.6 cm) Weight: 104 lb 11.5 oz (47.5 kg) IBW/kg (Calculated) : 29.4  Vital Signs: Temp: 97.3 F (36.3 C) (10/20 1327) Temp Source: Oral (10/20 1327) BP: 125/88 mmHg (10/20 1327) Pulse Rate: 97 (10/20 1327)  Labs:  Recent Labs  07/08/14 0527  HGB 9.5*  HCT 29.3*  PLT 278  CREATININE 0.75    Estimated Creatinine Clearance: 28.1 ml/min (by C-G formula based on Cr of 0.75).   Medical History: Past Medical History  Diagnosis Date  . Colon cancer     Medications:  Scheduled:  . antiseptic oral rinse  7 mL Mouth Rinse BID  . pantoprazole (PROTONIX) IV  40 mg Intravenous Daily   Infusions:  . sodium chloride 75 mL/hr at 07/10/14 0358  . diltiazem (CARDIZEM) infusion 15 mg/hr (07/10/14 1524)    Assessment:  80 yr female with colon cancer s/p recent lap colectomy.  Pt with anemia due to GI bleed suspected from cancer. Today is post op day #4.  No active bleeding noted.  Patient with AFib and IV heparin per pharmacy dosing ordered.  Previously, pt had been on heparin 5000 units sq q8h for VTE prophylaxis.  Most recent dose given @ 14:51 today; therefore no bolus needed prior to initiating heparin drip  Goal of Therapy:  Heparin level 0.3-0.7 units/ml Monitor platelets by anticoagulation protocol: Yes   Plan:   No heparin bolus (due to recent heparin 5K sq dose given)  Begin IV heparin infusion @ 700 units/hr  Check heparin level 8 hr after heparin infusion started  Follow heparin level & CBC daily while on heparin  Ariba Lehnen, Toribio Harbour, PharmD 07/10/2014,4:34 PM

## 2014-07-10 NOTE — Care Management Note (Addendum)
    Page 1 of 2   07/16/2014     2:48:53 PM CARE MANAGEMENT NOTE 07/16/2014  Patient:  Kirsten Golden,Kirsten Golden   Account Number:  [id number]  Date Initiated:  07/06/2014  Documentation initiated by:  Marney Doctor  Subjective/Objective Assessment:   80 yo admitted with anemia.  Hx of colon ca     Action/Plan:   From home with children   Anticipated DC Date:  07/16/2014   Anticipated DC Plan:  [place] referral  Interpreting [place]  CM consult      Choice offered to / List presented to:  C-4 Adult Children           [place].   Status of service:  In process, will continue to follow Medicare Important Message given?   (If response is "NO", the following Medicare IM given date fields will be blank) Date Medicare IM given:   Medicare IM given by:   Date Additional Medicare IM given:   Additional Medicare IM given by:    Discharge Disposition:    Per UR Regulation:  Reviewed for med. necessity/level of care/duration of stay  If discussed at [place] of Stay Meetings, dates discussed:   07/10/2014  07/12/2014    Comments:  07/16/14 Gearald Stonebraker RN,BSN NCM [phone] [facility] CHOSEN BY DTR-[name].TC [facility] MARGIE LIASON-AWARE OF D/C TODAY & DME-WILL ARRANGE.DTR WILL TRANSPORT HOME ON OWN.  07/12/14 Kiersten Coss RN,BSN NCM [phone] CLEARS,AWAIT PALLIATIVE EVAL, & RECOMMENDATIONS.[facility] FOLLOWING.  07/11/14 Avilyn Virtue RN,BSN NCM [phone] PALLIATIVE CARE CONS FOR EOL CARE-INTERPRETING SERVICES NEEDED,AWAIT RECOMMENDATIONS.  07/10/14 Eunie Lawn RN,BSN NCM [phone] 4P-[facility] CHOSEN BY DTR IF [facility] SAFETY EVAL NEEDED.TC KRISTEN REP AWARE.WILL NEED RW FOR HOME.DTR PLANS TO D/C HER MOTHER BACK HOME,PLESANTLY DECLINES SNF IF IT IS RECOMMENDED.  TRANSFER FROM 3W FOR AFIB-CARDIZEM GTT.POD#4 COLECTOMY.NO FLATUS.CLEARS.RECOMMEND PT CONS.WILL PROVIDE HHC LIST.  07/06/14 Marney Doctor RN,BSN,NCM Chart being  reviewed for CM needs.  Pt for colectomy today. CM will continue to follow for DC needs.  Could Potentially need PT eval for home recommendations.

## 2014-07-10 NOTE — Progress Notes (Signed)
Transfer note: Notified by RN at approx 2215 regarding irregular and rapid HR. 12-Lead EKG was obtained and revealed A-Fib w/ RVR w/ rate 148 sustained. BP 144/90. Remaining VSS. Pt asymptomatic. Transferred to telemetry room 1406. Upon arrival to floor pt rec'd Metoprolol 5mg  IV which lowered rate initially but trended back to 140-150's. Pt then rec'd Cardizem 10 mg IV bolus and placed on Cardizem drip at 5 mg/hr. She remained asymptomatic through-out. While Cardizem qtt at 10 mg/hr HR baseline in the 115-120 range though continues to have brief burst into 130-140's. RN increased Cardizem qtt to 15 mg/hr and HR currently low hundreds w/ occasional burst to 120-130's. BP-114/84, remaining VSS.  Assessment/Plan: 1. A-Fib w/ RVR (New) Likely precipitated by acute illness, age and recent surgery.  Pt asymptomatic. Some improvement in rate w/ Cardizem qtt. Continue Cardizem qtt. Will defer anticoagulation for now given recent surgery and GI bleed. Pt also anemic w/ Hb stable at 9.5. Will continue to monitor closely on telemetry.   Jeryl Columbia, NP-C [facility] Pager [phone]

## 2014-07-10 NOTE — Progress Notes (Signed)
Patient ID: Kirsten Golden, female   DOB: March 19, 1924, 80 y.o.   MRN: [id number] [facility] Progress Note:   4 Days Post-Op  Subjective: Mental status is clear Objective: Vital signs in last 24 hours: Temp:  [97.3 F (36.3 C)-98.1 F (36.7 C)] 97.3 F (36.3 C) (10/20 1327) Pulse Rate:  [97-175] 97 (10/20 1327) Resp:  [16-18] 16 (10/20 1327) BP: (114-153)/(80-101) 125/88 mmHg (10/20 1327) SpO2:  [97 %-100 %] 97 % (10/20 1327)  Intake/Output from previous day: 10/19 0701 - 10/20 0700 In: 2510 [I.V.:2510] Out: 150 [Urine:150] Intake/Output this shift:    Physical Exam: Work of breathing is not elevated.  More confused last night.  Was transferred to monitored bed because of atrial fibrillation.    Lab Results:  No results found for this or any previous visit (from the past 48 hour(s)).  Radiology/Results: No results found.  Anti-infectives: Anti-infectives   Start     Dose/Rate Route Frequency Ordered Stop   07/06/14 0600  cefOXitin (MEFOXIN) 2 g in dextrose 5 % 50 mL IVPB     2 g 100 mL/hr over 30 Minutes Intravenous On call to O.R. 07/05/14 1548 07/06/14 1030      Assessment/Plan: Problem List: Patient Active Problem List   Diagnosis Date Noted  . Lap assisted left colectomy Oct 2015 07/06/2014  . Malnutrition of moderate degree 07/05/2014  . Renal mass 07/04/2014  . Anemia 07/03/2014  . GIB (gastrointestinal bleeding) 07/03/2014  . Malignancy 07/03/2014    Path T3N0 cancer of the colon.  No flatus yet.  Atrial fib-new 4 Days Post-Op    LOS: 7 days   Matt B. Hassell Done, MD, [facility] [phone] beeper [phone]  07/10/2014 1:38 PM

## 2014-07-10 NOTE — Progress Notes (Signed)
[facility] PROGRESS NOTE  Kirsten Golden SWF:[id number] DOB: 1924/04/26 DOA: 07/03/2014 PCP: No primary provider on file. Brief Narrative: 80 y.o. year old female with significant past medical history of colon cancer who presented with anemia. Pt is originally from the middle [place] and speaks only farsi. Per the son, pt was recently diagnosed with rectal/colon cancer. Was seen by [facility] with recent biopsy. Had CT abd/pelvis and chest. Had Enhancing 4.5 cm left upper renal pole cortical mass, highly suspicious for renal cell carcinoma and pulmonary nodules. Patient was at risk for obstruction. Given narrowing at transverse colon. As such patient is status post hemicolectomy. Oncology consulted for further planning given above history.   Assessment/Plan: Atrial fibrillation - Patient currently on Cardizem drip. - Currently unable to take anything by mouth secondary to recent operation - Will place patient on heparin drip given A. fib with Mali score of 2  Active Problems:   Anemia - pt is s/p transfusion - 2ary to GIB from suspect colon/rectal cancer. Patient is status post lap colectomy - Hemoglobin stable at 9.5 on last check - reassess hgb levels next am.    GIB (gastrointestinal bleeding) - Pt is pod # 4 s/p lap left colectomy 07/06/14 - No active bleeding - No more GI bleed reported - Still not passing flatus  Renal mass - Discussed with Urology. Awaiting further work up before deciding which course of action to take.  - Consulted oncologist who recommended hospice  Pulmonary nodules - Could represent metastatic carcinoma. Etiology uncertain at this juncture. - PET scan to be obtained after discharge  Malnutrition of moderate degree - Continue with nutritional supplementation as recommended by registered dietitian   Pt meets criteria for mild/moderate MALNUTRITION in the context of chronic illness as evidenced by >5% weight loss in the past  month, <75% intake for > 1 month, and decreased muscle mass.    Code Status: Full Family Communication: Daughter and husband Disposition Plan: Pending recommendations from specialist involved.    Consultants:  Urology  CCS  Oncologist  Procedures:  None  Antibiotics:  None  HPI/Subjective: Patient had atrial fibrillation overnight  Objective: Filed Vitals:   07/10/14 1327  BP: 125/88  Pulse: 97  Temp: 97.3 F (36.3 C)  Resp: 16    Intake/Output Summary (Last 24 hours) at 07/10/14 1603 Last data filed at 07/10/14 0600  Gross per 24 hour  Intake   2510 ml  Output    150 ml  Net   2360 ml   Filed Weights   07/04/14 0145  Weight: 47.5 kg (104 lb 11.5 oz)    Exam:   General:  Pt in nad, alert and awake  Cardiovascular: S1 and s2 present, no mrg  Respiratory: cta bl, no wheezes  Abdomen: ND, no guarding  Musculoskeletal: no cyanosis or clubbing   Data Reviewed: Basic Metabolic Panel:  Recent Labs Lab 07/03/14 1930 07/04/14 0802 07/05/14 0525 07/06/14 0429 07/06/14 1520 07/07/14 0525 07/08/14 0527  NA 131* 139 139 138  --  138 132*  K 3.6* 3.7 4.0 3.9  --  4.5 4.0  CL 94* 101 105 101  --  103 98  CO2  --  25 23 22   --  21 21  GLUCOSE 106* 101* 92 127*  --  147* 153*  BUN 26* 20 18 14   --  20 14  CREATININE 1.60* 0.91 0.80 0.76 0.79 0.97 0.75  CALCIUM  --  9.4 9.8 10.3  --  9.3 9.6  MG  --  2.1  --   --   --   --   --    Liver Function Tests:  Recent Labs Lab 07/03/14 1916 07/04/14 0802 07/05/14 0525 07/06/14 0429  AST 14 14 17 18   ALT 11 9 9 12   ALKPHOS 72 65 68 79  BILITOT 0.2* 0.9 0.5 0.8  PROT 7.0 6.4 6.5 7.5  ALBUMIN 3.6 3.4* 3.4* 3.9    Recent Labs Lab 07/03/14 1916  LIPASE 48   No results found for this basename: AMMONIA,  in the last 168 hours CBC:  Recent Labs Lab 07/04/14 2325 07/05/14 0525 07/05/14 1436 07/05/14 2311 07/06/14 0429 07/06/14 1520 07/07/14 0525 07/08/14 0527  WBC 7.2 7.0 6.9 8.8 9.6  12.1* 15.7* 14.9*  NEUTROABS 5.0 4.3 4.8 6.5 6.6  --   --   --   HGB 10.1* 10.2* 10.0* 10.3* 11.6* 11.3* 9.9* 9.5*  HCT 31.0* 31.9* 31.1* 31.6* 36.6 35.5* 30.7* 29.3*  MCV 72.8* 73.7* 73.3* 73.0* 74.5* 74.1* 73.6* 73.3*  PLT 341 333 357 345 379 365 315 278   Cardiac Enzymes: No results found for this basename: CKTOTAL, CKMB, CKMBINDEX, TROPONINI,  in the last 168 hours BNP (last 3 results) No results found for this basename: PROBNP,  in the last 8760 hours CBG: No results found for this basename: GLUCAP,  in the last 168 hours  Recent Results (from the past 240 hour(s))  SURGICAL PCR SCREEN     Status: None   Collection Time    07/06/14  9:00 AM      Result Value Ref Range Status   MRSA, PCR NEGATIVE  NEGATIVE Final   Staphylococcus aureus NEGATIVE  NEGATIVE Final   Comment:            The Xpert SA Assay (FDA     approved for NASAL specimens     in patients over 31 years of age),     is one component of     a comprehensive surveillance     program.  Test performance has     been validated by [employer] for patients greater     than or equal to 41 year old.     It is not intended     to diagnose infection nor to     guide or monitor treatment.     Studies: No results found.  Scheduled Meds: . antiseptic oral rinse  7 mL Mouth Rinse BID  . heparin  5,000 Units Subcutaneous 3 times per day  . pantoprazole (PROTONIX) IV  40 mg Intravenous Daily   Continuous Infusions: . sodium chloride 75 mL/hr at 07/10/14 0358  . diltiazem (CARDIZEM) infusion 15 mg/hr (07/10/14 1524)    Time spent: > 35 minutes    [name]  [facility] Pager [phone] If 7PM-7AM, please contact night-coverage at [email or web address], password [facility] 07/10/2014, 4:03 PM  LOS: 7 days

## 2014-07-10 NOTE — Progress Notes (Signed)
Interpreter phones ineffective way of communicating with pt due to hearing loss on L ear & could only hear 30% on R ear per daughter.

## 2014-07-11 DIAGNOSIS — Z9889 Other specified postprocedural states: Secondary | ICD-10-CM

## 2014-07-11 DIAGNOSIS — D649 Anemia, unspecified: Secondary | ICD-10-CM

## 2014-07-11 DIAGNOSIS — C801 Malignant (primary) neoplasm, unspecified: Secondary | ICD-10-CM

## 2014-07-11 DIAGNOSIS — E440 Moderate protein-calorie malnutrition: Secondary | ICD-10-CM

## 2014-07-11 LAB — CBC
HCT: 24.2 % — ABNORMAL LOW (ref 36.0–46.0)
Hemoglobin: 8 g/dL — ABNORMAL LOW (ref 12.0–15.0)
MCH: 24.3 pg — ABNORMAL LOW (ref 26.0–34.0)
MCHC: 33.1 g/dL (ref 30.0–36.0)
MCV: 73.6 fL — ABNORMAL LOW (ref 78.0–100.0)
PLATELETS: 287 10*3/uL (ref 150–400)
RBC: 3.29 MIL/uL — ABNORMAL LOW (ref 3.87–5.11)
RDW: 22.3 % — ABNORMAL HIGH (ref 11.5–15.5)
WBC: 8.4 10*3/uL (ref 4.0–10.5)

## 2014-07-11 LAB — HEPARIN LEVEL (UNFRACTIONATED)
HEPARIN UNFRACTIONATED: 0.35 [IU]/mL (ref 0.30–0.70)
HEPARIN UNFRACTIONATED: 0.17 [IU]/mL — AB (ref 0.30–0.70)
HEPARIN UNFRACTIONATED: 0.22 [IU]/mL — AB (ref 0.30–0.70)

## 2014-07-11 MED ORDER — HEPARIN (PORCINE) IN NACL 100-0.45 UNIT/ML-% IJ SOLN
800.0000 [IU]/h | INTRAMUSCULAR | Status: DC
Start: 2014-07-11 — End: 2014-07-11
  Filled 2014-07-11: qty 250

## 2014-07-11 MED ORDER — HEPARIN (PORCINE) IN NACL 100-0.45 UNIT/ML-% IJ SOLN
950.0000 [IU]/h | INTRAMUSCULAR | Status: DC
  Administered 2014-07-11: 950 [IU]/h via INTRAVENOUS
  Filled 2014-07-11 (×2): qty 250

## 2014-07-11 NOTE — Progress Notes (Signed)
Pharmacy Consult Note - Follow Up  See previous progress note from Reuel Boom, PharmD for full details.  Heparin level is subtherapeutic 0.17 on 800 units/hr No IV site interruptions per RN, verified correct rate on IV pump No bleeding reported per RN  Plan:  Increase heparin to 950 units/hr  Recheck heparin level ~8 hours  Peggyann Juba, PharmD, BCPS Pharmacy: [phone] 07/11/2014 6:22 PM

## 2014-07-11 NOTE — Progress Notes (Signed)
Patient ID: Kirsten Golden, female   DOB: 1924/05/17, 80 y.o.   MRN: [id number] [facility] Surgery Progress Note:   5 Days Post-Op  Subjective: Mental status is clearer.  No flatus yet Objective: Vital signs in last 24 hours: Temp:  [97.7 F (36.5 C)-98.1 F (36.7 C)] 98.1 F (36.7 C) (10/21 2222) Pulse Rate:  [84-139] 139 (10/21 2222) Resp:  [16-18] 18 (10/21 2222) BP: (123-133)/(87-95) 133/87 mmHg (10/21 2222) SpO2:  [95 %-97 %] 95 % (10/21 2222)  Intake/Output from previous day: 10/20 0701 - 10/21 0700 In: 36.1 [I.V.:36.1] Out: -  Intake/Output this shift:    Physical Exam: Work of breathing is not labored.  Checked bottom and chronic soft hemorrhoids.  No flatus this aml  Lab Results:  Results for orders placed during the hospital encounter of 07/03/14 (from the past 48 hour(s))  BASIC METABOLIC PANEL     Status: Abnormal   Collection Time    07/10/14  4:40 PM      Result Value Ref Range   Sodium 130 (*) 137 - 147 mEq/L   Potassium 5.3  3.7 - 5.3 mEq/L   Chloride 100  96 - 112 mEq/L   CO2 18 (*) 19 - 32 mEq/L   Glucose, Bld 118 (*) 70 - 99 mg/dL   BUN 19  6 - 23 mg/dL   Creatinine, Ser 0.91  0.50 - 1.10 mg/dL   Calcium 10.0  8.4 - 10.5 mg/dL   GFR calc non Af Amer 55 (*) >90 mL/min   GFR calc Af Amer 63 (*) >90 mL/min   Comment: (NOTE)     The eGFR has been calculated using the CKD EPI equation.     This calculation has not been validated in all clinical situations.     eGFR's persistently <90 mL/min signify possible Chronic Kidney     Disease.   Anion gap 12  5 - 15  MAGNESIUM     Status: None   Collection Time    07/10/14  4:40 PM      Result Value Ref Range   Magnesium 1.7  1.5 - 2.5 mg/dL  PHOSPHORUS     Status: None   Collection Time    07/10/14  4:40 PM      Result Value Ref Range   Phosphorus 2.3  2.3 - 4.6 mg/dL  HEPARIN LEVEL (UNFRACTIONATED)     Status: Abnormal   Collection Time    07/11/14 12:35 AM      Result Value Ref Range    Heparin Unfractionated 0.22 (*) 0.30 - 0.70 IU/mL   Comment:            IF HEPARIN RESULTS ARE BELOW     EXPECTED VALUES, AND PATIENT     DOSAGE HAS BEEN CONFIRMED,     SUGGEST FOLLOW UP TESTING     OF ANTITHROMBIN III LEVELS.  CBC     Status: Abnormal   Collection Time    07/11/14 12:35 AM      Result Value Ref Range   WBC 8.4  4.0 - 10.5 K/uL   RBC 3.29 (*) 3.87 - 5.11 MIL/uL   Hemoglobin 8.0 (*) 12.0 - 15.0 g/dL   HCT 24.2 (*) 36.0 - 46.0 %   MCV 73.6 (*) 78.0 - 100.0 fL   MCH 24.3 (*) 26.0 - 34.0 pg   MCHC 33.1  30.0 - 36.0 g/dL   RDW 22.3 (*) 11.5 - 15.5 %   Platelets 287  150 -  400 K/uL  HEPARIN LEVEL (UNFRACTIONATED)     Status: None   Collection Time    07/11/14 10:06 AM      Result Value Ref Range   Heparin Unfractionated 0.35  0.30 - 0.70 IU/mL   Comment:            IF HEPARIN RESULTS ARE BELOW     EXPECTED VALUES, AND PATIENT     DOSAGE HAS BEEN CONFIRMED,     SUGGEST FOLLOW UP TESTING     OF ANTITHROMBIN III LEVELS.  HEPARIN LEVEL (UNFRACTIONATED)     Status: Abnormal   Collection Time    07/11/14  5:49 PM      Result Value Ref Range   Heparin Unfractionated 0.17 (*) 0.30 - 0.70 IU/mL   Comment:            IF HEPARIN RESULTS ARE BELOW     EXPECTED VALUES, AND PATIENT     DOSAGE HAS BEEN CONFIRMED,     SUGGEST FOLLOW UP TESTING     OF ANTITHROMBIN III LEVELS.    Radiology/Results: No results found.  Anti-infectives: Anti-infectives   Start     Dose/Rate Route Frequency Ordered Stop   07/06/14 0600  cefOXitin (MEFOXIN) 2 g in dextrose 5 % 50 mL IVPB     2 g 100 mL/hr over 30 Minutes Intravenous On call to O.R. 07/05/14 1548 07/06/14 1030      Assessment/Plan: Problem List: Patient Active Problem List   Diagnosis Date Noted  . Lap assisted left colectomy Oct 2015 07/06/2014  . Malnutrition of moderate degree 07/05/2014  . Renal mass 07/04/2014  . Anemia 07/03/2014  . GIB (gastrointestinal bleeding) 07/03/2014  . Malignancy 07/03/2014     Stable postop.  No flatus or BM as of this am.  Path shows nodes negative but locally advanced.   5 Days Post-Op    LOS: 8 days   Matt B. Hassell Done, MD, [facility] [phone] beeper [phone]  07/11/2014 11:41 PM

## 2014-07-11 NOTE — Progress Notes (Signed)
ANTICOAGULATION CONSULT NOTE - Follow-Up  Pharmacy Consult for Heparin Indication: atrial fibrillation  Assessment: 80 yr female with colon cancer s/p recent lap colectomy.  Pt with anemia due to GI bleed suspected from cancer. Today is post op day #4.  No active bleeding noted.  Patient with AFib and IV heparin per pharmacy dosing ordered.  Transfused 2 units PRBC on admission  Plt wnl Hgb low, trending down (partly dilutional? - patient almost 3L positive since admission) Renal: SCr stable between 0.8-1; CrCl 25 ml/min  Goal of Therapy:  Heparin level 0.3-0.7 units/ml Monitor platelets by anticoagulation protocol: Yes   Plan:   Continue heparin at 800 units/hr  Check confirmatory heparin level in 8 hrs, may transition to daily HL if stable  Follow heparin level & CBC daily while on heparin  ------------------------------------------------------------------------------------- Heparin levels/infusion rates 10/20: HL 0.22 on 700 units/hr 10/21: HL 0.35 on 800 units/hr  No Known Allergies  Patient Measurements: Height: 4\' 5"  (134.6 cm) Weight: 104 lb 11.5 oz (47.5 kg) IBW/kg (Calculated) : 29.4  Vital Signs: Temp: 97.7 F (36.5 C) (10/21 0535) Temp Source: Oral (10/21 0535) BP: 123/95 mmHg (10/21 0535) Pulse Rate: 84 (10/21 0535)  Labs:  Recent Labs  07/10/14 1640 07/11/14 0035 07/11/14 1006  HGB  --  8.0*  --   HCT  --  24.2*  --   PLT  --  287  --   HEPARINUNFRC  --  0.22* 0.35  CREATININE 0.91  --   --     Estimated Creatinine Clearance: 24.7 ml/min (by C-G formula based on Cr of 0.91).   Medical History: Past Medical History  Diagnosis Date  . Colon cancer     Medications:  Scheduled:  . antiseptic oral rinse  7 mL Mouth Rinse BID  . pantoprazole (PROTONIX) IV  40 mg Intravenous Daily   Infusions:  . sodium chloride 75 mL/hr at 07/11/14 0542  . diltiazem (CARDIZEM) infusion 15 mg/hr (07/11/14 0546)  . heparin 800 Units/hr (07/11/14 0203)     Reuel Boom, PharmD Pager: [phone] 07/11/2014, 11:02 AM

## 2014-07-11 NOTE — Progress Notes (Signed)
ANTICOAGULATION CONSULT NOTE - Follow Up Consult  Pharmacy Consult for heparin Indication: atrial fibrillation  No Known Allergies  Patient Measurements: Height: 4\' 5"  (134.6 cm) Weight: 104 lb 11.5 oz (47.5 kg) IBW/kg (Calculated) : 29.4 Heparin Dosing Weight:   Vital Signs: Temp: 98 F (36.7 C) (10/20 2137) Temp Source: Oral (10/20 2137) BP: 107/68 mmHg (10/20 2137) Pulse Rate: 96 (10/20 2137)  Labs:  Recent Labs  07/08/14 0527 07/10/14 1640 07/11/14 0035  HGB 9.5*  --  8.0*  HCT 29.3*  --  24.2*  PLT 278  --  287  HEPARINUNFRC  --   --  0.22*  CREATININE 0.75 0.91  --     Estimated Creatinine Clearance: 24.7 ml/min (by C-G formula based on Cr of 0.91).   Medications:  Infusions:  . sodium chloride 75 mL/hr at 07/10/14 0358  . diltiazem (CARDIZEM) infusion 15 mg/hr (07/10/14 2235)  . heparin 800 Units/hr (07/11/14 0203)    Assessment: Patient with low heparin level.  No issues per RN.  Goal of Therapy:  Heparin level 0.3-0.7 units/ml Monitor platelets by anticoagulation protocol: Yes   Plan:  Increase heparin to 800 units/hr Recheck level at [place], Shea [place] 07/11/2014,5:14 AM

## 2014-07-11 NOTE — Progress Notes (Signed)
Palliative Medicine Team consult received. Newly diagnosed Colon Cancer and kidney mass with Metastatic disease in frail 80yo woman. Chart reviewed and noted family requests regarding how information is delivered to the patient. Also noted need for translator/patient speaks Farsi.Dr. Lindi Adie following from oncology and recommending Hospice for EOL care. Will determine current needs and arrange for a translator and goals of care meeting.  Lane Hacker, DO Palliative Medicine [phone]

## 2014-07-11 NOTE — Progress Notes (Signed)
[facility] PROGRESS NOTE  Kirsten Golden SFK:[id number] DOB: September 24, 1923 DOA: 07/03/2014 PCP: No primary provider on file. Brief Narrative: 80 y.o. year old female with significant past medical history of colon cancer who presented with anemia. Pt is originally from the middle [place] and speaks only farsi. Per the son, pt was recently diagnosed with rectal/colon cancer. Was seen by [facility] with recent biopsy. Had CT abd/pelvis and chest. Had Enhancing 4.5 cm left upper renal pole cortical mass, highly suspicious for renal cell carcinoma and pulmonary nodules presumably metastatic disease. Patient was at risk for obstruction. Given narrowing at transverse colon. As such patient is status post hemicolectomy. Oncology consulted for further planning given above history. GOC by palliative care requested. Family wants patient to be DNR.  Assessment/Plan: Atrial fibrillation - Patient currently on Cardizem drip, which will continue for now. - Currently unable to take anything by mouth secondary to post operative ileus  - Mali score of >2 and elevated risk for hypercoagulable state given cancer. Continue heparin  Anemia - pt is s/p transfusion - 2ary to GIB from suspect colon/rectal cancer and blood loss during surgery. Patient is status post lap colectomy - Hemoglobin has remained stable - reassess hgb levels intermittently and transfuse for Hgb < 7.5   GIB (gastrointestinal bleeding) and ABLA - Pt is pod # 4 s/p lap left colectomy 07/06/14 - No further active bleeding appreciated - will follow Hgb intermittently for now -follow [place] meeting results  Renal mass - Discussed with Urology. Awaiting further work up before deciding which course of action to take.  - Consulted oncologist who recommended hospice for EOL care  Pulmonary nodules - Could represent metastatic carcinoma.  - PET scan to be obtained after discharge if plan is for further care; if recommendations  and plans are for comfort care no further work up indicated  Malnutrition of moderate degree - Currently not tolerating much due to postoperative ileus -will follow [place] meeting outcome, decisions and rec's -follow dietician rec's   Pt meets criteria for mild/moderate MALNUTRITION in the context of chronic illness as evidenced by >5% weight loss in the past month, <75% intake for > 1 month, and decreased muscle mass.    Code Status: DNR Family Communication: Daughter and husband Disposition Plan: Pending [place] meeting outcome    Consultants:  Urology  CCS  Oncologist  Palliative care  Procedures:  hemicolectomy  Antibiotics:  None  HPI/Subjective: Patient HR/rhythm has remained irregular, complaining of abd pain, distension and mild SOB. No fever. No BM's and no flatus.  Objective: Filed Vitals:   07/11/14 1309  BP: 123/89  Pulse: 115  Temp: 98 F (36.7 C)  Resp: 18    Intake/Output Summary (Last 24 hours) at 07/11/14 2116 Last data filed at 07/11/14 1700  Gross per 24 hour  Intake 816.05 ml  Output      0 ml  Net 816.05 ml   Filed Weights   07/04/14 0145  Weight: 47.5 kg (104 lb 11.5 oz)    Exam:   General:  Pt afebrile, complaining of abd pain, distension and mild SOB  Cardiovascular: no rubs or gallops, positive soft SEM, irregular rate  Respiratory: scattered rhonchi, otherwise clear.  Abdomen: tender to palpation, no BS appreciated, positive distension  Musculoskeletal: no cyanosis or clubbing   Data Reviewed: Basic Metabolic Panel:  Recent Labs Lab 07/05/14 0525 07/06/14 0429 07/06/14 1520 07/07/14 0525 07/08/14 0527 07/10/14 1640  NA 139 138  --  138 132* 130*  K  4.0 3.9  --  4.5 4.0 5.3  CL 105 101  --  103 98 100  CO2 23 22  --  21 21 18*  GLUCOSE 92 127*  --  147* 153* 118*  BUN 18 14  --  20 14 19   CREATININE 0.80 0.76 0.79 0.97 0.75 0.91  CALCIUM 9.8 10.3  --  9.3 9.6 10.0  MG  --   --   --   --   --  1.7  PHOS  --    --   --   --   --  2.3   Liver Function Tests:  Recent Labs Lab 07/05/14 0525 07/06/14 0429  AST 17 18  ALT 9 12  ALKPHOS 68 79  BILITOT 0.5 0.8  PROT 6.5 7.5  ALBUMIN 3.4* 3.9   CBC:  Recent Labs Lab 07/04/14 2325 07/05/14 0525 07/05/14 1436 07/05/14 2311 07/06/14 0429 07/06/14 1520 07/07/14 0525 07/08/14 0527 07/11/14 0035  WBC 7.2 7.0 6.9 8.8 9.6 12.1* 15.7* 14.9* 8.4  NEUTROABS 5.0 4.3 4.8 6.5 6.6  --   --   --   --   HGB 10.1* 10.2* 10.0* 10.3* 11.6* 11.3* 9.9* 9.5* 8.0*  HCT 31.0* 31.9* 31.1* 31.6* 36.6 35.5* 30.7* 29.3* 24.2*  MCV 72.8* 73.7* 73.3* 73.0* 74.5* 74.1* 73.6* 73.3* 73.6*  PLT 341 333 357 345 379 365 315 278 287   CBG: No results found for this basename: GLUCAP,  in the last 168 hours  Recent Results (from the past 240 hour(s))  SURGICAL PCR SCREEN     Status: None   Collection Time    07/06/14  9:00 AM      Result Value Ref Range Status   MRSA, PCR NEGATIVE  NEGATIVE Final   Staphylococcus aureus NEGATIVE  NEGATIVE Final   Comment:            The Xpert SA Assay (FDA     approved for NASAL specimens     in patients over 62 years of age),     is one component of     a comprehensive surveillance     program.  Test performance has     been validated by [employer] for patients greater     than or equal to 64 year old.     It is not intended     to diagnose infection nor to     guide or monitor treatment.     Studies: No results found.  Scheduled Meds: . antiseptic oral rinse  7 mL Mouth Rinse BID  . pantoprazole (PROTONIX) IV  40 mg Intravenous Daily   Continuous Infusions: . sodium chloride 75 mL/hr at 07/11/14 0542  . diltiazem (CARDIZEM) infusion 15 mg/hr (07/11/14 2037)  . heparin 950 Units/hr (07/11/14 2038)    Time spent: 35 minutes (more than 50 % dedicated to face to face care and discussion with family regarding prognosis, treatment options, updates and discussion of code status)    Barton Dubois  [facility] Pager [phone] If 7PM-7AM, please contact night-coverage at [email or web address], password [facility] 07/11/2014, 9:16 PM  LOS: 8 days

## 2014-07-12 LAB — HEPARIN LEVEL (UNFRACTIONATED)
Heparin Unfractionated: 0.31 IU/mL (ref 0.30–0.70)
Heparin Unfractionated: 0.31 IU/mL (ref 0.30–0.70)

## 2014-07-12 LAB — CBC
HCT: 26.7 % — ABNORMAL LOW (ref 36.0–46.0)
Hemoglobin: 9 g/dL — ABNORMAL LOW (ref 12.0–15.0)
MCH: 24.5 pg — AB (ref 26.0–34.0)
MCHC: 33.7 g/dL (ref 30.0–36.0)
MCV: 72.8 fL — ABNORMAL LOW (ref 78.0–100.0)
Platelets: 382 10*3/uL (ref 150–400)
RBC: 3.67 MIL/uL — ABNORMAL LOW (ref 3.87–5.11)
RDW: 22.5 % — AB (ref 11.5–15.5)
WBC: 10.3 10*3/uL (ref 4.0–10.5)

## 2014-07-12 MED ORDER — HEPARIN (PORCINE) IN NACL 100-0.45 UNIT/ML-% IJ SOLN
1000.0000 [IU]/h | INTRAMUSCULAR | Status: DC
  Administered 2014-07-12 – 2014-07-13 (×2): 1000 [IU]/h via INTRAVENOUS
  Filled 2014-07-12 (×4): qty 250

## 2014-07-12 MED ORDER — LORAZEPAM 2 MG/ML IJ SOLN
0.5000 mg | Freq: Two times a day (BID) | INTRAMUSCULAR | Status: DC | PRN
  Administered 2014-07-12 – 2014-07-15 (×4): 0.5 mg via INTRAVENOUS
  Filled 2014-07-12 (×4): qty 1

## 2014-07-12 NOTE — Progress Notes (Signed)
ANTICOAGULATION CONSULT NOTE - Follow Up Consult  Pharmacy Consult for Heparin Indication: atrial fibrillation  No Known Allergies  Patient Measurements: Height: 4\' 5"  (134.6 cm) Weight: 104 lb 11.5 oz (47.5 kg) IBW/kg (Calculated) : 29.4 Heparin Dosing Weight: 40 kg  Vital Signs: Temp: 98.2 F (36.8 C) (10/22 1352) Temp Source: Oral (10/22 1352) BP: 122/70 mmHg (10/22 1352) Pulse Rate: 104 (10/22 1352)  Labs:  Recent Labs  07/10/14 1640  07/11/14 0035 07/11/14 1006 07/11/14 1749 07/12/14 0354  HGB  --   --  8.0*  --   --  9.0*  HCT  --   --  24.2*  --   --  26.7*  PLT  --   --  287  --   --  382  HEPARINUNFRC  --   < > 0.22* 0.35 0.17* <0.10*  CREATININE 0.91  --   --   --   --   --   < > = values in this interval not displayed.  Estimated Creatinine Clearance: 24.7 ml/min (by C-G formula based on Cr of 0.91).   Medications:  Infusions:  . sodium chloride 75 mL/hr at 07/11/14 0542  . diltiazem (CARDIZEM) infusion 15 mg/hr (07/12/14 1057)  . heparin 1,000 Units/hr (07/12/14 0545)    Assessment: 89 yoF admitted on 10/13 with anemia and recent colon cancer diagnosis; s/p lap left colectomy on 07/06/14.  Suspected renal cell carcinoma and pulmonary nodules.  She developed Afib with RVR on 10/19 PM and pharmacy was consulted to dose Heparin IV on 10/20.  Today, 10/22  Heparin Level: 0.31, therapeutic  CBC: Hgb 9, Plt 382  Last transfusion was on 10/14  No bleeding reported or documented   Goal of Therapy:  Heparin level 0.3-0.7 units/ml Monitor platelets by anticoagulation protocol: Yes   Plan:   Continue heparin IV infusion at 1000 units/hr  Heparin level in 8 hours to confirm therapeutic level  Daily heparin level and CBC  Continue to monitor H&H and platelets  Follow up long-term anticoagulation plan and palliative care consult.     Gretta Arab PharmD, BCPS Pager [phone] 07/12/2014 3:21 PM

## 2014-07-12 NOTE — Progress Notes (Signed)
[facility] PROGRESS NOTE  Madhavi Hamblen KZS:[id number] DOB: 1924-06-27 DOA: 07/03/2014 PCP: No primary provider on file. Brief Narrative: 80 y.o. year old female with significant past medical history of colon cancer who presented with anemia. Pt is originally from the middle [place] and speaks only farsi. Per the son, pt was recently diagnosed with rectal/colon cancer. Was seen by [facility] Gastroenterology with recent biopsy. Had CT abd/pelvis and chest. Had Enhancing 4.5 cm left upper renal pole cortical mass, highly suspicious for renal cell carcinoma and pulmonary nodules presumably metastatic disease. Patient was at risk for obstruction. Given narrowing at transverse colon. As such patient is status post hemicolectomy. Oncology consulted for further planning given above history. GOC by palliative care requested. Family wants patient to be DNR.  Assessment/Plan: Atrial fibrillation - Patient currently on Cardizem drip, which will continue for now. - Currently unable to take anything by mouth secondary to post operative ileus  - Mali score of >2 and elevated risk for hypercoagulable state given cancer. Continue heparin  Anemia - pt is s/p transfusion - 2ary to GIB from suspect colon/rectal cancer and blood loss during surgery. Patient is status post lap colectomy - Hemoglobin has remained stable - reassess hgb levels intermittently and transfuse for Hgb < 7.5   GIB (gastrointestinal bleeding) and ABLA - Pt is pod # 4 s/p lap left colectomy 07/06/14 - No further active bleeding appreciated - will follow Hgb intermittently for now -follow [place] meeting results  Renal mass - Discussed with Urology. Awaiting further work up before deciding which course of action to take.  - Consulted oncologist who recommended hospice for EOL care  Pulmonary nodules - Could represent metastatic carcinoma.  - PET scan to be obtained after discharge if plan is for further care; if recommendations  and plans are for comfort care no further work up indicated  Malnutrition of moderate degree - Currently not tolerating much due to postoperative ileus -will follow [place] meeting outcome, decisions and rec's -follow dietician rec's   Pt meets criteria for mild/moderate MALNUTRITION in the context of chronic illness as evidenced by >5% weight loss in the past month, <75% intake for > 1 month, and decreased muscle mass.    Code Status: DNR Family Communication: Daughter and husband Disposition Plan: Pending [place] meeting outcome    Consultants:  Urology  CCS  Oncologist  Palliative care  Procedures:  hemicolectomy  Antibiotics:  None  HPI/Subjective: Patient HR/rhythm has remained irregular (with slight non-sustained rate in the 120's); afebrile, WBC's WNL. Still with abd distension and pain. No BM, no flatus. No vomiting and able to keep CLD down.  Objective: Filed Vitals:   07/12/14 1352  BP: 122/70  Pulse: 104  Temp: 98.2 F (36.8 C)  Resp: 18    Intake/Output Summary (Last 24 hours) at 07/12/14 1824 Last data filed at 07/12/14 1700  Gross per 24 hour  Intake   1200 ml  Output      0 ml  Net   1200 ml   Filed Weights   07/04/14 0145  Weight: 47.5 kg (104 lb 11.5 oz)    Exam:   General:  Pt afebrile, complaining of abd pain/distension and no BM or flatus  Cardiovascular: no rubs or gallops, positive soft SEM, irregular rate  Respiratory: scattered rhonchi, otherwise clear.  Abdomen: tender to palpation, no BS appreciated, positive distension  Musculoskeletal: no cyanosis or clubbing   Data Reviewed:  Basic Metabolic Panel:  Recent Labs Lab 07/06/14 0429 07/06/14 1520 07/07/14  8250 07/08/14 0527 07/10/14 1640  NA 138  --  138 132* 130*  K 3.9  --  4.5 4.0 5.3  CL 101  --  103 98 100  CO2 22  --  21 21 18*  GLUCOSE 127*  --  147* 153* 118*  BUN 14  --  20 14 19   CREATININE 0.76 0.79 0.97 0.75 0.91  CALCIUM 10.3  --  9.3 9.6 10.0  MG   --   --   --   --  1.7  PHOS  --   --   --   --  2.3   Liver Function Tests:  Recent Labs Lab 07/06/14 0429  AST 18  ALT 12  ALKPHOS 79  BILITOT 0.8  PROT 7.5  ALBUMIN 3.9   CBC:  Recent Labs Lab 07/05/14 2311 07/06/14 0429 07/06/14 1520 07/07/14 0525 07/08/14 0527 07/11/14 0035 07/12/14 0354  WBC 8.8 9.6 12.1* 15.7* 14.9* 8.4 10.3  NEUTROABS 6.5 6.6  --   --   --   --   --   HGB 10.3* 11.6* 11.3* 9.9* 9.5* 8.0* 9.0*  HCT 31.6* 36.6 35.5* 30.7* 29.3* 24.2* 26.7*  MCV 73.0* 74.5* 74.1* 73.6* 73.3* 73.6* 72.8*  PLT 345 379 365 315 278 287 382    Recent Results (from the past 240 hour(s))  SURGICAL PCR SCREEN     Status: None   Collection Time    07/06/14  9:00 AM      Result Value Ref Range Status   MRSA, PCR NEGATIVE  NEGATIVE Final   Staphylococcus aureus NEGATIVE  NEGATIVE Final   Comment:            The Xpert SA Assay (FDA     approved for NASAL specimens     in patients over 87 years of age),     is one component of     a comprehensive surveillance     program.  Test performance has     been validated by [employer] for patients greater     than or equal to 36 year old.     It is not intended     to diagnose infection nor to     guide or monitor treatment.     Studies: No results found.  Scheduled Meds: . antiseptic oral rinse  7 mL Mouth Rinse BID  . pantoprazole (PROTONIX) IV  40 mg Intravenous Daily   Continuous Infusions: . sodium chloride 75 mL/hr at 07/11/14 0542  . diltiazem (CARDIZEM) infusion 15 mg/hr (07/12/14 1746)  . heparin 1,000 Units/hr (07/12/14 0545)    Time spent: 30 minutes  Barton Dubois  [facility] Pager [phone] If 7PM-7AM, please contact night-coverage at [email or web address], password [facility] 07/12/2014, 6:24 PM  LOS: 9 days

## 2014-07-12 NOTE — Progress Notes (Signed)
Brief Anticoagulation note:  IV Heparin 89 yoM on IV heparin for A-fib.  See full note from 10/22.   Assessement:  Confirmatory HL = 0.31 units/ml  No bleeding or IV problems reported from RN  Plan:  No change   Reheck HL/CBC with am labs  Dorrene German 07/12/2014 11:31 PM

## 2014-07-12 NOTE — Progress Notes (Signed)
Pharmacy Consult Note - Follow Up  See 10/21 progress note from Reuel Boom, PharmD for full details.  Heparin level is subtherapeutic <0.10 on 950 units/hr RN states changed IV site @ ~ 0200, drip only stopped ~ 10 mins.  No bleeding reported per RN  Plan:  Increase heparin to 1000 units/hr  Recheck heparin level ~8 hours   Kirsten Golden, [place]: [phone] 07/12/2014 5:44 AM

## 2014-07-13 ENCOUNTER — Inpatient Hospital Stay (HOSPITAL_COMMUNITY): Payer: Medicaid Other

## 2014-07-13 DIAGNOSIS — R0602 Shortness of breath: Secondary | ICD-10-CM

## 2014-07-13 LAB — CBC
HEMATOCRIT: 25.9 % — AB (ref 36.0–46.0)
HEMOGLOBIN: 8.6 g/dL — AB (ref 12.0–15.0)
MCH: 24.2 pg — AB (ref 26.0–34.0)
MCHC: 33.2 g/dL (ref 30.0–36.0)
MCV: 73 fL — AB (ref 78.0–100.0)
PLATELETS: 348 10*3/uL (ref 150–400)
RBC: 3.55 MIL/uL — ABNORMAL LOW (ref 3.87–5.11)
RDW: 22.6 % — ABNORMAL HIGH (ref 11.5–15.5)
WBC: 9 10*3/uL (ref 4.0–10.5)

## 2014-07-13 LAB — HEPARIN LEVEL (UNFRACTIONATED): Heparin Unfractionated: 0.52 IU/mL (ref 0.30–0.70)

## 2014-07-13 MED ORDER — FUROSEMIDE 10 MG/ML IJ SOLN
20.0000 mg | Freq: Every day | INTRAMUSCULAR | Status: DC
  Administered 2014-07-13 – 2014-07-14 (×2): 20 mg via INTRAVENOUS
  Filled 2014-07-13 (×2): qty 2

## 2014-07-13 MED ORDER — HYDROCORTISONE ACETATE 25 MG RE SUPP
25.0000 mg | Freq: Two times a day (BID) | RECTAL | Status: DC
  Administered 2014-07-13 – 2014-07-16 (×6): 25 mg via RECTAL
  Filled 2014-07-13 (×6): qty 1

## 2014-07-13 MED ORDER — LEVOTHYROXINE SODIUM 100 MCG IV SOLR
25.0000 ug | Freq: Every day | INTRAVENOUS | Status: DC
  Administered 2014-07-13 – 2014-07-14 (×2): 25 ug via INTRAVENOUS
  Filled 2014-07-13 (×3): qty 5

## 2014-07-13 MED ORDER — HYDROCORTISONE 2.5 % RE CREA
TOPICAL_CREAM | Freq: Three times a day (TID) | RECTAL | Status: DC
  Administered 2014-07-13 – 2014-07-16 (×8): via RECTAL
  Filled 2014-07-13 (×2): qty 28.35

## 2014-07-13 NOTE — Progress Notes (Signed)
ANTICOAGULATION CONSULT NOTE - Follow Up Consult  Pharmacy Consult for Heparin Indication: atrial fibrillation  No Known Allergies  Patient Measurements: Height: 4\' 5"  (134.6 cm) Weight: 104 lb 11.5 oz (47.5 kg) IBW/kg (Calculated) : 29.4 Heparin Dosing Weight: 40 kg  Vital Signs: Temp: 97.6 F (36.4 C) (10/23 0557) Temp Source: Oral (10/23 0557) BP: 128/67 mmHg (10/23 0557) Pulse Rate: 88 (10/23 0557)  Labs:  Recent Labs  07/10/14 1640  07/11/14 0035  07/12/14 0354 07/12/14 1410 07/12/14 2156 07/13/14 0417  HGB  --   < > 8.0*  --  9.0*  --   --  8.6*  HCT  --   --  24.2*  --  26.7*  --   --  25.9*  PLT  --   --  287  --  382  --   --  348  HEPARINUNFRC  --   --  0.22*  < > <0.10* 0.31 0.31 0.52  CREATININE 0.91  --   --   --   --   --   --   --   < > = values in this interval not displayed.  Estimated Creatinine Clearance: 24.7 ml/min (by C-G formula based on Cr of 0.91).   Medications:  Infusions:  . sodium chloride 75 mL/hr at 07/12/14 2233  . diltiazem (CARDIZEM) infusion 15 mg/hr (07/13/14 0018)  . heparin 1,000 Units/hr (07/12/14 2233)    Assessment: 54 yoF admitted on 10/13 with anemia and recent colon cancer diagnosis; s/p lap left colectomy on 07/06/14.  Suspected renal cell carcinoma and pulmonary nodules.  She developed Afib with RVR on 10/19 PM and pharmacy was consulted to dose Heparin IV on 10/20.  Today, 10/23  Heparin Level: 0.52, remains therapeutic  CBC: Hgb 8.6 (continues to decrease), Plt 348  Last transfusion was on 10/14  No bleeding reported or documented   Goal of Therapy:  Heparin level 0.3-0.7 units/ml Monitor platelets by anticoagulation protocol: Yes   Plan:   Continue heparin IV infusion at 1000 units/hr  Daily heparin level and CBC  Continue to monitor H&H and platelets  Follow up long-term anticoagulation plan and palliative care consult.     Gretta Arab PharmD, BCPS Pager [phone] 07/13/2014 8:22  AM

## 2014-07-13 NOTE — Progress Notes (Addendum)
[facility] PROGRESS NOTE  Kirsten Golden ESP:[id number] DOB: [birth year] DOA: 07/03/2014 PCP: No primary provider on file. Brief Narrative: 80 y.o. year old female with significant past medical history of colon cancer who presented with anemia. Pt is originally from the middle [place] and speaks only farsi. Per the son, pt was recently diagnosed with rectal/colon cancer. Was seen by [facility] Gastroenterology with recent biopsy. Had CT abd/pelvis and chest. Had Enhancing 4.5 cm left upper renal pole cortical mass, highly suspicious for renal cell carcinoma and pulmonary nodules presumably metastatic disease. Patient was at risk for obstruction. Given narrowing at transverse colon. As such patient is status post hemicolectomy. Oncology consulted for further planning given above history. GOC by palliative care requested. Family wants patient to be DNR.  Assessment/Plan: Atrial fibrillation - Patient currently on Cardizem drip, which will continue for now. - Currently unable to take anything by mouth secondary to post operative ileus  - Mali score of >2 and elevated risk for hypercoagulable state given cancer. Continue heparin  Anemia - pt is s/p transfusion - 2ary to GIB from suspect colon/rectal cancer and blood loss during surgery. Patient is status post lap colectomy - Hemoglobin has remained stable - reassess hgb levels intermittently and transfuse for Hgb < 7.5   GIB (gastrointestinal bleeding) and ABLA - Pt is pod # 7 s/p lap left colectomy 07/06/14 - No further active bleeding appreciated - will follow Hgb intermittently for now -follow [place] meeting results; planned for 10/24 around noon  Renal mass - Discussed with Urology. Awaiting further work up before deciding which course of action to take.  - Consulted oncologist who recommended hospice for EOL care  Pulmonary nodules - Could represent metastatic carcinoma.  - PET scan to be obtained after discharge if plan is for  further care; if recommendations and plans are for comfort care no further work up indicated  Malnutrition of moderate degree -sinc eshe has passed gas this morning, GS recommended advance to full liquid diet -will follow [place] meeting outcome, decisions and rec's -follow dietician rec's   Pt meets criteria for mild/moderate MALNUTRITION in the context of chronic illness as evidenced by >5% weight loss in the past month, <75% intake for > 1 month, and decreased muscle mass.   Insomnia/anxiety: -continue PRN ativan  SOB: presumed to be interstitial edema and diastolic HF -will get CXR -start flutter valve -discontinue IVF's -if x-ray positive for that will give lasix  Hypothyroidism -continue synthroid (IV for now)   Code Status: DNR Family Communication: Daughter and husband Disposition Plan: Pending [place] meeting outcome    Consultants:  Urology  CCS  Oncologist  Palliative care  Procedures:  hemicolectomy  Antibiotics:  None  HPI/Subjective: Patient HR more controlled. Has passed gas this morning. Complaining of some cough and pedal edema. abd pain continues.  Objective: Filed Vitals:   07/13/14 1358  BP: 129/71  Pulse: 96  Temp: 98.1 F (36.7 C)  Resp: 16    Intake/Output Summary (Last 24 hours) at 07/13/14 1545 Last data filed at 07/13/14 1300  Gross per 24 hour  Intake   1605 ml  Output      0 ml  Net   1605 ml   Filed Weights   07/04/14 0145  Weight: 47.5 kg (104 lb 11.5 oz)    Exam:   General:  Pt afebrile, complaining of abd pain/distension; has passed some gas this morning.  and no BM or flatus  Cardiovascular: no rubs or gallops, positive soft SEM,  irregular rate  Respiratory: scattered rhonchi, otherwise clear.  Abdomen: tender to palpation, no BS appreciated, positive distension  Musculoskeletal: no cyanosis or clubbing; pedal edema appreciated  Data Reviewed:  Basic Metabolic Panel:  Recent Labs Lab 07/07/14 0525  07/08/14 0527 07/10/14 1640  NA 138 132* 130*  K 4.5 4.0 5.3  CL 103 98 100  CO2 21 21 18*  GLUCOSE 147* 153* 118*  BUN 20 14 19   CREATININE 0.97 0.75 0.91  CALCIUM 9.3 9.6 10.0  MG  --   --  1.7  PHOS  --   --  2.3   CBC:  Recent Labs Lab 07/07/14 0525 07/08/14 0527 07/11/14 0035 07/12/14 0354 07/13/14 0417  WBC 15.7* 14.9* 8.4 10.3 9.0  HGB 9.9* 9.5* 8.0* 9.0* 8.6*  HCT 30.7* 29.3* 24.2* 26.7* 25.9*  MCV 73.6* 73.3* 73.6* 72.8* 73.0*  PLT 315 278 287 382 348    Recent Results (from the past 240 hour(s))  SURGICAL PCR SCREEN     Status: None   Collection Time    07/06/14  9:00 AM      Result Value Ref Range Status   MRSA, PCR NEGATIVE  NEGATIVE Final   Staphylococcus aureus NEGATIVE  NEGATIVE Final   Comment:            The Xpert SA Assay (FDA     approved for NASAL specimens     in patients over 17 years of age),     is one component of     a comprehensive surveillance     program.  Test performance has     been validated by [employer] for patients greater     than or equal to 51 year old.     It is not intended     to diagnose infection nor to     guide or monitor treatment.     Studies: Dg Chest 2 View  07/13/2014   CLINICAL DATA:  Shortness of breath; history of colonic malignancy with laparoscopic left colectomy earlier this month  EXAM: CHEST  2 VIEW  COMPARISON:  Portable chest x-ray of July 03, 2014  FINDINGS: The lungs are well-expanded. The interstitial markings are increased bilaterally. There are bilateral pleural effusions greater on the left than on the right. There is obscuration of the left hemidiaphragm. The cardiopericardial silhouette is enlarged. The central pulmonary vascularity is mildly prominent. There is dense calcification in the wall of the aortic arch. There is a known large hiatal hernia-partially intra thoracic stomach contributing to the retrocardiac density. The observed bony thorax exhibits no acute abnormality.   IMPRESSION: Congestive heart failure with pulmonary interstitial edema bilateral pleural effusions. One cannot exclude basilar pneumonia. The appearance of the chest has deteriorated since the previous study.   Electronically Signed   By: David  Martinique   On: 07/13/2014 15:03    Scheduled Meds: . antiseptic oral rinse  7 mL Mouth Rinse BID  . levothyroxine  25 mcg Intravenous Daily  . pantoprazole (PROTONIX) IV  40 mg Intravenous Daily   Continuous Infusions: . sodium chloride 75 mL/hr at 07/13/14 1204  . diltiazem (CARDIZEM) infusion 15 mg/hr (07/13/14 1423)  . heparin 1,000 Units/hr (07/12/14 2233)    Time spent: 30 minutes  Barton Dubois  [facility] Pager [phone] If 7PM-7AM, please contact night-coverage at [email or web address], password [facility] 07/13/2014, 3:45 PM  LOS: 10 days

## 2014-07-13 NOTE — Progress Notes (Signed)
Family meeting scheduled for 12 noon on 10/24 per family request. Will address goals of care completely at that time and make recommendations for symptom management.  Lane Hacker, DO Palliative Medicine [phone]

## 2014-07-13 NOTE — Progress Notes (Signed)
Patient ID: Kirsten Golden, female   DOB: 1923-09-29, 80 y.o.   MRN: [id number] [facility] Progress Note:   7 Days Post-Op  Subjective: Mental status is sleeping.  Night before last she was talking out of her head so this period of somnolence is good.   Objective: Vital signs in last 24 hours: Temp:  [97.6 F (36.4 C)-98.2 F (36.8 C)] 97.6 F (36.4 C) (10/23 0557) Pulse Rate:  [80-104] 88 (10/23 0557) Resp:  [16-18] 16 (10/23 0557) BP: (122-128)/(58-70) 128/67 mmHg (10/23 0557) SpO2:  [95 %-97 %] 95 % (10/23 0557)  Intake/Output from previous day: 10/22 0701 - 10/23 0700 In: 2145 [P.O.:1320; I.V.:825] Out: -  Intake/Output this shift:    Physical Exam: Work of breathing is not labored.  Had some audible flatus.    Lab Results:  Results for orders placed during the hospital encounter of 07/03/14 (from the past 48 hour(s))  HEPARIN LEVEL (UNFRACTIONATED)     Status: None   Collection Time    07/11/14 10:06 AM      Result Value Ref Range   Heparin Unfractionated 0.35  0.30 - 0.70 IU/mL   Comment:            IF HEPARIN RESULTS ARE BELOW     EXPECTED VALUES, AND PATIENT     DOSAGE HAS BEEN CONFIRMED,     SUGGEST FOLLOW UP TESTING     OF ANTITHROMBIN III LEVELS.  HEPARIN LEVEL (UNFRACTIONATED)     Status: Abnormal   Collection Time    07/11/14  5:49 PM      Result Value Ref Range   Heparin Unfractionated 0.17 (*) 0.30 - 0.70 IU/mL   Comment:            IF HEPARIN RESULTS ARE BELOW     EXPECTED VALUES, AND PATIENT     DOSAGE HAS BEEN CONFIRMED,     SUGGEST FOLLOW UP TESTING     OF ANTITHROMBIN III LEVELS.  HEPARIN LEVEL (UNFRACTIONATED)     Status: Abnormal   Collection Time    07/12/14  3:54 AM      Result Value Ref Range   Heparin Unfractionated <0.10 (*) 0.30 - 0.70 IU/mL   Comment:            IF HEPARIN RESULTS ARE BELOW     EXPECTED VALUES, AND PATIENT     DOSAGE HAS BEEN CONFIRMED,     SUGGEST FOLLOW UP TESTING     OF ANTITHROMBIN III  LEVELS.  CBC     Status: Abnormal   Collection Time    07/12/14  3:54 AM      Result Value Ref Range   WBC 10.3  4.0 - 10.5 K/uL   RBC 3.67 (*) 3.87 - 5.11 MIL/uL   Hemoglobin 9.0 (*) 12.0 - 15.0 g/dL   HCT 26.7 (*) 36.0 - 46.0 %   MCV 72.8 (*) 78.0 - 100.0 fL   MCH 24.5 (*) 26.0 - 34.0 pg   MCHC 33.7  30.0 - 36.0 g/dL   RDW 22.5 (*) 11.5 - 15.5 %   Platelets 382  150 - 400 K/uL   Comment: DELTA CHECK NOTED     REPEATED TO VERIFY  HEPARIN LEVEL (UNFRACTIONATED)     Status: None   Collection Time    07/12/14  2:10 PM      Result Value Ref Range   Heparin Unfractionated 0.31  0.30 - 0.70 IU/mL   Comment:  IF HEPARIN RESULTS ARE BELOW     EXPECTED VALUES, AND PATIENT     DOSAGE HAS BEEN CONFIRMED,     SUGGEST FOLLOW UP TESTING     OF ANTITHROMBIN III LEVELS.  HEPARIN LEVEL (UNFRACTIONATED)     Status: None   Collection Time    07/12/14  9:56 PM      Result Value Ref Range   Heparin Unfractionated 0.31  0.30 - 0.70 IU/mL   Comment:            IF HEPARIN RESULTS ARE BELOW     EXPECTED VALUES, AND PATIENT     DOSAGE HAS BEEN CONFIRMED,     SUGGEST FOLLOW UP TESTING     OF ANTITHROMBIN III LEVELS.  CBC     Status: Abnormal   Collection Time    07/13/14  4:17 AM      Result Value Ref Range   WBC 9.0  4.0 - 10.5 K/uL   RBC 3.55 (*) 3.87 - 5.11 MIL/uL   Hemoglobin 8.6 (*) 12.0 - 15.0 g/dL   HCT 25.9 (*) 36.0 - 46.0 %   MCV 73.0 (*) 78.0 - 100.0 fL   MCH 24.2 (*) 26.0 - 34.0 pg   MCHC 33.2  30.0 - 36.0 g/dL   RDW 22.6 (*) 11.5 - 15.5 %   Platelets 348  150 - 400 K/uL  HEPARIN LEVEL (UNFRACTIONATED)     Status: None   Collection Time    07/13/14  4:17 AM      Result Value Ref Range   Heparin Unfractionated 0.52  0.30 - 0.70 IU/mL   Comment:            IF HEPARIN RESULTS ARE BELOW     EXPECTED VALUES, AND PATIENT     DOSAGE HAS BEEN CONFIRMED,     SUGGEST FOLLOW UP TESTING     OF ANTITHROMBIN III LEVELS.    Radiology/Results: No results  found.  Anti-infectives: Anti-infectives   Start     Dose/Rate Route Frequency Ordered Stop   07/06/14 0600  cefOXitin (MEFOXIN) 2 g in dextrose 5 % 50 mL IVPB     2 g 100 mL/hr over 30 Minutes Intravenous On call to O.R. 07/05/14 1548 07/06/14 1030      Assessment/Plan: Problem List: Patient Active Problem List   Diagnosis Date Noted  . Lap assisted left colectomy Oct 2015 07/06/2014  . Malnutrition of moderate degree 07/05/2014  . Renal mass 07/04/2014  . Anemia 07/03/2014  . GIB (gastrointestinal bleeding) 07/03/2014  . Malignancy 07/03/2014    Begin clear liquids PO.   7 Days Post-Op    LOS: 10 days   Matt B. Hassell Done, MD, [facility] [phone] beeper [phone]  07/13/2014 8:46 AM

## 2014-07-14 DIAGNOSIS — R609 Edema, unspecified: Secondary | ICD-10-CM

## 2014-07-14 LAB — CBC
HCT: 25.1 % — ABNORMAL LOW (ref 36.0–46.0)
HEMOGLOBIN: 8.2 g/dL — AB (ref 12.0–15.0)
MCH: 23.9 pg — ABNORMAL LOW (ref 26.0–34.0)
MCHC: 32.7 g/dL (ref 30.0–36.0)
MCV: 73.2 fL — AB (ref 78.0–100.0)
Platelets: 386 10*3/uL (ref 150–400)
RBC: 3.43 MIL/uL — AB (ref 3.87–5.11)
RDW: 23 % — ABNORMAL HIGH (ref 11.5–15.5)
WBC: 8.3 10*3/uL (ref 4.0–10.5)

## 2014-07-14 LAB — HEPARIN LEVEL (UNFRACTIONATED): HEPARIN UNFRACTIONATED: 0.54 [IU]/mL (ref 0.30–0.70)

## 2014-07-14 MED ORDER — SENNOSIDES-DOCUSATE SODIUM 8.6-50 MG PO TABS
1.0000 | ORAL_TABLET | Freq: Two times a day (BID) | ORAL | Status: DC
  Administered 2014-07-14 – 2014-07-16 (×5): 1 via ORAL
  Filled 2014-07-14 (×5): qty 1

## 2014-07-14 MED ORDER — SODIUM CHLORIDE 0.9 % IJ SOLN
3.0000 mL | Freq: Two times a day (BID) | INTRAMUSCULAR | Status: DC
  Administered 2014-07-14 – 2014-07-16 (×5): 3 mL via INTRAVENOUS

## 2014-07-14 MED ORDER — MORPHINE SULFATE (CONCENTRATE) 10 MG /0.5 ML PO SOLN: 5.0000 mg | ORAL | Status: DC | PRN

## 2014-07-14 MED ORDER — SODIUM CHLORIDE 0.9 % IV SOLN: 250.0000 mL | INTRAVENOUS | Status: DC | PRN

## 2014-07-14 MED ORDER — ACETAMINOPHEN 325 MG PO TABS
650.0000 mg | ORAL_TABLET | Freq: Four times a day (QID) | ORAL | Status: DC
  Administered 2014-07-14 – 2014-07-16 (×8): 650 mg via ORAL
  Filled 2014-07-14 (×8): qty 2

## 2014-07-14 MED ORDER — DILTIAZEM HCL ER COATED BEADS 120 MG PO CP24
240.0000 mg | ORAL_CAPSULE | Freq: Every day | ORAL | Status: DC
  Administered 2014-07-14 – 2014-07-15 (×2): 240 mg via ORAL
  Filled 2014-07-14 (×2): qty 2

## 2014-07-14 MED ORDER — FUROSEMIDE 20 MG PO TABS
20.0000 mg | ORAL_TABLET | Freq: Every day | ORAL | Status: DC
  Administered 2014-07-14: 20 mg via ORAL

## 2014-07-14 MED ORDER — ASPIRIN EC 81 MG PO TBEC
81.0000 mg | DELAYED_RELEASE_TABLET | Freq: Every day | ORAL | Status: DC
  Administered 2014-07-14 – 2014-07-16 (×3): 81 mg via ORAL
  Filled 2014-07-14 (×3): qty 1

## 2014-07-14 MED ORDER — FUROSEMIDE 10 MG/ML IJ SOLN
20.0000 mg | Freq: Every day | INTRAMUSCULAR | Status: AC
  Administered 2014-07-14 – 2014-07-16 (×3): 20 mg via INTRAVENOUS
  Filled 2014-07-14 (×3): qty 2

## 2014-07-14 MED ORDER — PANTOPRAZOLE SODIUM 40 MG PO TBEC
40.0000 mg | DELAYED_RELEASE_TABLET | Freq: Every day | ORAL | Status: DC
  Administered 2014-07-14 – 2014-07-16 (×3): 40 mg via ORAL
  Filled 2014-07-14 (×3): qty 1

## 2014-07-14 MED ORDER — LEVOTHYROXINE SODIUM 50 MCG PO TABS
50.0000 ug | ORAL_TABLET | Freq: Every day | ORAL | Status: DC
  Administered 2014-07-15 – 2014-07-16 (×2): 50 ug via ORAL
  Filled 2014-07-14 (×2): qty 1

## 2014-07-14 MED ORDER — SODIUM CHLORIDE 0.9 % IJ SOLN
3.0000 mL | INTRAMUSCULAR | Status: DC | PRN
  Administered 2014-07-14: 3 mL via INTRAVENOUS

## 2014-07-14 NOTE — Progress Notes (Signed)
[facility] PROGRESS NOTE  Kirsten Golden QAS:[id number] DOB: 04/02/1924 DOA: 07/03/2014 PCP: No primary provider on file. Brief Narrative: 80 y.o. year old female with significant past medical history of colon cancer who presented with anemia. Pt is originally from the middle [place] and speaks only farsi. Per the son, pt was recently diagnosed with rectal/colon cancer. Was seen by [facility] with recent biopsy. Had CT abd/pelvis and chest. Had Enhancing 4.5 cm left upper renal pole cortical mass, highly suspicious for renal cell carcinoma and pulmonary nodules presumably metastatic disease. Patient was at risk for obstruction. Given narrowing at transverse colon. As such patient is status post hemicolectomy. Oncology consulted for further planning given above history. GOC by palliative care requested. Family wants patient to be DNR and plan is for her to go home with hospice.  Assessment/Plan: Atrial fibrillation - Patient has past BM/gas and tolerated full liquid diet; plan is to advance to heart healthy diet and transition meds to PO -will start cardizem 240 mg daily -given recent bleeding and decision for hospice/comfort care  Will use ASA  Anemia - pt is s/p transfusion; 2ary to GIB from suspect colon/rectal cancer and blood loss during surgery. Patient is status post lap colectomy and no further active bleeding appreciated - Hemoglobin has remained stable at 8.2 (10/24) -following comfort care measurements, plan is not to check any further blood work   GIB (gastrointestinal bleeding) and ABLA - Pt is pod # 8 s/p lap left colectomy 07/06/14 - No further active bleeding appreciated - last Hgb 8.2 (10/24) -plan is for hospice/comfort and no further blood work -follow [place] meeting results; planned for 10/24 around noon  Renal mass - Discussed with Urology. Awaiting further work up before deciding which course of action to take.  - Consulted oncologist who  recommended hospice for EOL care  Pulmonary nodules - Could represent metastatic carcinoma.  - PET scan to be obtained after discharge if plan is for further care; if recommendations and plans are for comfort care no further work up indicated  Malnutrition of moderate degree -sinc eshe has passed gas and BM this morning, diet will be advance to heart healthy -will follow [place] meeting outcome, decisions and rec's -follow dietician rec's   Pt meets criteria for mild/moderate MALNUTRITION in the context of chronic illness as evidenced by >5% weight loss in the past month, <75% intake for > 1 month, and decreased muscle mass.   Insomnia/anxiety: -continue PRN ativan  SOB: 2/2 interstitial edema and diastolic HF from A. fib -CXR demonstrated interstitial edema and vascular congestion -continue flutter valve -IVF's changed to NSL -continue lasix -symptoms improved with use of diuretics  Hypothyroidism -continue synthroid   Code Status: DNR Family Communication: Daughter and husband Disposition Plan: Pending [place] meeting outcome    Consultants:  Urology  CCS  Oncologist  Palliative care  Procedures:  hemicolectomy  Antibiotics:  None  HPI/Subjective: Patient HR more controlled. Has passed gas and BM this morning. Reports improvement in cough and SOB.  Objective: Filed Vitals:   07/14/14 1448  BP: 129/54  Pulse: 108  Temp: 98 F (36.7 C)  Resp: 16    Intake/Output Summary (Last 24 hours) at 07/14/14 1509 Last data filed at 07/13/14 1700  Gross per 24 hour  Intake    990 ml  Output      0 ml  Net    990 ml   Filed Weights   07/04/14 0145  Weight: 47.5 kg (104 lb 11.5 oz)  Exam:   General:  Pt afebrile, complaining of abd pain/distension; has BM this morning. Improve in cough and mild SOB  Cardiovascular: no rubs or gallops, positive soft SEM, irregular rate  Respiratory: scattered rhonchi, otherwise clear.  Abdomen: tender to palpation,  decreased but +BS, mild distension  Musculoskeletal: no cyanosis or clubbing; pedal edema appreciated bilaterally  Data Reviewed:  Basic Metabolic Panel:  Recent Labs Lab 07/08/14 0527 07/10/14 1640  NA 132* 130*  K 4.0 5.3  CL 98 100  CO2 21 18*  GLUCOSE 153* 118*  BUN 14 19  CREATININE 0.75 0.91  CALCIUM 9.6 10.0  MG  --  1.7  PHOS  --  2.3   CBC:  Recent Labs Lab 07/08/14 0527 07/11/14 0035 07/12/14 0354 07/13/14 0417 07/14/14 0516  WBC 14.9* 8.4 10.3 9.0 8.3  HGB 9.5* 8.0* 9.0* 8.6* 8.2*  HCT 29.3* 24.2* 26.7* 25.9* 25.1*  MCV 73.3* 73.6* 72.8* 73.0* 73.2*  PLT 278 287 382 348 386    Recent Results (from the past 240 hour(s))  SURGICAL PCR SCREEN     Status: None   Collection Time    07/06/14  9:00 AM      Result Value Ref Range Status   MRSA, PCR NEGATIVE  NEGATIVE Final   Staphylococcus aureus NEGATIVE  NEGATIVE Final   Comment:            The Xpert SA Assay (FDA     approved for NASAL specimens     in patients over 48 years of age),     is one component of     a comprehensive surveillance     program.  Test performance has     been validated by [employer] for patients greater     than or equal to 55 year old.     It is not intended     to diagnose infection nor to     guide or monitor treatment.     Studies: Dg Chest 2 View  07/13/2014   CLINICAL DATA:  Shortness of breath; history of colonic malignancy with laparoscopic left colectomy earlier this month  EXAM: CHEST  2 VIEW  COMPARISON:  Portable chest x-ray of July 03, 2014  FINDINGS: The lungs are well-expanded. The interstitial markings are increased bilaterally. There are bilateral pleural effusions greater on the left than on the right. There is obscuration of the left hemidiaphragm. The cardiopericardial silhouette is enlarged. The central pulmonary vascularity is mildly prominent. There is dense calcification in the wall of the aortic arch. There is a known large hiatal  hernia-partially intra thoracic stomach contributing to the retrocardiac density. The observed bony thorax exhibits no acute abnormality.  IMPRESSION: Congestive heart failure with pulmonary interstitial edema bilateral pleural effusions. One cannot exclude basilar pneumonia. The appearance of the chest has deteriorated since the previous study.   Electronically Signed   By: David  Martinique   On: 07/13/2014 15:03    Scheduled Meds: . acetaminophen  650 mg Oral QID  . antiseptic oral rinse  7 mL Mouth Rinse BID  . aspirin EC  81 mg Oral Daily  . diltiazem  240 mg Oral Daily  . furosemide  20 mg Intravenous Daily  . hydrocortisone   Rectal TID  . hydrocortisone  25 mg Rectal BID  . [START ON 07/15/2014] levothyroxine  50 mcg Oral QAC breakfast  . pantoprazole  40 mg Oral Q1200  . senna-docusate  1 tablet Oral BID  .  sodium chloride  3 mL Intravenous Q12H   Continuous Infusions:    Time spent: 30 minutes  Barton Dubois  [facility] Pager [phone] If 7PM-7AM, please contact night-coverage at [email or web address], password [facility] 07/14/2014, 3:09 PM  LOS: 11 days

## 2014-07-14 NOTE — Progress Notes (Signed)
ANTICOAGULATION CONSULT NOTE - Follow Up Consult  Pharmacy Consult for Heparin Indication: atrial fibrillation  Assessment: 22 yoF admitted on 10/13 with anemia and recent colon cancer diagnosis; s/p lap left colectomy on 07/06/14.  Suspected renal cell carcinoma and pulmonary nodules.  She developed Afib with RVR on 10/19 PM and pharmacy was consulted to dose Heparin IV on 10/20.  Today, 10/24  Heparin Level therapeutic and stable  Hgb continuing to trend down.  Patient with GIB from new colon cancer and blood loss from Sgx, but no active bleeding noted currently.  Orders to [facility] for Hgb < 7.5 (Last transfusion was on 10/14)  Plt wnl   Goal of Therapy:  Heparin level 0.3-0.7 units/ml Monitor platelets by anticoagulation protocol: Yes   Plan:   Continue heparin IV infusion at 1000 units/hr  Daily heparin level and CBC  Continue to monitor H&H and platelets  Follow up long-term anticoagulation plan and palliative care consult.    ------------------------------------------------------------------------  No Known Allergies  Patient Measurements: Height: 4\' 5"  (134.6 cm) Weight: 104 lb 11.5 oz (47.5 kg) IBW/kg (Calculated) : 29.4 Heparin Dosing Weight: 40 kg  Vital Signs: Temp: 97.9 F (36.6 C) (10/24 0600) Temp Source: Oral (10/24 0600) BP: 118/70 mmHg (10/24 0600) Pulse Rate: 91 (10/24 0600)  Labs:  Recent Labs  07/12/14 0354  07/12/14 2156 07/13/14 0417 07/14/14 0516  HGB 9.0*  --   --  8.6* 8.2*  HCT 26.7*  --   --  25.9* 25.1*  PLT 382  --   --  348 386  HEPARINUNFRC <0.10*  < > 0.31 0.52 0.54  < > = values in this interval not displayed.  Estimated Creatinine Clearance: 24.7 ml/min (by C-G formula based on Cr of 0.91).   Medications:  Infusions:  . sodium chloride 20 mL/hr at 07/13/14 2209  . diltiazem (CARDIZEM) infusion 15 mg/hr (07/14/14 0339)  . heparin 1,000 Units/hr (07/13/14 2209)    Reuel Boom, PharmD Pager:  [phone] 07/14/2014, 9:29 AM

## 2014-07-15 MED ORDER — VITAMINS A & D EX OINT
TOPICAL_OINTMENT | CUTANEOUS | Status: AC
  Administered 2014-07-15: 12:00:00
  Filled 2014-07-15: qty 5

## 2014-07-15 MED ORDER — VITAMINS A & D EX OINT
TOPICAL_OINTMENT | CUTANEOUS | Status: AC
  Filled 2014-07-15: qty 5

## 2014-07-15 MED ORDER — DILTIAZEM HCL ER COATED BEADS 180 MG PO CP24
300.0000 mg | ORAL_CAPSULE | Freq: Every day | ORAL | Status: DC
  Administered 2014-07-16: 300 mg via ORAL
  Filled 2014-07-15 (×2): qty 1

## 2014-07-15 NOTE — Progress Notes (Signed)
[facility] PROGRESS NOTE  Jaskiran Pata QMG:[id number] DOB: 21-Oct-1923 DOA: 07/03/2014 PCP: No primary provider on file. Brief Narrative: 80 y.o. year old female with significant past medical history of colon cancer who presented with anemia. Pt is originally from the middle [place] and speaks only farsi. Per the son, pt was recently diagnosed with rectal/colon cancer. Was seen by [facility] with recent biopsy. Had CT abd/pelvis and chest. Had Enhancing 4.5 cm left upper renal pole cortical mass, highly suspicious for renal cell carcinoma and pulmonary nodules presumably metastatic disease. Patient was at risk for obstruction. Given narrowing at transverse colon. As such patient is status post hemicolectomy. Oncology consulted for further planning given above history. GOC by palliative care requested. Family wants patient to be DNR and plan is for her to go home with hospice.  Assessment/Plan: Atrial fibrillation - Patient has past BM/gas and tolerated full liquid diet; plan is to advance to heart healthy diet and transition meds to PO -will increase cardizem to 300 mg daily -given recent bleeding and decision for hospice/comfort care, Will use ASA and avoid anticoagulation  Anemia - pt is s/p transfusion; 2ary to GIB from suspect colon/rectal cancer and blood loss during surgery. Patient is status post lap colectomy and no further active bleeding appreciated - Hemoglobin has remained stable at 8.2 (10/24) -following comfort care measurements, plan is not to check any further blood work   GIB (gastrointestinal bleeding) and ABLA - Pt is pod # 9 s/p lap left colectomy 07/06/14 - No further active bleeding appreciated - last Hgb 8.2 (10/24) -plan is for hospice/comfort and no further blood work -follow [place] meeting results; planned for 10/24 around noon  Renal mass - Discussed with Urology. Awaiting further work up before deciding which course of action to take.  -  Consulted oncologist who recommended hospice for EOL care  Pulmonary nodules - Could represent metastatic carcinoma.  - PET scan to be obtained after discharge if plan is for further care; if recommendations and plans are for comfort care no further work up indicated  Malnutrition of moderate degree -sinc eshe has passed gas and BM this morning, diet will be advance to heart healthy -will follow [place] meeting outcome, decisions and rec's -follow dietician rec's   Pt meets criteria for mild/moderate MALNUTRITION in the context of chronic illness as evidenced by >5% weight loss in the past month, <75% intake for > 1 month, and decreased muscle mass.   Insomnia/anxiety: -continue PRN ativan  SOB: 2/2 interstitial edema and diastolic HF from A. fib -CXR demonstrated interstitial edema and vascular congestion -continue flutter valve -IVF's changed to NSL -continue lasix -symptoms improved with use of diuretics; will continue lasix X1 more day  Hypothyroidism -continue synthroid   Code Status: DNR Family Communication: Daughter and husband Disposition Plan: home with hospice   Consultants:  Urology  CCS  Oncologist  Palliative care  Procedures:  hemicolectomy  Antibiotics:  None  HPI/Subjective:  Pt afebrile, tolerating regular diet; good BM's. Minimal abd discomfort. Endorses significant improvement of her breathing  Objective: Filed Vitals:   07/15/14 1404  BP: 113/89  Pulse: 181  Temp: 98.1 F (36.7 C)  Resp: 16    Intake/Output Summary (Last 24 hours) at 07/15/14 1751 Last data filed at 07/14/14 1800  Gross per 24 hour  Intake    120 ml  Output      0 ml  Net    120 ml   Filed Weights   07/04/14 0145  Weight:  47.5 kg (104 lb 11.5 oz)    Exam:   General:  Pt afebrile, tolerating regular diet; good BM's. Minimal abd discomfort. Endorses significant improvement of her breathing  Cardiovascular: no rubs or gallops, positive soft SEM, irregular  rate  Respiratory: scattered rhonchi, otherwise clear.  Abdomen: tender to palpation, decreased but +BS, mild distension  Musculoskeletal: no cyanosis or clubbing; pedal edema appreciated bilaterally  Data Reviewed:  Basic Metabolic Panel:  Recent Labs Lab 07/10/14 1640  NA 130*  K 5.3  CL 100  CO2 18*  GLUCOSE 118*  BUN 19  CREATININE 0.91  CALCIUM 10.0  MG 1.7  PHOS 2.3   CBC:  Recent Labs Lab 07/11/14 0035 07/12/14 0354 07/13/14 0417 07/14/14 0516  WBC 8.4 10.3 9.0 8.3  HGB 8.0* 9.0* 8.6* 8.2*  HCT 24.2* 26.7* 25.9* 25.1*  MCV 73.6* 72.8* 73.0* 73.2*  PLT 287 382 348 386    Recent Results (from the past 240 hour(s))  SURGICAL PCR SCREEN     Status: None   Collection Time    07/06/14  9:00 AM      Result Value Ref Range Status   MRSA, PCR NEGATIVE  NEGATIVE Final   Staphylococcus aureus NEGATIVE  NEGATIVE Final   Comment:            The Xpert SA Assay (FDA     approved for NASAL specimens     in patients over 23 years of age),     is one component of     a comprehensive surveillance     program.  Test performance has     been validated by [employer] for patients greater     than or equal to 70 year old.     It is not intended     to diagnose infection nor to     guide or monitor treatment.     Studies: No results found.  Scheduled Meds: . acetaminophen  650 mg Oral QID  . antiseptic oral rinse  7 mL Mouth Rinse BID  . aspirin EC  81 mg Oral Daily  . [START ON 07/16/2014] diltiazem  300 mg Oral Daily  . furosemide  20 mg Intravenous Daily  . hydrocortisone   Rectal TID  . hydrocortisone  25 mg Rectal BID  . levothyroxine  50 mcg Oral QAC breakfast  . pantoprazole  40 mg Oral Q1200  . senna-docusate  1 tablet Oral BID  . sodium chloride  3 mL Intravenous Q12H   Continuous Infusions:    Time spent: 30 minutes  Barton Dubois  [facility] Pager [phone] If 7PM-7AM, please contact night-coverage at [email or web address], password  [facility] 07/15/2014, 5:51 PM  LOS: 12 days

## 2014-07-15 NOTE — Consult Note (Signed)
Palliative Medicine Team at [facility]  Date: 07/15/2014   Patient Name: Kirsten Golden  DOB: 01/01/1924  MRN: [id number]  Age / Sex: 80 y.o., female   PCP: No primary provider on file. Referring Physician: Barton Dubois, MD  Active Problems: Active Problems:   Anemia   GIB (gastrointestinal bleeding)   Malignancy   Renal mass   Malnutrition of moderate degree   Lap assisted left colectomy Oct 2015   HPI/Reason for Consultation: Lesko is a 80 y.o. female from [place] who was admitted for laparoscopic colon resection for newly diagnosed Colon Ca, subsequently found to have a renal mass and possible pulmonary metastasis. Post-op delirium,illeus, pain and volume overload but steadily improving and OOB. Had BM last PM. PMT consulted for hospice options. Oncology has recommended EOL care/hospice. No further invasive w/u desired by family.  Participants in Discussion: HCPOA: yes   Advance Directive:    Code Status Orders        Start     Ordered   07/11/14 1322  Do not attempt resuscitation (DNR)   Continuous    Question Answer Comment  In the event of cardiac or respiratory ARREST Do not call a "code blue"   In the event of cardiac or respiratory ARREST Do not perform Intubation, CPR, defibrillation or ACLS   In the event of cardiac or respiratory ARREST Use medication by any route, position, wound care, and other measures to relive pain and suffering. May use oxygen, suction and manual treatment of airway obstruction as needed for comfort.      07/11/14 1321      I have reviewed the medical record, interviewed the patient and family, and examined the patient. The following aspects are pertinent.  Past Medical History  Diagnosis Date  . Colon cancer    History   Social History  . Marital Status: Widowed    Spouse Name: N/A    Number of Children: N/A  . Years of Education: N/A   Social History Main Topics  . Smoking status: Never Smoker   .  Smokeless tobacco: None  . Alcohol Use: No  . Drug Use: None  . Sexual Activity: None   Other Topics Concern  . None   Social History Narrative  . None   History reviewed. No pertinent family history. Scheduled Meds: . acetaminophen  650 mg Oral QID  . antiseptic oral rinse  7 mL Mouth Rinse BID  . aspirin EC  81 mg Oral Daily  . diltiazem  240 mg Oral Daily  . furosemide  20 mg Intravenous Daily  . hydrocortisone   Rectal TID  . hydrocortisone  25 mg Rectal BID  . levothyroxine  50 mcg Oral QAC breakfast  . pantoprazole  40 mg Oral Q1200  . senna-docusate  1 tablet Oral BID  . sodium chloride  3 mL Intravenous Q12H   Continuous Infusions:  PRN Meds:.sodium chloride, feeding supplement (RESOURCE BREEZE), LORazepam, morphine CONCENTRATE, ondansetron (ZOFRAN) IV, ondansetron, sodium chloride No Known Allergies CBC:    Component Value Date/Time   WBC 8.3 07/14/2014 0516   HGB 8.2* 07/14/2014 0516   HCT 25.1* 07/14/2014 0516   PLT 386 07/14/2014 0516   MCV 73.2* 07/14/2014 0516   NEUTROABS 6.6 07/06/2014 0429   LYMPHSABS 1.8 07/06/2014 0429   MONOABS 0.9 07/06/2014 0429   EOSABS 0.2 07/06/2014 0429   BASOSABS 0.1 07/06/2014 0429   Comprehensive Metabolic Panel:    Component Value Date/Time   NA 130* 07/10/2014 1640  K 5.3 07/10/2014 1640   CL 100 07/10/2014 1640   CO2 18* 07/10/2014 1640   BUN 19 07/10/2014 1640   CREATININE 0.91 07/10/2014 1640   GLUCOSE 118* 07/10/2014 1640   CALCIUM 10.0 07/10/2014 1640   AST 18 07/06/2014 0429   ALT 12 07/06/2014 0429   ALKPHOS 79 07/06/2014 0429   BILITOT 0.8 07/06/2014 0429   PROT 7.5 07/06/2014 0429   ALBUMIN 3.9 07/06/2014 0429    Vital Signs: BP 105/78  Pulse 121  Temp(Src) 98.2 F (36.8 C) (Oral)  Resp 16  Ht 4\' 5"  (1.346 m)  Wt 47.5 kg (104 lb 11.5 oz)  BMI 26.22 kg/m2  SpO2 96% Filed Weights   07/04/14 0145  Weight: 47.5 kg (104 lb 11.5 oz)   10/24 0701 - 10/25 0700 In: 120 [I.V.:120] Out: -    Physical Exam:  General Appearance: Frail, grimace OOB HEENT:normal Lungs:decreased BS CV: irregularly irregular rhythm Abdomen:Post surgical wounds healing well, tender to palp Extremities:normal Skin:Skin color, texture, turgor normal. No rashes or lesions Psych: slightly cionfused   Neuro: normal  Summary of Established Goals of Care and Medical Treatment Preferences  Primary Diagnoses  1. Colon cancer with metastatic disease vs. Second primary tumor/Rebal Cell Cancer 2. Delirium 3. CHF, volume overload Active Symptoms: 1. Abdominal pain 2. Upper back muscle spasm 3. Hemorrhoid pain 4. Agitation  Psycho-social/Spiritual:  Family at bedside, very close and loving/supportive, patient speaks [place]. Patient is also [place] and could not understand/hear interpreter phone. Prognosis: <6 months   Palliative Performance Scale: 40%  Recommendations:  1. Code Status: DNR 2. Scope of Treatment:   Comfort, QOL, dignity are primary goals  No anticoagulation, ASA   Switch to oral diltiazem  Diurese with Lasix   No additional invasive procedures  Complete full course of antibiotics  Control pain with Tylenol scheduled and Roxanol PRN  Senna-S qhs bowel regimen 3. Symptom Management:   Home ASAP to minimize risk for delirium  Roxanol SL for pain 4. Palliative Prophylaxis:   Bowel regimen  Low dose SL ativan for sleep/agitation prn 5. Disposition:   Home with Hospice (patients daughter is a Insurance underwriter with [employer])   Time In: 12 Time Out: 1:10PM Time Total: 70 minutes Greater than 50%  of this time was spent counseling and coordinating care related to the above assessment and plan.  Signed by: Roma Schanz, DO  07/15/2014, 9:21 AM  Please contact Palliative Medicine Team phone at [phone] for questions and concerns.

## 2014-07-15 NOTE — Progress Notes (Signed)
9 Days Post-Op  Subjective: Pt tol PO OOB  Objective: Vital signs in last 24 hours: Temp:  [98 F (36.7 C)-98.2 F (36.8 C)] 98.2 F (36.8 C) (10/25 0431) Pulse Rate:  [108-123] 121 (10/25 0431) Resp:  [16-18] 16 (10/25 0431) BP: (105-137)/(54-95) 105/78 mmHg (10/25 0431) SpO2:  [96 %-100 %] 96 % (10/25 0431) Last BM Date: 07/14/14  Intake/Output from previous day: 10/24 0701 - 10/25 0700 In: 120 [I.V.:120] Out: -  Intake/Output this shift:    General appearance: alert and cooperative Resp: clear to auscultation bilaterally Cardio: regular rate and rhythm, S1, S2 normal, no murmur, click, rub or gallop GI: soft, nttp, nd, wound c/d/i  Lab Results:   Recent Labs  07/13/14 0417 07/14/14 0516  WBC 9.0 8.3  HGB 8.6* 8.2*  HCT 25.9* 25.1*  PLT 348 386   BMET No results found for this basename: NA, K, CL, CO2, GLUCOSE, BUN, CREATININE, CALCIUM,  in the last 72 hours PT/INR No results found for this basename: LABPROT, INR,  in the last 72 hours ABG No results found for this basename: PHART, PCO2, PO2, HCO3,  in the last 72 hours  Studies/Results: Dg Chest 2 View  07/13/2014   CLINICAL DATA:  Shortness of breath; history of colonic malignancy with laparoscopic left colectomy earlier this month  EXAM: CHEST  2 VIEW  COMPARISON:  Portable chest x-ray of July 03, 2014  FINDINGS: The lungs are well-expanded. The interstitial markings are increased bilaterally. There are bilateral pleural effusions greater on the left than on the right. There is obscuration of the left hemidiaphragm. The cardiopericardial silhouette is enlarged. The central pulmonary vascularity is mildly prominent. There is dense calcification in the wall of the aortic arch. There is a known large hiatal hernia-partially intra thoracic stomach contributing to the retrocardiac density. The observed bony thorax exhibits no acute abnormality.  IMPRESSION: Congestive heart failure with pulmonary interstitial  edema bilateral pleural effusions. One cannot exclude basilar pneumonia. The appearance of the chest has deteriorated since the previous study.   Electronically Signed   By: David  Martinique   On: 07/13/2014 15:03    Anti-infectives: Anti-infectives   Start     Dose/Rate Route Frequency Ordered Stop   07/06/14 0600  cefOXitin (MEFOXIN) 2 g in dextrose 5 % 50 mL IVPB     2 g 100 mL/hr over 30 Minutes Intravenous On call to O.R. 07/05/14 1548 07/06/14 1030      Assessment/Plan: s/p Procedure(s): LAPAROSCOPIC ASSISTED TRANSVERSE COLECTOMY (N/A) Con't PO OOBTC  LOS: 12 days    Rosario Jacks., Denea Cheaney 07/15/2014

## 2014-07-16 MED ORDER — HYDROCODONE-ACETAMINOPHEN 7.5-325 MG/15ML PO SOLN: 15.0000 mL | Freq: Four times a day (QID) | ORAL | Status: DC | PRN

## 2014-07-16 MED ORDER — ASPIRIN 81 MG PO TBEC: 81.0000 mg | DELAYED_RELEASE_TABLET | Freq: Every day | ORAL | Status: AC

## 2014-07-16 MED ORDER — SENNOSIDES-DOCUSATE SODIUM 8.6-50 MG PO TABS: 1.0000 | ORAL_TABLET | Freq: Two times a day (BID) | ORAL | Status: DC

## 2014-07-16 MED ORDER — ACETAMINOPHEN 500 MG PO TABS: 500.0000 mg | ORAL_TABLET | Freq: Four times a day (QID) | ORAL | Status: AC

## 2014-07-16 MED ORDER — PANTOPRAZOLE SODIUM 40 MG PO TBEC: 40.0000 mg | DELAYED_RELEASE_TABLET | Freq: Every day | ORAL | Status: AC

## 2014-07-16 MED ORDER — HYDROCORTISONE 2.5 % RE CREA: TOPICAL_CREAM | Freq: Three times a day (TID) | RECTAL | Status: AC

## 2014-07-16 MED ORDER — FUROSEMIDE 20 MG PO TABS: 20.0000 mg | ORAL_TABLET | Freq: Every day | ORAL | Status: DC

## 2014-07-16 MED ORDER — METOPROLOL SUCCINATE ER 25 MG PO TB24: 25.0000 mg | ORAL_TABLET | Freq: Every day | ORAL | Status: DC

## 2014-07-16 MED ORDER — DILTIAZEM HCL ER COATED BEADS 300 MG PO CP24: 300.0000 mg | ORAL_CAPSULE | Freq: Every day | ORAL | Status: DC

## 2014-07-16 MED ORDER — HYDROCORTISONE ACETATE 25 MG RE SUPP: 25.0000 mg | Freq: Two times a day (BID) | RECTAL | Status: DC

## 2014-07-16 MED ORDER — LORAZEPAM 0.5 MG PO TABS: 0.5000 mg | ORAL_TABLET | Freq: Every evening | ORAL | Status: DC | PRN

## 2014-07-16 NOTE — Discharge Summary (Signed)
Physician Discharge Summary  Kirsten Golden JSH:[id number] DOB: September 23, 1923 DOA: 07/03/2014  PCP: No primary provider on file.  Admit date: 07/03/2014 Discharge date: 07/16/2014  Time spent: >30 minutes  Recommendations for Outpatient Follow-up:  Comfort care Patient has been discharged home with hospice  Discharge Diagnoses:  Anemia GIB (gastrointestinal bleeding) Colon cancer with presumed metastasis  Renal mass Malnutrition of moderate degree Atrial fibrillation Hemorrhoids (external) Hypothyroidism Insomnia/anxiety   Discharge Condition: stable and improved. No chest pain, no shortness of breath. Patient tolerating diet and with good bowel movements on daily basis.  Diet recommendation: low sodium diet/comfort feeding  Filed Weights   07/04/14 0145  Weight: 47.5 kg (104 lb 11.5 oz)    History of present illness:  80 y.o. year old female with significant past medical history of colon cancer who presented with anemia. Pt is originally from the middle [place] and speaks only farsi. Per the son, pt was recently diagnosed with rectal/colon cancer. Was seen by [facility] with recent biopsy. Had CT abd/pelvis and chest. Had Enhancing 4.5 cm left upper renal pole cortical mass, highly suspicious for renal cell carcinoma and pulmonary nodules presumably metastatic disease. Patient was at risk for complete colonic obstruction and presented to PCP office with active GI bleed. Admitted for hemicolectomy   Hospital Course:  Atrial fibrillation  - will discharge on cardizem 300 mg daily and metoprolol 25mg  daily -given recent bleeding and decision for hospice/comfort care, Will use ASA and avoid anticoagulation   Anemia  - pt is s/p transfusion during this admission; 2/2 GIB from suspect colon/rectal cancer and blood loss during surgery. -Patient is status post lap colectomy and no further active bleeding appreciated  - Hemoglobin has remained stable at 8.2 (10/24)   -following results from goals of care meeting and decision for comfort measures only, will not check any further blood work   GIB (gastrointestinal bleeding) and ABLA  - Pt is pod # 10 s/p lap left colectomy 07/06/14  - No further active bleeding appreciated  - last Hgb 8.2 (10/24)  -plan is for hospice/comfort and no further blood work will be done  Renal mass and metastatic colon cancer - Discussed with Urology service; and at this particular moment following goals of care meeting results and oncologist recommendations have decided not to pursue any further treatment or workup. - Consulted oncologist who recommended hospice for EOL care   Pulmonary nodules  - Could represent metastatic carcinoma.  - Given recommendations and plan of care being comfort care, no further work up indicated   Malnutrition of moderate degree  -diet advanced to heart healthy  And so far well tolerated Pt meets criteria for mild/moderate MALNUTRITION in the context of chronic illness as evidenced by >5% weight loss in the past month, <75% intake for > 1 month, and decreased muscle mass.    Insomnia/anxiety:  -continue PRN ativan   SOB: 2/2 interstitial edema and diastolic HF from A. fib  -CXR demonstrated interstitial edema and vascular congestion on 10/24 -continue use of flutter valve  -continue lasix PO -symptoms improved/resolved with use of diuretics and at discharge patient denies SOB   Hypothyroidism  -continue synthroid  Essential HTN -will treat with metoprolol, cardizem and low dose lasix  External hemorrhoids: Will use anusol suppository and cream -Started on stool softener  [place] meeting and palliative care rec's 1. Code Status: DNR 2. Scope of Treatment:  Comfort, QOL, dignity are primary goals  No anticoagulation, ASA  Switch to oral diltiazem  Diurese  with Lasix  No additional invasive procedures  Complete full course of antibiotics  Control pain with Tylenol scheduled and  Roxanol PRN  Senna-S qhs bowel regimen 3. Symptom Management:  Home ASAP to minimize risk for delirium  Roxanol SL for pain 4. Palliative Prophylaxis:  Bowel regimen  Low dose SL ativan for sleep/agitation prn 5. Disposition:  Home with Hospice   Procedures: Hemicolectomy  Consultations: Urology  CCS  Oncologist  Palliative care   Discharge Exam: Filed Vitals:   07/16/14 0955  BP: 125/74  Pulse:   Temp:   Resp:    General: Pt afebrile, tolerating regular diet; good BM's. Minimal abd discomfort. Endorses significant improvement of her breathing  Cardiovascular: no rubs or gallops, positive soft SEM, irregular rate  Respiratory: scattered rhonchi, otherwise clear.  Abdomen: minimal tenderness on palpation, soft, positive BS; clean incision; staples to be removed today (10/26). No drainage  Musculoskeletal: no cyanosis or clubbing; pedal edema appreciated bilaterally   Discharge Instructions You were cared for by a hospitalist during your hospital stay. If you have any questions about your discharge medications or the care you received while you were in the hospital after you are discharged, you can call the unit and asked to speak with the hospitalist on call if the hospitalist that took care of you is not available. Once you are discharged, your primary care physician will handle any further medical issues. Please note that NO REFILLS for any discharge medications will be authorized once you are discharged, as it is imperative that you return to your primary care physician (or establish a relationship with a primary care physician if you do not have one) for your aftercare needs so that they can reassess your need for medications and monitor your lab values.  Discharge Instructions   Diet - low sodium heart healthy    Complete by:  As directed      Discharge instructions    Complete by:  As directed   Take medications as prescribed Follow instructions and recommendations  from Hospice care Comfort feeding (just try to maintain low sodium intake)          Current Discharge Medication List    START taking these medications   Details  acetaminophen (TYLENOL) 500 MG tablet Take 1 tablet (500 mg total) by mouth 4 (four) times daily.    aspirin EC 81 MG EC tablet Take 1 tablet (81 mg total) by mouth daily.    diltiazem (CARDIZEM CD) 300 MG 24 hr capsule Take 1 capsule (300 mg total) by mouth daily. Qty: 30 capsule, Refills: 1    furosemide (LASIX) 20 MG tablet Take 1 tablet (20 mg total) by mouth daily. Qty: 30 tablet, Refills: 1    HYDROcodone-acetaminophen (HYCET) 7.5-325 mg/15 ml solution Take 15 mLs by mouth every 6 (six) hours as needed for moderate pain. Qty: 45 mL, Refills: 0    hydrocortisone (ANUSOL-HC) 2.5 % rectal cream Place rectally 3 (three) times daily. Qty: 30 g, Refills: 0    hydrocortisone (ANUSOL-HC) 25 MG suppository Place 1 suppository (25 mg total) rectally 2 (two) times daily. Qty: 12 suppository, Refills: 0    LORazepam (ATIVAN) 0.5 MG tablet Take 1 tablet (0.5 mg total) by mouth at bedtime as needed for sleep. Qty: 30 tablet, Refills: 0    metoprolol succinate (TOPROL XL) 25 MG 24 hr tablet Take 1 tablet (25 mg total) by mouth daily. Take with or immediately following a meal. Qty: 30 tablet, Refills: 1  pantoprazole (PROTONIX) 40 MG tablet Take 1 tablet (40 mg total) by mouth daily. Qty: 30 tablet, Refills: 1    senna-docusate (SENOKOT-S) 8.6-50 MG per tablet Take 1 tablet by mouth 2 (two) times daily. Qty: 60 tablet, Refills: 1      CONTINUE these medications which have NOT CHANGED   Details  diclofenac sodium (VOLTAREN) 1 % GEL Apply 2 g topically 4 (four) times daily.    ferrous fumarate (HEMOCYTE - 106 MG FE) 325 (106 FE) MG TABS tablet Take 1 tablet by mouth.    levothyroxine (SYNTHROID, LEVOTHROID) 50 MCG tablet Take 50 mcg by mouth daily before breakfast.    triamterene-hydrochlorothiazide (DYAZIDE) 50-25  MG per capsule Take 1 capsule by mouth daily.      STOP taking these medications     alendronate (FOSAMAX) 70 MG tablet      amLODipine (NORVASC) 5 MG tablet      atorvastatin (LIPITOR) 10 MG tablet      cinacalcet (SENSIPAR) 30 MG tablet      NITROGLYCERIN PO      Vitamin D, Ergocalciferol, (DRISDOL) 50000 UNITS CAPS capsule        No Known Allergies Follow-up Information   Follow up with MARTIN,MATTHEW B, MD In 4 weeks.   Specialty:  General Surgery   Contact information:   [address] [place] [address] [phone]        The results of significant diagnostics from this hospitalization (including imaging, microbiology, ancillary and laboratory) are listed below for reference.    Significant Diagnostic Studies: Dg Chest 2 View  07/13/2014   CLINICAL DATA:  Shortness of breath; history of colonic malignancy with laparoscopic left colectomy earlier this month  EXAM: CHEST  2 VIEW  COMPARISON:  Portable chest x-ray of July 03, 2014  FINDINGS: The lungs are well-expanded. The interstitial markings are increased bilaterally. There are bilateral pleural effusions greater on the left than on the right. There is obscuration of the left hemidiaphragm. The cardiopericardial silhouette is enlarged. The central pulmonary vascularity is mildly prominent. There is dense calcification in the wall of the aortic arch. There is a known large hiatal hernia-partially intra thoracic stomach contributing to the retrocardiac density. The observed bony thorax exhibits no acute abnormality.  IMPRESSION: Congestive heart failure with pulmonary interstitial edema bilateral pleural effusions. One cannot exclude basilar pneumonia. The appearance of the chest has deteriorated since the previous study.   Electronically Signed   By: David  Martinique   On: 07/13/2014 15:03   Ct Chest W Contrast  06/22/2014   CLINICAL DATA:  Generalized abdominal pain  EXAM: CT CHEST, ABDOMEN, AND PELVIS  WITH CONTRAST  TECHNIQUE: Multidetector CT imaging of the chest, abdomen and pelvis was performed following the standard protocol during bolus administration of intravenous contrast.  CONTRAST:  168mL OMNIPAQUE IOHEXOL 300 MG/ML  SOLN  BUN and creatinine were obtained on site at [facility] at  [address]  Results:  BUN 21 mg/dL,  Creatinine 1.0 mg/dL.  COMPARISON:  No similar prior exam is available at this institution for comparison or on [facility] PACS. Chest/abdomen radiographs 06/12/2014 are reviewed.  FINDINGS: CT CHEST FINDINGS  Thyroid is inhomogeneous. Ascending aortic ectasia is identified measuring 3.9 x 3.6 cm at the level of the main pulmonary artery bifurcation image 22. Descending thoracic aorta is tortuous and ectatic. Large hiatal hernia containing stomach is noted. Severe atheromatous aortic calcification and coronary arterial calcification noted. Heart size is moderately enlarged.  No lymphadenopathy. Fluid in the superior pericardial recess is noted. Mild biapical pleural thickening is noted. 5 mm left upper lobe pulmonary parenchymal nodule image 15. 1.6 cm right lower lobe pulmonary nodule identified image 36. No pleural effusion.  CT ABDOMEN AND PELVIS FINDINGS  Lower chest:  See above dedicated report  Hepatobiliary: Sub cm hypodense probable hepatic cysts or biliary hamartomas noted. 1.5 cm dominant caudate cyst incidentally noted. Gallbladder is normal. Mild intrahepatic ductal dilatation and mild fusiform prominence of the common duct with tapering to the ampulla noted.  Pancreas: Normal  Spleen: Normal  Adrenals/Urinary Tract: There is an enhancing inhomogeneous 4.5 x 4.2 cm left upper renal pole cortical mass image 50. A fat plane is present between this mass and adjacent psoas muscle and perinephric fat. Bilateral multiple too small to characterize renal cortical hypodense lesions are identified. Some hypodense renal cortical masses cannot be confidently characterized as  cysts, including dominant 6 cm right lower renal pole cyst and 2.0 cm left mid renal cortical cyst image 52. No hydroureteronephrosis. No renal vein filling defects to suggest tumor thrombus.  Stomach/Bowel: Fecal impaction is present within the rectum. No bowel wall thickening or focal segmental dilatation. Normal appendix. Moderate volume of stool elsewhere throughout the colon. There is short segmental descending colonic wall thickening with mild surrounding stranding image 59, without surrounding fluid collection identified.  Vascular/Lymphatic: No lymphadenopathy. Severe atheromatous aortic calcification without aneurysm.  Reproductive: Uterus and ovaries are unremarkable.  Bladder is normal in appearance.  Other: Presacral soft tissue stranding is identified which may indicate cellulitis or decubitus ulcer, correlate clinically.  Musculoskeletal: Subjectively osteopenic, with right lower thoracic curvature centered at T7. Evidence of L2 posterior decompression. Sclerotic lesion within the right lamina of T9 image 31. Possible healing fracture right lateral fifth rib image 32.  IMPRESSION: Enhancing 4.5 cm left upper renal pole cortical mass, highly suspicious for renal cell carcinoma. The presence of bilateral pulmonary nodules as described above raises the question of metastatic disease. Consider PET-CT for further evaluation.  Short segmental descending colonic wall thickening for which primary differential considerations include adenocarcinoma or focal colitis or diverticulitis without complicating feature. Consider colonoscopy for further evaluation.  Indeterminate sclerotic lesion involving the right lamina of T9. Metastatic disease or bone island could appear similar and could be further evaluated at PET-CT.  Healing right lateral fifth rib fracture.  These results will be called to the ordering clinician or representative by the Radiologist Assistant, and communication documented in the PACS or zVision  Dashboard.   Electronically Signed   By: Conchita Paris M.D.   On: 06/22/2014 12:29   Ct Abdomen Pelvis W Contrast  06/22/2014   CLINICAL DATA:  Generalized abdominal pain  EXAM: CT CHEST, ABDOMEN, AND PELVIS WITH CONTRAST  TECHNIQUE: Multidetector CT imaging of the chest, abdomen and pelvis was performed following the standard protocol during bolus administration of intravenous contrast.  CONTRAST:  135mL OMNIPAQUE IOHEXOL 300 MG/ML  SOLN  BUN and creatinine were obtained on site at [place] at  [address]  Results:  BUN 21 mg/dL,  Creatinine 1.0 mg/dL.  COMPARISON:  No similar prior exam is available at this institution for comparison or on [facility] PACS. Chest/abdomen radiographs 06/12/2014 are reviewed.  FINDINGS: CT CHEST FINDINGS  Thyroid is inhomogeneous. Ascending aortic ectasia is identified measuring 3.9 x 3.6 cm at the level of the main pulmonary artery bifurcation image 22. Descending thoracic aorta is tortuous and ectatic. Large hiatal hernia containing stomach is  noted. Severe atheromatous aortic calcification and coronary arterial calcification noted. Heart size is moderately enlarged. No lymphadenopathy. Fluid in the superior pericardial recess is noted. Mild biapical pleural thickening is noted. 5 mm left upper lobe pulmonary parenchymal nodule image 15. 1.6 cm right lower lobe pulmonary nodule identified image 36. No pleural effusion.  CT ABDOMEN AND PELVIS FINDINGS  Lower chest:  See above dedicated report  Hepatobiliary: Sub cm hypodense probable hepatic cysts or biliary hamartomas noted. 1.5 cm dominant caudate cyst incidentally noted. Gallbladder is normal. Mild intrahepatic ductal dilatation and mild fusiform prominence of the common duct with tapering to the ampulla noted.  Pancreas: Normal  Spleen: Normal  Adrenals/Urinary Tract: There is an enhancing inhomogeneous 4.5 x 4.2 cm left upper renal pole cortical mass image 50. A fat plane is present between this mass and  adjacent psoas muscle and perinephric fat. Bilateral multiple too small to characterize renal cortical hypodense lesions are identified. Some hypodense renal cortical masses cannot be confidently characterized as cysts, including dominant 6 cm right lower renal pole cyst and 2.0 cm left mid renal cortical cyst image 52. No hydroureteronephrosis. No renal vein filling defects to suggest tumor thrombus.  Stomach/Bowel: Fecal impaction is present within the rectum. No bowel wall thickening or focal segmental dilatation. Normal appendix. Moderate volume of stool elsewhere throughout the colon. There is short segmental descending colonic wall thickening with mild surrounding stranding image 59, without surrounding fluid collection identified.  Vascular/Lymphatic: No lymphadenopathy. Severe atheromatous aortic calcification without aneurysm.  Reproductive: Uterus and ovaries are unremarkable.  Bladder is normal in appearance.  Other: Presacral soft tissue stranding is identified which may indicate cellulitis or decubitus ulcer, correlate clinically.  Musculoskeletal: Subjectively osteopenic, with right lower thoracic curvature centered at T7. Evidence of L2 posterior decompression. Sclerotic lesion within the right lamina of T9 image 31. Possible healing fracture right lateral fifth rib image 32.  IMPRESSION: Enhancing 4.5 cm left upper renal pole cortical mass, highly suspicious for renal cell carcinoma. The presence of bilateral pulmonary nodules as described above raises the question of metastatic disease. Consider PET-CT for further evaluation.  Short segmental descending colonic wall thickening for which primary differential considerations include adenocarcinoma or focal colitis or diverticulitis without complicating feature. Consider colonoscopy for further evaluation.  Indeterminate sclerotic lesion involving the right lamina of T9. Metastatic disease or bone island could appear similar and could be further  evaluated at PET-CT.  Healing right lateral fifth rib fracture.  These results will be called to the ordering clinician or representative by the Radiologist Assistant, and communication documented in the PACS or zVision Dashboard.   Electronically Signed   By: Conchita Paris M.D.   On: 06/22/2014 12:29   Dg Chest Port 1 View  07/03/2014   CLINICAL DATA:  Anemia, colon cancer, scheduled for colostomy next week. Possible renal cancer.  EXAM: PORTABLE CHEST - 1 VIEW  COMPARISON:  CT chest dated 06/22/2014.  FINDINGS: Chronic interstitial markings. Mild bibasilar atelectasis. No focal consolidation. No pleural effusion or pneumothorax.  The heart is top-normal in size.  Moderate hiatal hernia.  IMPRESSION: No evidence of acute cardiopulmonary disease.  Moderate hiatal hernia.   Electronically Signed   By: Julian Hy M.D.   On: 07/03/2014 19:18   Labs: Basic Metabolic Panel:  Recent Labs Lab 07/10/14 1640  NA 130*  K 5.3  CL 100  CO2 18*  GLUCOSE 118*  BUN 19  CREATININE 0.91  CALCIUM 10.0  MG 1.7  PHOS 2.3  CBC:  Recent Labs Lab 07/11/14 0035 07/12/14 0354 07/13/14 0417 07/14/14 0516  WBC 8.4 10.3 9.0 8.3  HGB 8.0* 9.0* 8.6* 8.2*  HCT 24.2* 26.7* 25.9* 25.1*  MCV 73.6* 72.8* 73.0* 73.2*  PLT 287 [phone]    Signed:  Barton Dubois  [facility] 07/16/2014, 2:19 PM

## 2014-07-16 NOTE — Progress Notes (Signed)
All Staples removed per Dr. Hassell Done. Setzer, Marchelle Folks

## 2014-07-16 NOTE — Discharge Instructions (Signed)
May shower and walk ad lib. Diet as tolerated

## 2014-07-16 NOTE — Progress Notes (Signed)
Notified by [facility], patient and family request services of [facility]) after discharge.  Pt seen at bedside she is eating lunch, appears in good spirits, daughter stated pt does have some abdominal pain with movement, husband at bedside; neither hsuband nor patient speak [place] Daughter is Optometrist. Spoke with pt's daughter, Lindley Magnus to initiate education related to hospice services, philosophy and team approach to care, she voiced good understanding of information provided. Per discussion plan is to d/c by personal vehicle to home Daughter voiced she has spoken with the [facility] representative and they will be delivering DME between 1-5 and she would like to take pt home and wait on delivery to come she did not feel the need to have DME in the home prior to discharge-she stated they have a comfortable place for her mother to rest  DME requested Complete Pkg A: fully electric hospital bed with AP&P mattress, half rails(top/bottom) over-bed table, 3n1 BSC, light weight wheelchair, walker with wheels and shower chair with back;Jewel Lonia Mad has coordinated with [place] to place order  Initial paperwork faxed to [place] Please notify [facility] when patient is ready to leave unit at d/c call [phone] (or [phone] if after 5 pm);  [facility] information and contact numbers also given to daughter Mahia  during visit.   Above information shared with [facility] Please call with any questions or concerns   Danton Sewer, RN NSN [facility] 07/16/2014, 1:30 PM [facility] [phone]

## 2014-07-19 ENCOUNTER — Other Ambulatory Visit (INDEPENDENT_AMBULATORY_CARE_PROVIDER_SITE_OTHER): Payer: Medicaid Other

## 2014-07-19 ENCOUNTER — Other Ambulatory Visit (HOSPITAL_COMMUNITY): Payer: Self-pay | Admitting: *Deleted

## 2014-07-19 ENCOUNTER — Other Ambulatory Visit: Payer: Self-pay

## 2014-07-19 ENCOUNTER — Other Ambulatory Visit: Payer: Medicaid Other

## 2014-07-19 DIAGNOSIS — R0600 Dyspnea, unspecified: Secondary | ICD-10-CM

## 2014-07-19 DIAGNOSIS — R579 Shock, unspecified: Secondary | ICD-10-CM

## 2014-07-20 ENCOUNTER — Other Ambulatory Visit: Payer: Medicaid Other

## 2014-07-20 ENCOUNTER — Encounter: Payer: Self-pay | Admitting: Cardiovascular Disease

## 2014-07-20 ENCOUNTER — Ambulatory Visit (INDEPENDENT_AMBULATORY_CARE_PROVIDER_SITE_OTHER): Payer: Medicaid Other | Admitting: Cardiovascular Disease

## 2014-07-20 ENCOUNTER — Telehealth: Payer: Self-pay

## 2014-07-20 ENCOUNTER — Telehealth: Payer: Self-pay | Admitting: Cardiovascular Disease

## 2014-07-20 ENCOUNTER — Ambulatory Visit: Payer: Medicaid Other | Admitting: Cardiovascular Disease

## 2014-07-20 ENCOUNTER — Encounter: Payer: Self-pay | Admitting: *Deleted

## 2014-07-20 VITALS — BP 130/80 | HR 125 | Ht <= 58 in | Wt 110.8 lb

## 2014-07-20 DIAGNOSIS — I4891 Unspecified atrial fibrillation: Secondary | ICD-10-CM

## 2014-07-20 DIAGNOSIS — C801 Malignant (primary) neoplasm, unspecified: Secondary | ICD-10-CM

## 2014-07-20 MED ORDER — METOPROLOL TARTRATE 100 MG PO TABS: 100.0000 mg | ORAL_TABLET | Freq: Two times a day (BID) | ORAL | Status: AC

## 2014-07-20 NOTE — Assessment & Plan Note (Signed)
The patient likely has chronic atrial fibrillation. I recommend rate control. She is not a candidate for anticoagulation due to anemia and colon cancer. I suspect that the anemia is somewhat contributing to rapid ventricular response. I think a beta blocker would be a better option than a calcium channel blocker which might be contributing to her constipation. I discontinued diltiazem and Toprol. I started metoprolol tartrate 100 mg twice daily. Follow-up in 2 weeks to recheck. Continue small dose aspirin for now.

## 2014-07-20 NOTE — Telephone Encounter (Signed)
LVM 10/30

## 2014-07-20 NOTE — Telephone Encounter (Signed)
Hospice nurse would like note from today's visit faxed to her [phone]

## 2014-07-20 NOTE — Telephone Encounter (Signed)
Note faxed as requested

## 2014-07-20 NOTE — Telephone Encounter (Signed)
Daughter called, please call pt. She was returning the cal.

## 2014-07-20 NOTE — Assessment & Plan Note (Signed)
I agree with hospice care. Long-term prognosis appears poor with short life expectancy.

## 2014-07-20 NOTE — Telephone Encounter (Signed)
Pt daughter is calling letting us know that pt on the way home wasn't feeling well, and that she had to give pt Metoprolol another 100 mg, she is just making sure this was okay.

## 2014-07-20 NOTE — Telephone Encounter (Signed)
Per Dr. Fletcher Anon it is all right that she took one tablet today  She can start tomorrow with BID

## 2014-07-20 NOTE — Progress Notes (Signed)
Primary care physician: [name] Interpreter during this visit: [name]  HPI  This is an 80 y.o. year old female who was referred for evaluation of atrial fibrillation. She was recently hospitalized at [facility]. She has  significant past medical history of colon cancer. Pt is originally from [place] and speaks only farsi.She was recently diagnosed with rectal/colon cancer. Was seen by [facility] with recent biopsy. Had CT abd/pelvis and chest. Had Enhancing 4.5 cm left upper renal pole cortical mass, highly suspicious for renal cell carcinoma and pulmonary nodules presumably metastatic disease. Patient was at risk for complete colonic obstruction and underwent hemicolectomy on October 16. She had postoperative atrial fibrillation treated with diltiazem and metoprolol for rate control. She had anemia due to GI loss and was not a candidate for anticoagulation. She underwent transfusion. The patient was seen by palliative care and basically is hospice/comfort care. She was referred for rate control of atrial fibrillation. She has been having constipation. She is currently on diltiazem and small dose metoprolol. She underwent an echocardiogram yesterday in our office which showed normal LV systolic function, moderate to severe tricuspid regurgitation with no significant pulmonary hypertension. The patient denies chest pain. She does have dyspnea with difficulty sleeping at night. It appears also that she is having periods of confusion.    No Known Allergies   Current Outpatient Prescriptions on File Prior to Visit  Medication Sig Dispense Refill  . acetaminophen (TYLENOL) 500 MG tablet Take 1 tablet (500 mg total) by mouth 4 (four) times daily.      Marland Kitchen aspirin EC 81 MG EC tablet Take 1 tablet (81 mg total) by mouth daily.      . diclofenac sodium (VOLTAREN) 1 % GEL Apply 2 g topically 4 (four) times daily.      . ferrous fumarate (HEMOCYTE - 106 MG FE) 325 (106 FE) MG TABS tablet  Take 1 tablet by mouth.      . furosemide (LASIX) 20 MG tablet Take 1 tablet (20 mg total) by mouth daily.  30 tablet  1  . HYDROcodone-acetaminophen (HYCET) 7.5-325 mg/15 ml solution Take 15 mLs by mouth every 6 (six) hours as needed for moderate pain.  45 mL  0  . hydrocortisone (ANUSOL-HC) 2.5 % rectal cream Place rectally 3 (three) times daily.  30 g  0  . hydrocortisone (ANUSOL-HC) 25 MG suppository Place 1 suppository (25 mg total) rectally 2 (two) times daily.  12 suppository  0  . levothyroxine (SYNTHROID, LEVOTHROID) 50 MCG tablet Take 50 mcg by mouth daily before breakfast.      . LORazepam (ATIVAN) 0.5 MG tablet Take 1 tablet (0.5 mg total) by mouth at bedtime as needed for sleep.  30 tablet  0  . pantoprazole (PROTONIX) 40 MG tablet Take 1 tablet (40 mg total) by mouth daily.  30 tablet  1  . senna-docusate (SENOKOT-S) 8.6-50 MG per tablet Take 1 tablet by mouth 2 (two) times daily.  60 tablet  1  . triamterene-hydrochlorothiazide (DYAZIDE) 50-25 MG per capsule Take 1 capsule by mouth daily.       No current facility-administered medications on file prior to visit.     Past Medical History  Diagnosis Date  . Colon cancer   . Allergy   . Constipation   . Frequent urination   . Hypothyroidism   . Vitamin D deficiency   . Hyperparathyroidism   . Hypertension   . Pure hypercholesterolemia   . Osteoporosis   . Chronic  renal insufficiency   . Atrial fibrillation   . CHF (congestive heart failure)   . Lung nodule      Past Surgical History  Procedure Laterality Date  . Laparoscopic partial colectomy N/A 07/06/2014    Procedure: LAPAROSCOPIC ASSISTED TRANSVERSE COLECTOMY;  Surgeon: Pedro Earls, MD;  Location: [facility];  Service: General;  Laterality: N/A;  . Tumor surgery       Family History  Problem Relation Age of Onset  . Family history unknown: Yes     History   Social History  . Marital Status: Widowed    Spouse Name: N/A    Number of Children: N/A    . Years of Education: N/A   Occupational History  . Not on file.   Social History Main Topics  . Smoking status: Never Smoker   . Smokeless tobacco: Not on file  . Alcohol Use: No  . Drug Use: No  . Sexual Activity: Not on file   Other Topics Concern  . Not on file   Social History Narrative  . No narrative on file     ROS A 10 point review of system was performed. It is negative other than that mentioned in the history of present illness.   PHYSICAL EXAM   BP 130/80  Pulse 125  Ht 4\' 9"  (1.448 m)  Wt 110 lb 12 oz (50.236 kg)  BMI 23.96 kg/m2 Constitutional: She is oriented to person, place, and time. She appears frail. No distress.  HENT: No nasal discharge.  Head: Normocephalic and atraumatic.  Eyes: Pupils are equal and round. No discharge.  Neck: Normal range of motion. Neck supple. No JVD present. No thyromegaly present.  Cardiovascular: Tachycardic, irregular rhythm, normal heart sounds. Exam reveals no gallop and no friction rub. No murmur heard.  Pulmonary/Chest: Effort normal and breath sounds normal. No stridor. No respiratory distress. She has no wheezes. She has no rales. She exhibits no tenderness.  Abdominal: Soft. Bowel sounds are normal. She exhibits no distension. There is no tenderness. There is no rebound and no guarding.  Musculoskeletal: Normal range of motion. She exhibits no edema and no tenderness.  Neurological: She is alert and oriented to person, place, and time. Coordination normal.  Skin: Skin is warm and dry. No rash noted. She is not diaphoretic. No erythema. No pallor.  Psychiatric: She has a normal mood and affect. Her behavior is normal. Judgment and thought content normal.     EKG: Atrial flutter-fibrillation  Low voltage -possible pulmonary disease.   ABNORMAL    ASSESSMENT AND PLAN

## 2014-07-20 NOTE — Patient Instructions (Addendum)
The heart echo showed normal heart function. We need to control the heart rate.   Your physician has recommended you make the following change in your medication:  Stop Diltiazem  Stop Toprol  Start Metoprolol Tartrate 100 mg twice daily    Your physician recommends that you schedule a follow-up appointment in:  2 weeks   Your next appointment will be scheduled in our new office located at :  [facility]  [address], [place]  [place], [place] [address]

## 2014-07-24 ENCOUNTER — Ambulatory Visit: Payer: Medicaid Other | Admitting: Cardiovascular Disease

## 2014-07-25 ENCOUNTER — Emergency Department (HOSPITAL_COMMUNITY)

## 2014-07-25 ENCOUNTER — Inpatient Hospital Stay (HOSPITAL_COMMUNITY)
Admission: EM | Admit: 2014-07-25 | Discharge: 2014-08-01 | DRG: 308 | Disposition: A | Discharge status: Hospice | Attending: Internal Medicine | Admitting: Internal Medicine

## 2014-07-25 ENCOUNTER — Encounter (HOSPITAL_COMMUNITY): Payer: Self-pay

## 2014-07-25 DIAGNOSIS — D638 Anemia in other chronic diseases classified elsewhere: Secondary | ICD-10-CM | POA: Diagnosis present

## 2014-07-25 DIAGNOSIS — E785 Hyperlipidemia, unspecified: Secondary | ICD-10-CM | POA: Diagnosis present

## 2014-07-25 DIAGNOSIS — I5031 Acute diastolic (congestive) heart failure: Secondary | ICD-10-CM | POA: Diagnosis present

## 2014-07-25 DIAGNOSIS — Z79899 Other long term (current) drug therapy: Secondary | ICD-10-CM | POA: Diagnosis not present

## 2014-07-25 DIAGNOSIS — Z515 Encounter for palliative care: Secondary | ICD-10-CM | POA: Diagnosis not present

## 2014-07-25 DIAGNOSIS — E039 Hypothyroidism, unspecified: Secondary | ICD-10-CM | POA: Diagnosis present

## 2014-07-25 DIAGNOSIS — D62 Acute posthemorrhagic anemia: Secondary | ICD-10-CM | POA: Diagnosis present

## 2014-07-25 DIAGNOSIS — C189 Malignant neoplasm of colon, unspecified: Secondary | ICD-10-CM | POA: Diagnosis present

## 2014-07-25 DIAGNOSIS — D72829 Elevated white blood cell count, unspecified: Secondary | ICD-10-CM | POA: Diagnosis present

## 2014-07-25 DIAGNOSIS — D649 Anemia, unspecified: Secondary | ICD-10-CM | POA: Diagnosis present

## 2014-07-25 DIAGNOSIS — E871 Hypo-osmolality and hyponatremia: Secondary | ICD-10-CM | POA: Diagnosis present

## 2014-07-25 DIAGNOSIS — J90 Pleural effusion, not elsewhere classified: Secondary | ICD-10-CM | POA: Diagnosis present

## 2014-07-25 DIAGNOSIS — Z6821 Body mass index (BMI) 21.0-21.9, adult: Secondary | ICD-10-CM

## 2014-07-25 DIAGNOSIS — Z7982 Long term (current) use of aspirin: Secondary | ICD-10-CM

## 2014-07-25 DIAGNOSIS — R0602 Shortness of breath: Secondary | ICD-10-CM

## 2014-07-25 DIAGNOSIS — G9340 Encephalopathy, unspecified: Secondary | ICD-10-CM | POA: Diagnosis present

## 2014-07-25 DIAGNOSIS — D500 Iron deficiency anemia secondary to blood loss (chronic): Secondary | ICD-10-CM | POA: Diagnosis present

## 2014-07-25 DIAGNOSIS — I083 Combined rheumatic disorders of mitral, aortic and tricuspid valves: Secondary | ICD-10-CM | POA: Diagnosis present

## 2014-07-25 DIAGNOSIS — N390 Urinary tract infection, site not specified: Secondary | ICD-10-CM | POA: Diagnosis present

## 2014-07-25 DIAGNOSIS — C649 Malignant neoplasm of unspecified kidney, except renal pelvis: Secondary | ICD-10-CM | POA: Diagnosis present

## 2014-07-25 DIAGNOSIS — K922 Gastrointestinal hemorrhage, unspecified: Secondary | ICD-10-CM | POA: Diagnosis present

## 2014-07-25 DIAGNOSIS — C7800 Secondary malignant neoplasm of unspecified lung: Secondary | ICD-10-CM | POA: Diagnosis present

## 2014-07-25 DIAGNOSIS — E876 Hypokalemia: Secondary | ICD-10-CM | POA: Diagnosis not present

## 2014-07-25 DIAGNOSIS — T501X5A Adverse effect of loop [high-ceiling] diuretics, initial encounter: Secondary | ICD-10-CM | POA: Diagnosis present

## 2014-07-25 DIAGNOSIS — I4891 Unspecified atrial fibrillation: Secondary | ICD-10-CM | POA: Diagnosis present

## 2014-07-25 DIAGNOSIS — Z9049 Acquired absence of other specified parts of digestive tract: Secondary | ICD-10-CM | POA: Diagnosis present

## 2014-07-25 DIAGNOSIS — Z66 Do not resuscitate: Secondary | ICD-10-CM | POA: Diagnosis present

## 2014-07-25 DIAGNOSIS — G9341 Metabolic encephalopathy: Secondary | ICD-10-CM | POA: Diagnosis present

## 2014-07-25 DIAGNOSIS — Z79891 Long term (current) use of opiate analgesic: Secondary | ICD-10-CM

## 2014-07-25 DIAGNOSIS — E43 Unspecified severe protein-calorie malnutrition: Secondary | ICD-10-CM | POA: Diagnosis present

## 2014-07-25 DIAGNOSIS — R911 Solitary pulmonary nodule: Secondary | ICD-10-CM | POA: Diagnosis present

## 2014-07-25 DIAGNOSIS — I509 Heart failure, unspecified: Secondary | ICD-10-CM | POA: Insufficient documentation

## 2014-07-25 DIAGNOSIS — K219 Gastro-esophageal reflux disease without esophagitis: Secondary | ICD-10-CM | POA: Diagnosis present

## 2014-07-25 DIAGNOSIS — Z792 Long term (current) use of antibiotics: Secondary | ICD-10-CM

## 2014-07-25 DIAGNOSIS — R140 Abdominal distension (gaseous): Secondary | ICD-10-CM | POA: Diagnosis present

## 2014-07-25 DIAGNOSIS — I5030 Unspecified diastolic (congestive) heart failure: Secondary | ICD-10-CM

## 2014-07-25 DIAGNOSIS — I10 Essential (primary) hypertension: Secondary | ICD-10-CM | POA: Diagnosis present

## 2014-07-25 HISTORY — DX: Acute diastolic (congestive) heart failure: I50.31

## 2014-07-25 LAB — COMPREHENSIVE METABOLIC PANEL
ALBUMIN: 2.8 g/dL — AB (ref 3.5–5.2)
ALT: 183 U/L — ABNORMAL HIGH (ref 0–35)
ANION GAP: 16 — AB (ref 5–15)
AST: 60 U/L — ABNORMAL HIGH (ref 0–37)
Alkaline Phosphatase: 238 U/L — ABNORMAL HIGH (ref 39–117)
BILIRUBIN TOTAL: 0.3 mg/dL (ref 0.3–1.2)
BUN: 38 mg/dL — AB (ref 6–23)
CHLORIDE: 82 meq/L — AB (ref 96–112)
CO2: 21 mEq/L (ref 19–32)
Calcium: 9.8 mg/dL (ref 8.4–10.5)
Creatinine, Ser: 1.05 mg/dL (ref 0.50–1.10)
GFR calc Af Amer: 53 mL/min — ABNORMAL LOW (ref >90)
GFR calc non Af Amer: 46 mL/min — ABNORMAL LOW (ref >90)
Glucose, Bld: 151 mg/dL — ABNORMAL HIGH (ref 70–99)
POTASSIUM: 4.9 meq/L (ref 3.7–5.3)
Sodium: 119 mEq/L — CL (ref 137–147)
TOTAL PROTEIN: 5.8 g/dL — AB (ref 6.0–8.3)

## 2014-07-25 LAB — CBC
HCT: 21.4 % — ABNORMAL LOW (ref 36.0–46.0)
Hemoglobin: 7.1 g/dL — ABNORMAL LOW (ref 12.0–15.0)
MCH: 23.4 pg — ABNORMAL LOW (ref 26.0–34.0)
MCHC: 33.2 g/dL (ref 30.0–36.0)
MCV: 70.4 fL — ABNORMAL LOW (ref 78.0–100.0)
PLATELETS: 357 10*3/uL (ref 150–400)
RBC: 3.04 MIL/uL — AB (ref 3.87–5.11)
RDW: 22.1 % — AB (ref 11.5–15.5)
WBC: 10.9 10*3/uL — ABNORMAL HIGH (ref 4.0–10.5)

## 2014-07-25 LAB — CBC WITH DIFFERENTIAL/PLATELET
BASOS ABS: 0 10*3/uL (ref 0.0–0.1)
Basophils Relative: 0 % (ref 0–1)
EOS ABS: 0 10*3/uL (ref 0.0–0.7)
Eosinophils Relative: 0 % (ref 0–5)
HEMATOCRIT: 22.3 % — AB (ref 36.0–46.0)
Hemoglobin: 7.4 g/dL — ABNORMAL LOW (ref 12.0–15.0)
LYMPHS PCT: 8 % — AB (ref 12–46)
Lymphs Abs: 0.9 10*3/uL (ref 0.7–4.0)
MCH: 23.6 pg — ABNORMAL LOW (ref 26.0–34.0)
MCHC: 33.2 g/dL (ref 30.0–36.0)
MCV: 71 fL — ABNORMAL LOW (ref 78.0–100.0)
Monocytes Absolute: 0.9 10*3/uL (ref 0.1–1.0)
Monocytes Relative: 8 % (ref 3–12)
NEUTROS ABS: 9.8 10*3/uL — AB (ref 1.7–7.7)
Neutrophils Relative %: 84 % — ABNORMAL HIGH (ref 43–77)
Platelets: 371 10*3/uL (ref 150–400)
RBC: 3.14 MIL/uL — ABNORMAL LOW (ref 3.87–5.11)
RDW: 22.2 % — AB (ref 11.5–15.5)
WBC: 11.6 10*3/uL — ABNORMAL HIGH (ref 4.0–10.5)

## 2014-07-25 LAB — URINALYSIS, ROUTINE W REFLEX MICROSCOPIC
Bilirubin Urine: NEGATIVE
Glucose, UA: NEGATIVE mg/dL
HGB URINE DIPSTICK: NEGATIVE
Ketones, ur: NEGATIVE mg/dL
Leukocytes, UA: NEGATIVE
Nitrite: NEGATIVE
PH: 7 (ref 5.0–8.0)
Protein, ur: NEGATIVE mg/dL
SPECIFIC GRAVITY, URINE: 1.011 (ref 1.005–1.030)
UROBILINOGEN UA: 0.2 mg/dL (ref 0.0–1.0)

## 2014-07-25 LAB — MAGNESIUM: MAGNESIUM: 1.9 mg/dL (ref 1.5–2.5)

## 2014-07-25 LAB — TROPONIN I: Troponin I: <0.3 ng/mL (ref <0.30)

## 2014-07-25 MED ORDER — ASPIRIN EC 81 MG PO TBEC
81.0000 mg | DELAYED_RELEASE_TABLET | Freq: Every day | ORAL | Status: DC
  Administered 2014-07-26 – 2014-08-01 (×7): 81 mg via ORAL
  Filled 2014-07-25 (×7): qty 1

## 2014-07-25 MED ORDER — ACETAMINOPHEN 500 MG PO TABS
500.0000 mg | ORAL_TABLET | Freq: Four times a day (QID) | ORAL | Status: DC
  Administered 2014-07-26 – 2014-08-01 (×23): 500 mg via ORAL
  Filled 2014-07-25 (×31): qty 1

## 2014-07-25 MED ORDER — POLYVINYL ALCOHOL 1.4 % OP SOLN: 1.0000 [drp] | OPHTHALMIC | Status: DC | PRN

## 2014-07-25 MED ORDER — HYDROCORTISONE ACETATE 25 MG RE SUPP
25.0000 mg | Freq: Two times a day (BID) | RECTAL | Status: DC
  Administered 2014-07-25 – 2014-08-01 (×13): 25 mg via RECTAL
  Filled 2014-07-25 (×14): qty 1

## 2014-07-25 MED ORDER — HYDROCORTISONE 2.5 % RE CREA
TOPICAL_CREAM | Freq: Three times a day (TID) | RECTAL | Status: DC
  Administered 2014-07-26 – 2014-07-27 (×3): via RECTAL
  Administered 2014-07-27: 1 via RECTAL
  Administered 2014-07-28 (×2): via RECTAL
  Administered 2014-07-29: 1 via RECTAL
  Administered 2014-07-29 – 2014-07-30 (×5): via RECTAL
  Administered 2014-07-31: 1 via RECTAL
  Administered 2014-07-31 – 2014-08-01 (×3): via RECTAL
  Filled 2014-07-25 (×2): qty 28.35

## 2014-07-25 MED ORDER — SODIUM CHLORIDE 0.9 % IV SOLN
Freq: Once | INTRAVENOUS | Status: AC
  Administered 2014-07-25: via INTRAVENOUS

## 2014-07-25 MED ORDER — FERROUS FUMARATE 325 (106 FE) MG PO TABS
1.0000 | ORAL_TABLET | Freq: Every day | ORAL | Status: DC
  Administered 2014-07-26 – 2014-08-01 (×7): 106 mg via ORAL
  Filled 2014-07-25 (×9): qty 1

## 2014-07-25 MED ORDER — HYDROCODONE-ACETAMINOPHEN 7.5-325 MG/15ML PO SOLN: 15.0000 mL | Freq: Four times a day (QID) | ORAL | Status: DC | PRN

## 2014-07-25 MED ORDER — SODIUM CHLORIDE 0.9 % IV BOLUS (SEPSIS)
500.0000 mL | Freq: Once | INTRAVENOUS | Status: AC
  Administered 2014-07-25: 500 mL via INTRAVENOUS

## 2014-07-25 MED ORDER — HALOPERIDOL LACTATE 2 MG/ML PO CONC
2.0000 mg | ORAL | Status: DC | PRN
  Administered 2014-07-26 – 2014-08-01 (×2): 2 mg via ORAL
  Filled 2014-07-25 (×5): qty 1

## 2014-07-25 MED ORDER — SENNOSIDES-DOCUSATE SODIUM 8.6-50 MG PO TABS
1.0000 | ORAL_TABLET | Freq: Two times a day (BID) | ORAL | Status: DC
  Administered 2014-07-25 – 2014-08-01 (×14): 1 via ORAL
  Filled 2014-07-25 (×14): qty 1

## 2014-07-25 MED ORDER — DILTIAZEM LOAD VIA INFUSION
10.0000 mg | Freq: Once | INTRAVENOUS | Status: AC
  Administered 2014-07-25: 10 mg via INTRAVENOUS

## 2014-07-25 MED ORDER — CIPROFLOXACIN HCL 500 MG PO TABS
500.0000 mg | ORAL_TABLET | Freq: Once | ORAL | Status: AC
  Administered 2014-07-25: 500 mg via ORAL
  Filled 2014-07-25: qty 1

## 2014-07-25 MED ORDER — DILTIAZEM HCL 100 MG IV SOLR
5.0000 mg/h | INTRAVENOUS | Status: DC
  Administered 2014-07-25: 5 mg/h via INTRAVENOUS
  Administered 2014-07-25: 12.5 mg/h via INTRAVENOUS

## 2014-07-25 MED ORDER — DILTIAZEM HCL 25 MG/5ML IV SOLN: 10.0000 mg | Freq: Once | INTRAVENOUS | Status: DC

## 2014-07-25 MED ORDER — LORAZEPAM 0.5 MG PO TABS
0.5000 mg | ORAL_TABLET | Freq: Every evening | ORAL | Status: DC | PRN
  Administered 2014-07-26 – 2014-07-31 (×3): 0.5 mg via ORAL
  Filled 2014-07-25 (×3): qty 1

## 2014-07-25 MED ORDER — SODIUM CHLORIDE 0.9 % IJ SOLN
3.0000 mL | Freq: Two times a day (BID) | INTRAMUSCULAR | Status: DC
  Administered 2014-07-25 – 2014-08-01 (×11): 3 mL via INTRAVENOUS

## 2014-07-25 MED ORDER — PANTOPRAZOLE SODIUM 40 MG PO TBEC
40.0000 mg | DELAYED_RELEASE_TABLET | Freq: Every day | ORAL | Status: DC
  Administered 2014-07-26 – 2014-08-01 (×7): 40 mg via ORAL
  Filled 2014-07-25 (×7): qty 1

## 2014-07-25 MED ORDER — TECHNETIUM TO 99M ALBUMIN AGGREGATED
5.2000 | Freq: Once | INTRAVENOUS | Status: AC | PRN
  Administered 2014-07-25: 5 via INTRAVENOUS

## 2014-07-25 MED ORDER — TECHNETIUM TC 99M DIETHYLENETRIAME-PENTAACETIC ACID: 36.5000 | Freq: Once | INTRAVENOUS | Status: AC | PRN

## 2014-07-25 MED ORDER — DEXTROSE 5 % IV SOLN: 5.0000 mg/h | Freq: Once | INTRAVENOUS | Status: DC

## 2014-07-25 MED ORDER — LEVOTHYROXINE SODIUM 50 MCG PO TABS
50.0000 ug | ORAL_TABLET | Freq: Every day | ORAL | Status: DC
  Administered 2014-07-26 – 2014-08-01 (×7): 50 ug via ORAL
  Filled 2014-07-25 (×7): qty 1

## 2014-07-25 MED ORDER — DIGOXIN 125 MCG PO TABS
0.1250 mg | ORAL_TABLET | Freq: Every day | ORAL | Status: DC
  Administered 2014-07-26 – 2014-08-01 (×7): 0.125 mg via ORAL
  Filled 2014-07-25 (×7): qty 1

## 2014-07-25 MED ORDER — METOPROLOL TARTRATE 50 MG PO TABS
100.0000 mg | ORAL_TABLET | Freq: Two times a day (BID) | ORAL | Status: DC
  Administered 2014-07-25 – 2014-08-01 (×13): 100 mg via ORAL
  Filled 2014-07-25 (×3): qty 2
  Filled 2014-07-25: qty 4
  Filled 2014-07-25 (×8): qty 2
  Filled 2014-07-25: qty 4

## 2014-07-25 NOTE — H&P (Addendum)
[facility] History and Physical  Kirsten Golden YQM:[id number] DOB: 1924/05/08 DOA: 07/25/2014  Referring physician: ED physician PCP: Casilda Carls, MD  Specialists:   Chief Complaint: AMS, shortness of breath and Tachycardia  HPI: Kirsten Golden is a 80 y.o. female with past medical history of A. Fib (not on anticoagulant), CHF, hyperlipidemia, hypertension, hypothyroidism, colon cancer (s/p of partial colectomy on 07/06/14), who presents with tachycardia, shortness of breath and altered mental status.  Patient can not speak English and also has altered mental status, it is very difficult to get medical history from the patient herself. Per patient's son and daughter, patient had partial colectomy due to metastatic colon cancer on 07/06/14. her surgery went well and surgical wound healed well, but patient's mental status has been worsening than her baseline, which seems to be progressively getting worse.   Patient is now under hospice care. She was diagnosed with A. Fib and started with Digoxin and metoprolol. She has been compliant to her medications. On 10/30, the hospice nurse noticed that patient's heart rate has increased.  At one point, her heart rate reached about 170/min. Patient has shortness of breath, but no chest pain. She was noticed to have frequent spitting and mild cough. She does not have fever, chills, diarrhea. She was also noticed to have puffy eyes.  Daughter states that hospice instructed them to come to the hospital. Her daughter also states that they want her to be admitted for treatment.   Per prior records patient was comfort care only, but daughter states that she definitely would want patient to be treated in the hospital for her sob and elevated heart rate.   In ED, patient was found to have Hgb drop from 8.2 on 07/14/14 to 7.4 today, Leukocytosis with WBC 11.6. Hyponatremia with sodium 11.9, her sodium was 130 on 07/10/14. X-ray has no  infiltration, but with increased pulmonary vascularity compatible with congestive heart. V/Q scan showed low probability of pulmonary embolus failure.  Review of Systems: As presented in the history of presenting illness, rest negative.  Where does patient live? Lives at home under hospice care  Can patient participate in ADLs? None  Allergy: No Known Allergies  Past Medical History  Diagnosis Date  . Colon cancer   . Allergy   . Constipation   . Frequent urination   . Hypothyroidism   . Vitamin D deficiency   . Hyperparathyroidism   . Hypertension   . Pure hypercholesterolemia   . Osteoporosis   . Chronic renal insufficiency   . Atrial fibrillation   . CHF (congestive heart failure)   . Lung nodule     Past Surgical History  Procedure Laterality Date  . Laparoscopic partial colectomy N/A 07/06/2014    Procedure: LAPAROSCOPIC ASSISTED TRANSVERSE COLECTOMY;  Surgeon: Pedro Earls, MD;  Location: [facility];  Service: General;  Laterality: N/A;  . Tumor surgery      Social History:  reports that she has never smoked. She does not have any smokeless tobacco history on file. She reports that she does not drink alcohol or use illicit drugs.  Family History:  Family History  Problem Relation Age of Onset  . Family history unknown: Yes     Prior to Admission medications   Medication Sig Start Date End Date Taking? Authorizing Provider  aspirin EC 81 MG EC tablet Take 1 tablet (81 mg total) by mouth daily. 07/16/14  Yes Barton Dubois, MD  ciprofloxacin (CIPRO) 500 MG tablet Take 500 mg by mouth 2 (  two) times daily. For 7 days. 11-4 is day 7   Yes Historical Provider, MD  diclofenac sodium (VOLTAREN) 1 % GEL Apply 2 g topically 4 (four) times daily.   Yes Historical Provider, MD  digoxin (LANOXIN) 0.125 MG tablet Take 0.125 mg by mouth daily.   Yes Historical Provider, MD  ferrous fumarate (HEMOCYTE - 106 MG FE) 325 (106 FE) MG TABS tablet Take 1 tablet by mouth.   Yes  Historical Provider, MD  furosemide (LASIX) 20 MG tablet Take 1 tablet (20 mg total) by mouth daily. 07/16/14  Yes Barton Dubois, MD  haloperidol (HALDOL) 2 MG/ML solution Take 2 mg by mouth every 4 (four) hours as needed for agitation.   Yes Historical Provider, MD  HYDROcodone-acetaminophen (HYCET) 7.5-325 mg/15 ml solution Take 15 mLs by mouth every 6 (six) hours as needed for moderate pain. 07/16/14 07/16/15 Yes Barton Dubois, MD  hydrocortisone (ANUSOL-HC) 2.5 % rectal cream Place rectally 3 (three) times daily. 07/16/14  Yes Barton Dubois, MD  hydrocortisone (ANUSOL-HC) 25 MG suppository Place 1 suppository (25 mg total) rectally 2 (two) times daily. 07/16/14  Yes Barton Dubois, MD  levothyroxine (SYNTHROID, LEVOTHROID) 50 MCG tablet Take 50 mcg by mouth daily before breakfast.   Yes Historical Provider, MD  metoprolol (LOPRESSOR) 100 MG tablet Take 1 tablet (100 mg total) by mouth 2 (two) times daily. 07/20/14  Yes Wellington Hampshire, MD  pantoprazole (PROTONIX) 40 MG tablet Take 1 tablet (40 mg total) by mouth daily. 07/16/14  Yes Barton Dubois, MD  polyvinyl alcohol (LIQUIFILM TEARS) 1.4 % ophthalmic solution Place 1 drop into both eyes as needed for dry eyes.   Yes Historical Provider, MD  senna-docusate (SENOKOT-S) 8.6-50 MG per tablet Take 1 tablet by mouth 2 (two) times daily. 07/16/14  Yes Barton Dubois, MD  acetaminophen (TYLENOL) 500 MG tablet Take 1 tablet (500 mg total) by mouth 4 (four) times daily. 07/16/14   Barton Dubois, MD  LORazepam (ATIVAN) 0.5 MG tablet Take 1 tablet (0.5 mg total) by mouth at bedtime as needed for sleep. 07/16/14   Barton Dubois, MD  triamterene-hydrochlorothiazide (DYAZIDE) 50-25 MG per capsule Take 1 capsule by mouth daily.    Historical Provider, MD    Physical Exam: Filed Vitals:   07/25/14 2200 07/25/14 2230 07/25/14 2256 07/25/14 2300  BP: 138/79 128/80 113/75 119/80  Pulse: 103 105 85 126  Temp:   97.9 F (36.6 C)   TempSrc:   Oral   Resp: 25  29 18 20   Height:    4\' 9"  (1.448 m)  Weight:    53.5 kg (117 lb 15.1 oz)  SpO2: 91% 90% 93% 93%   General: Not in acute distress. Has AMS, following commands partially.  HEENT:       Eyes: PERRL, EOMI, no scleral icterus       ENT: No discharge from the ears and nose, no pharynx injection, no tonsillar enlargement.        Neck: postive JVD, no bruit, no mass felt. Cardiac: S1/S2, RRR, No murmurs, gallops or rubs Pulm: Good air movement bilaterally. Clear to auscultation bilaterally. No rales, wheezing, rhonchi or rubs. Abd: Soft, moderately distended, not sure whether patient has tenderness or not, no organomegaly, BS present Ext: trace leg edema bilaterally. 2+DP/PT pulse bilaterally Musculoskeletal: No joint deformities, erythema, or stiffness, ROM full Skin: No rashes.  Neuro: Not oriented X3, cranial nerves II-XII grossly intact, moves all extremities. Psych: Patient is not psychotic, no suicidal or hemocidal ideation.  Labs on Admission:  Basic Metabolic Panel:  Recent Labs Lab 07/25/14 1633  NA 119*  K 4.9  CL 82*  CO2 21  GLUCOSE 151*  BUN 38*  CREATININE 1.05  CALCIUM 9.8   Liver Function Tests:  Recent Labs Lab 07/25/14 1633  AST 60*  ALT 183*  ALKPHOS 238*  BILITOT 0.3  PROT 5.8*  ALBUMIN 2.8*   No results for input(s): LIPASE, AMYLASE in the last 168 hours. No results for input(s): AMMONIA in the last 168 hours. CBC:  Recent Labs Lab 07/25/14 1633 07/25/14 2316  WBC 11.6* 10.9*  NEUTROABS 9.8*  --   HGB 7.4* 7.1*  HCT 22.3* 21.4*  MCV 71.0* 70.4*  PLT 371 357   Cardiac Enzymes:  Recent Labs Lab 07/25/14 1633  TROPONINI <0.30    BNP (last 3 results) No results for input(s): PROBNP in the last 8760 hours. CBG: No results for input(s): GLUCAP in the last 168 hours.  Radiological Exams on Admission: Dg Chest 2 View  07/25/2014   CLINICAL DATA:  History of metastatic colonic malignancy, atrial fibrillation, and CHF; onset of  tachycardia and hypertension today  EXAM: CHEST  2 VIEW  COMPARISON:  PA and lateral chest of July 13, 2014  FINDINGS: There is increased density at the right lung base consistent with increased pleural fluid. On the left there is persistent obscuration of portions of the left heart border and of the hemidiaphragm. The cardiac silhouette is enlarged. The central pulmonary vascularity is prominent. There is tortuosity of the ascending and descending thoracic aorta. There is a large hiatal hernia. The observed bony structures are unremarkable.  IMPRESSION: Increased pleural fluid collections bilaterally as well as increased pulmonary vascularity are most compatible with congestive heart failure.   Electronically Signed   By: David  Martinique   On: 07/25/2014 16:31   Nm Pulmonary Perf And Vent  07/25/2014   CLINICAL DATA:  Shortness of Breath  EXAM: NUCLEAR MEDICINE VENTILATION - PERFUSION LUNG SCAN  Views: Anterior, posterior, left lateral, right lateral, RPO, LPO, RAO, LAO -ventilation and perfusion  Radionuclide: Technetium 58m DTPA -ventilation; Technetium 48m macroaggregated albumin- perfusion  Dose:  36.5 mCi-ventilation; 5.2 mCi- perfusion  Route of administration: Inhalation-ventilation; intravenous-perfusion  COMPARISON:  Chest radiograph July 25, 2014  FINDINGS: Ventilation: There is absent ventilation in the lateral right base in an area of consolidation seen on the chest radiograph. Elsewhere, the ventilation is essentially unremarkable bilaterally. Cardiomegaly is noted.  Perfusion: There is absent perfusion in the area of consolidation in the lateral right base which corresponds to an area of absent ventilation. There is cardiomegaly. Elsewhere, the perfusion appears unremarkable. No appreciable ventilation/ perfusion mismatch is seen.  IMPRESSION: There is a matching ventilation perfusion defect in the lateral right base which corresponds to an area of consolidation on the chest radiograph. This  finding is in a nonsegmental distribution. There are no appreciable ventilation/perfusion mismatches. This study over all constitutes a low probability of pulmonary embolus.   Electronically Signed   By: Lowella Grip M.D.   On: 07/25/2014 20:14    EKG: Independently reviewed.  Afib/flutter with RVR  Assessment/Plan Principal Problem:   Atrial fibrillation with rapid ventricular response Active Problems:   Anemia   GIB (gastrointestinal bleeding)   Colon cancer   Hypothyroidism   Hypertension   CHF (congestive heart failure)   Lung nodule   Encephalopathy   Hyponatremia  A fib with RVR: on digoxin and metoprolol at home. Coming in with RVR  which responded to Cardizem gtt in ED.  The triggering factor is not clear. Her Hgb dropped from 8.2 to 7.4 which may have contributed. Other multiple factors likely also contributed, include CHF and hyponatremia. Due to recent bleeding and hospice/comfort care, patient was not on anticoagulation at home except for ASA.  - Will admit to SDU - continue Cardizem gtt - Continue home metoprolol and digoxin, and check digoxin level - trop x 3 - EKG in AM - check TSH  Hyponatremia: patient's sodium is 119 on admission, her sodium was 130 on 07/10/14. Etiology is not clear, likely due to Lasix and Dyazid use and decreased oral intake. It is not clear how acute she developed hyponatremia, will assume it is more 48 hours, i.e. chronic. Since patient has AMS which may be related to hyponatremia, will need slow correction.  - will treat with 3% NaCl at 50 cc/h for 6 hours, which will roughly increase her Na level by 3.0  - check BMP q4h - check TSH  - hold lasix and dyazide tonight - check urine Osma and plasma Osma, urine sodium level  Encephalopathy: per patient's daughter and son, patient's mental status has been altered when she went home after surgery. Her mental status seems to be gradually worsening. The etiology is not clear, but likely to be  multifactorial, including cardiac arrhythmia, hyponatremia, anemia. Digoxin toxicity needs to be ruled out. Another possibility is intracranial metastases of colon cancer. - will treat underlying problems: transfuse for anemia, correction of hyponatremia - check digoxin level - NPO until SLP done  Anemia: pt is s/p transfusion during previous admission;  Hgb dropped slightly from 8.2 to 7.4. May be due to GIB from colon cancer. Given her complicated condition and possible on-going GIB and A fib with RVR, I will transfuse pt with 1 unit of blood. Patient has distended abdomen on examination. It is not sure whether patient has abdominal pain because of AMS - cbc q6h - transfuse 1 unit of blood - will get CT-abd/pelvis to r/o intra-abdominal abnormalities - FOBT  Renal mass and metastatic colon cancer: previously discussed with urology service and oncologist, decided not to pursue any further treatment or workup given hospice care - observe closely now  SOB: likely 2/2 interstitial edema and diastolic HF from A. Fib. X-ray has no infiltration, but with increased pulmonary vascularity compatible with congestive heart. V/Q scan showed low probability of pulmonary embolus -hold lasix given her severe Hyponatremia -nasal canula oxygen  Diastolic CHF: 2-D echo on 07/19/14 showed EF 55-60%. She is slightly volume overloaded on admission, with trace amount leg edema. She is on lasix and dyazide at home. Given her severe hyponatremia which may have attributed to encephalopathy, we have to hold diuretics for now. - hold diuretics - check proBNP - trop x 3 as above  Hypothyroidism:   - continue synthroid - check TSH  Essential HTN: -On metoprolol, cardizem   GERD: on protonix  UTI: she has been taking cipro in the past 6 days, UA is clean today.  - will let pt complete her 7-day course of cipro, need one more dose.   DVT ppx: SCD  Code Status: DNR Family Communication:  Yes, patient's   daughter and son  at bed side Disposition Plan: Admit to inpatientt   Date of Service 07/25/2014    Ivor Costa [facility] Pager [phone]  If 7PM-7AM, please contact night-coverage [email or web address] Password [email or web address] 07/25/2014, 11:57 PM

## 2014-07-25 NOTE — ED Notes (Signed)
Pt has hx of a-fib.  Pt is on hospice care for cancer.  Home nurse checked patient and bp/hr elevated.  Sent her for evaluation.

## 2014-07-25 NOTE — ED Provider Notes (Signed)
CSN: [id number]     Arrival date & time 07/25/14  1513 History   First MD Initiated Contact with Patient 07/25/14 1549     Chief Complaint  Patient presents with  . Tachycardia     (Consider location/radiation/quality/duration/timing/severity/associated sxs/prior Treatment) HPI  Pt presenting with c/o tachycardia.  Pt noted to be in rapid atrial fibrillation by hospice nurse and advised to come to the ED. Family also states that she has been progressively more short of breath over the past 2 weeks. Denies chest pain.  No fever/chills.  She also has been having some intermittent leg swelling, yesterday family noted her eyelids to be swollen.  They deny fever.  Denies chest pain.  Has continued to eat and drink at her baseline.  Hospice nurse has been checking heart rate and it has been elevated at home over the past week.  Daughter states hospice instructed them to come to the hospital and daughter also states that they want her to be admitted for treatment.  Per prior records patient was comfort care only, but daughter states that she definitely would want her to be treated in the hospital for her sob and elevated heart rate. There are no other associated systemic symptoms, there are no other alleviating or modifying factors.   Past Medical History  Diagnosis Date  . Colon cancer   . Allergy   . Constipation   . Frequent urination   . Hypothyroidism   . Vitamin D deficiency   . Hyperparathyroidism   . Hypertension   . Pure hypercholesterolemia   . Osteoporosis   . Chronic renal insufficiency   . Atrial fibrillation   . CHF (congestive heart failure)   . Lung nodule    Past Surgical History  Procedure Laterality Date  . Laparoscopic partial colectomy N/A 07/06/2014    Procedure: LAPAROSCOPIC ASSISTED TRANSVERSE COLECTOMY;  Surgeon: Pedro Earls, MD;  Location: [facility];  Service: General;  Laterality: N/A;  . Tumor surgery     Family History  Problem Relation Age of Onset  .  Family history unknown: Yes   History  Substance Use Topics  . Smoking status: Never Smoker   . Smokeless tobacco: Not on file  . Alcohol Use: No   OB History    No data available     Review of Systems  ROS reviewed and all otherwise negative except for mentioned in HPI    Allergies  Review of patient's allergies indicates no known allergies.  Home Medications   Prior to Admission medications   Medication Sig Start Date End Date Taking? Authorizing Provider  aspirin EC 81 MG EC tablet Take 1 tablet (81 mg total) by mouth daily. 07/16/14  Yes Barton Dubois, MD  ciprofloxacin (CIPRO) 500 MG tablet Take 500 mg by mouth 2 (two) times daily. For 7 days. 11-4 is day 7   Yes Historical Provider, MD  diclofenac sodium (VOLTAREN) 1 % GEL Apply 2 g topically 4 (four) times daily.   Yes Historical Provider, MD  digoxin (LANOXIN) 0.125 MG tablet Take 0.125 mg by mouth daily.   Yes Historical Provider, MD  ferrous fumarate (HEMOCYTE - 106 MG FE) 325 (106 FE) MG TABS tablet Take 1 tablet by mouth.   Yes Historical Provider, MD  furosemide (LASIX) 20 MG tablet Take 1 tablet (20 mg total) by mouth daily. 07/16/14  Yes Barton Dubois, MD  haloperidol (HALDOL) 2 MG/ML solution Take 2 mg by mouth every 4 (four) hours as needed for agitation.  Yes Historical Provider, MD  HYDROcodone-acetaminophen (HYCET) 7.5-325 mg/15 ml solution Take 15 mLs by mouth every 6 (six) hours as needed for moderate pain. 07/16/14 07/16/15 Yes Barton Dubois, MD  hydrocortisone (ANUSOL-HC) 2.5 % rectal cream Place rectally 3 (three) times daily. 07/16/14  Yes Barton Dubois, MD  hydrocortisone (ANUSOL-HC) 25 MG suppository Place 1 suppository (25 mg total) rectally 2 (two) times daily. 07/16/14  Yes Barton Dubois, MD  levothyroxine (SYNTHROID, LEVOTHROID) 50 MCG tablet Take 50 mcg by mouth daily before breakfast.   Yes Historical Provider, MD  metoprolol (LOPRESSOR) 100 MG tablet Take 1 tablet (100 mg total) by mouth 2 (two)  times daily. 07/20/14  Yes Wellington Hampshire, MD  pantoprazole (PROTONIX) 40 MG tablet Take 1 tablet (40 mg total) by mouth daily. 07/16/14  Yes Barton Dubois, MD  polyvinyl alcohol (LIQUIFILM TEARS) 1.4 % ophthalmic solution Place 1 drop into both eyes as needed for dry eyes.   Yes Historical Provider, MD  senna-docusate (SENOKOT-S) 8.6-50 MG per tablet Take 1 tablet by mouth 2 (two) times daily. 07/16/14  Yes Barton Dubois, MD  acetaminophen (TYLENOL) 500 MG tablet Take 1 tablet (500 mg total) by mouth 4 (four) times daily. 07/16/14   Barton Dubois, MD  LORazepam (ATIVAN) 0.5 MG tablet Take 1 tablet (0.5 mg total) by mouth at bedtime as needed for sleep. 07/16/14   Barton Dubois, MD  triamterene-hydrochlorothiazide (DYAZIDE) 50-25 MG per capsule Take 1 capsule by mouth daily.    Historical Provider, MD   BP 119/80 mmHg  Pulse 126  Temp(Src) 97.9 F (36.6 C) (Oral)  Resp 20  Ht 4\' 9"  (1.448 m)  Wt 117 lb 15.1 oz (53.5 kg)  BMI 25.52 kg/m2  SpO2 93%  Vitals reviewed Physical Exam  Physical Examination: General appearance - alert, chronically ill appearing, and in no distress Mental status - alert, oriented to person, place, and time Eyes - no conjunctival injection, no scleral icterus Mouth - mucous membranes moist, pharynx normal without lesions Chest - clear to auscultation, no wheezes, rales or rhonchi, symmetric air entry Heart - normal rate, regular rhythm, normal S1, S2, no murmurs, rubs, clicks or gallops Abdomen - soft, nontender, nondistended, no masses or organomegaly Extremities - peripheral pulses normal, no pedal edema, no clubbing or cyanosis Skin - normal coloration and turgor, no rashes  ED Course  Procedures (including critical care time)  9:40 PM d/w patient's PCP in [place]- pt was placed on digoxin yesterday in addition to the metoprolol.  He confirms that patient is not comfort care only but is DNR- do not intubate, no CPR.  9:41 PM d/w Dr. Blaine Hamper, triad  hospitalist- pt admitted to step down bed, temporary admission orders written.    CRITICAL CARE Performed by: Threasa Beards Total critical care time: 40 Critical care time was exclusive of separately billable procedures and treating other patients. Critical care was necessary to treat or prevent imminent or life-threatening deterioration. Critical care was time spent personally by me on the following activities: development of treatment plan with patient and/or surrogate as well as nursing, discussions with consultants, evaluation of patient's response to treatment, examination of patient, obtaining history from patient or surrogate, ordering and performing treatments and interventions, ordering and review of laboratory studies, ordering and review of radiographic studies, pulse oximetry and re-evaluation of patient's condition. Labs Review Labs Reviewed  CBC WITH DIFFERENTIAL - Abnormal; Notable for the following:    WBC 11.6 (*)    RBC 3.14 (*)  Hemoglobin 7.4 (*)    HCT 22.3 (*)    MCV 71.0 (*)    MCH 23.6 (*)    RDW 22.2 (*)    Neutrophils Relative % 84 (*)    Lymphocytes Relative 8 (*)    Neutro Abs 9.8 (*)    All other components within normal limits  COMPREHENSIVE METABOLIC PANEL - Abnormal; Notable for the following:    Sodium 119 (*)    Chloride 82 (*)    Glucose, Bld 151 (*)    BUN 38 (*)    Total Protein 5.8 (*)    Albumin 2.8 (*)    AST 60 (*)    ALT 183 (*)    Alkaline Phosphatase 238 (*)    GFR calc non Af Amer 46 (*)    GFR calc Af Amer 53 (*)    Anion gap 16 (*)    All other components within normal limits  CBC - Abnormal; Notable for the following:    WBC 10.9 (*)    RBC 3.04 (*)    Hemoglobin 7.1 (*)    HCT 21.4 (*)    MCV 70.4 (*)    MCH 23.4 (*)    RDW 22.1 (*)    All other components within normal limits  CULTURE, BLOOD (ROUTINE X 2)  CULTURE, BLOOD (ROUTINE X 2)  MRSA PCR SCREENING  TROPONIN I  URINALYSIS, ROUTINE W REFLEX MICROSCOPIC   TROPONIN I  TROPONIN I  TROPONIN I  TSH  CBC  CBC  OSMOLALITY  OSMOLALITY, URINE  BASIC METABOLIC PANEL  BASIC METABOLIC PANEL  BASIC METABOLIC PANEL  DIGOXIN LEVEL  MAGNESIUM  PHOSPHORUS  PROTIME-INR  APTT  COMPREHENSIVE METABOLIC PANEL  SODIUM, URINE, RANDOM  CBC  CBC  BASIC METABOLIC PANEL  BASIC METABOLIC PANEL  BASIC METABOLIC PANEL  PRO B NATRIURETIC PEPTIDE  TYPE AND SCREEN  PREPARE RBC (CROSSMATCH)    Imaging Review Dg Chest 2 View  07/25/2014   CLINICAL DATA:  History of metastatic colonic malignancy, atrial fibrillation, and CHF; onset of tachycardia and hypertension today  EXAM: CHEST  2 VIEW  COMPARISON:  PA and lateral chest of July 13, 2014  FINDINGS: There is increased density at the right lung base consistent with increased pleural fluid. On the left there is persistent obscuration of portions of the left heart border and of the hemidiaphragm. The cardiac silhouette is enlarged. The central pulmonary vascularity is prominent. There is tortuosity of the ascending and descending thoracic aorta. There is a large hiatal hernia. The observed bony structures are unremarkable.  IMPRESSION: Increased pleural fluid collections bilaterally as well as increased pulmonary vascularity are most compatible with congestive heart failure.   Electronically Signed   By: David  Martinique   On: 07/25/2014 16:31   Nm Pulmonary Perf And Vent  07/25/2014   CLINICAL DATA:  Shortness of Breath  EXAM: NUCLEAR MEDICINE VENTILATION - PERFUSION LUNG SCAN  Views: Anterior, posterior, left lateral, right lateral, RPO, LPO, RAO, LAO -ventilation and perfusion  Radionuclide: Technetium 43m DTPA -ventilation; Technetium 4m macroaggregated albumin- perfusion  Dose:  36.5 mCi-ventilation; 5.2 mCi- perfusion  Route of administration: Inhalation-ventilation; intravenous-perfusion  COMPARISON:  Chest radiograph July 25, 2014  FINDINGS: Ventilation: There is absent ventilation in the lateral right base  in an area of consolidation seen on the chest radiograph. Elsewhere, the ventilation is essentially unremarkable bilaterally. Cardiomegaly is noted.  Perfusion: There is absent perfusion in the area of consolidation in the lateral right base which corresponds  to an area of absent ventilation. There is cardiomegaly. Elsewhere, the perfusion appears unremarkable. No appreciable ventilation/ perfusion mismatch is seen.  IMPRESSION: There is a matching ventilation perfusion defect in the lateral right base which corresponds to an area of consolidation on the chest radiograph. This finding is in a nonsegmental distribution. There are no appreciable ventilation/perfusion mismatches. This study over all constitutes a low probability of pulmonary embolus.   Electronically Signed   By: Lowella Grip M.D.   On: 07/25/2014 20:14     EKG Interpretation   Date/Time:  Wednesday July 25 2014 15:27:34 EST Ventricular Rate:  122 PR Interval:    QRS Duration: 71 QT Interval:  318 QTC Calculation: 453 R Axis:   30 Text Interpretation:  Atrial fibrillation with rapid ventricular response  Borderline T abnormalities, inferior leads Baseline wander in lead(s) V6  No significant change since last tracing Confirmed by [facility]  MD, MARTHA  [phone]) on 07/25/2014 8:56:30 PM      MDM   Final diagnoses:  Shortness of breath  rapid atrial fibrillation Metastatic colon cancer Anemia hyponatremia  Pt presenting with rapid afib, shortness of breath.  Pt is recently diagnosed with metastatic colon cancer and is on hospice care.  Family agree that she is DNR but do want her to be hospitalized and admitted for her current condition.  Pt is on diltiazem drip which is currently controlling her heart rate.  No PE on VQ scan.  Pt is more anemic than her baseline and is hyponatremic, pt started on IV fluids in the ED.  D/w triad for admission to step down.     Threasa Beards, MD 07/26/14 1901

## 2014-07-25 NOTE — ED Notes (Signed)
Hospice representative at bedside. Patient is DNR-Full. Would like medication intervention but no chest compressions and intubation.

## 2014-07-25 NOTE — ED Notes (Signed)
Patient transported to X-ray 

## 2014-07-25 NOTE — ED Notes (Signed)
Patient transported to [place] 

## 2014-07-25 NOTE — ED Notes (Signed)
Per Hospice RN, Pt needs evaluated for rapid A Fib.  Sts they have attempted several interventions w/o resolve.  Recently started on liquid morphine.  Pt recently discharged from [facility], after admission for GI bleed.  Hx colon and renal CA w/ mets.     Pt has a DNR and unsure if she'll have it w/ her.

## 2014-07-25 NOTE — Progress Notes (Signed)
Pt on floor from Ed with daughter and son at bedside.

## 2014-07-25 NOTE — ED Notes (Signed)
Patient just urinated in bedpan. Will complete in and out promptly.

## 2014-07-25 NOTE — ED Notes (Signed)
Called to get report nurse unavailable will call back.

## 2014-07-25 NOTE — Progress Notes (Addendum)
Inpatient [facility] ED Rm 6 [facility]- RN Visit Pt newly admitted to [facility] for metastatic Renal cancer; pt is a DNR code status at home, an OOF DNR GOLD form was in the home; family did not bring this to the hospital.  Notified by [place] that pt was being transported by family to [facility] for evaluation of rapid HR 120-140, RR=28-40 and elevated BP; [facility] home care team has been working with PCP Dr Rosario Jacks and [facility] physician Dr Lyman Speller over the last couple of days to try to address symptom concerns at home; O2, Roxanol, Hadol and Digoxin were initiated; attempts x2 yesterday to draw bloodwork were not successful; pt does have history of A-fib w RVR prior to starting hospice services. During [facility] follow-up home visit today, pt uncontrolled symptoms (increased work of breathing; rapid heart rate) continued and daughter [name] wished to have pt evaluated at ED. Writer arrived to ED pt seen on stretcher, awake, alert; had arrived in room 10 minutes prior, and was taken to [place] just after Probation officer arrived to room.  Pt speaks only [place] -daughter [name] in room and translates for patient. ED work- up in process;   Daughter aware [facility] will continue to follow. Spoke with staff RN Benjamine Mola who is aware [facility] on-call RN will follow up to inquire about pt status/ disposition. Daughter [name] also has [place] on-call information. [facility]  home medication list left on bedside table; staff RN [facility] aware. Please call [facility] at [phone] with any questions.Thank you   Danton Sewer, RN MSN [facility] Liaison [phone]

## 2014-07-26 ENCOUNTER — Inpatient Hospital Stay (HOSPITAL_COMMUNITY)

## 2014-07-26 ENCOUNTER — Encounter (HOSPITAL_COMMUNITY): Payer: Self-pay | Admitting: *Deleted

## 2014-07-26 DIAGNOSIS — I5033 Acute on chronic diastolic (congestive) heart failure: Secondary | ICD-10-CM

## 2014-07-26 DIAGNOSIS — R140 Abdominal distension (gaseous): Secondary | ICD-10-CM

## 2014-07-26 LAB — PROTIME-INR
INR: 1.26 (ref 0.00–1.49)
Prothrombin Time: 15.9 seconds — ABNORMAL HIGH (ref 11.6–15.2)

## 2014-07-26 LAB — COMPREHENSIVE METABOLIC PANEL
ALBUMIN: 2.6 g/dL — AB (ref 3.5–5.2)
ALK PHOS: 216 U/L — AB (ref 39–117)
ALT: 155 U/L — ABNORMAL HIGH (ref 0–35)
AST: 57 U/L — AB (ref 0–37)
Anion gap: 15 (ref 5–15)
BILIRUBIN TOTAL: 0.8 mg/dL (ref 0.3–1.2)
BUN: 36 mg/dL — ABNORMAL HIGH (ref 6–23)
CO2: 19 mEq/L (ref 19–32)
Calcium: 9.2 mg/dL (ref 8.4–10.5)
Chloride: 87 mEq/L — ABNORMAL LOW (ref 96–112)
Creatinine, Ser: 1.02 mg/dL (ref 0.50–1.10)
GFR calc Af Amer: 55 mL/min — ABNORMAL LOW (ref >90)
GFR calc non Af Amer: 48 mL/min — ABNORMAL LOW (ref >90)
Glucose, Bld: 126 mg/dL — ABNORMAL HIGH (ref 70–99)
POTASSIUM: 4.9 meq/L (ref 3.7–5.3)
Sodium: 121 mEq/L — CL (ref 137–147)
Total Protein: 5.3 g/dL — ABNORMAL LOW (ref 6.0–8.3)

## 2014-07-26 LAB — BASIC METABOLIC PANEL
ANION GAP: 12 (ref 5–15)
BUN: 35 mg/dL — ABNORMAL HIGH (ref 6–23)
CHLORIDE: 83 meq/L — AB (ref 96–112)
CO2: 23 meq/L (ref 19–32)
Calcium: 9.4 mg/dL (ref 8.4–10.5)
Creatinine, Ser: 0.96 mg/dL (ref 0.50–1.10)
GFR calc non Af Amer: 51 mL/min — ABNORMAL LOW (ref >90)
GFR, EST AFRICAN AMERICAN: 59 mL/min — AB (ref >90)
Glucose, Bld: 209 mg/dL — ABNORMAL HIGH (ref 70–99)
POTASSIUM: 4.5 meq/L (ref 3.7–5.3)
Sodium: 118 mEq/L — CL (ref 137–147)

## 2014-07-26 LAB — MRSA PCR SCREENING: MRSA BY PCR: NEGATIVE

## 2014-07-26 LAB — CBC
HCT: 28.2 % — ABNORMAL LOW (ref 36.0–46.0)
HEMATOCRIT: 26.7 % — AB (ref 36.0–46.0)
HEMOGLOBIN: 9.4 g/dL — AB (ref 12.0–15.0)
Hemoglobin: 8.9 g/dL — ABNORMAL LOW (ref 12.0–15.0)
MCH: 24.4 pg — ABNORMAL LOW (ref 26.0–34.0)
MCH: 24.2 pg — ABNORMAL LOW (ref 26.0–34.0)
MCHC: 33.3 g/dL (ref 30.0–36.0)
MCHC: 33.3 g/dL (ref 30.0–36.0)
MCV: 73.2 fL — ABNORMAL LOW (ref 78.0–100.0)
MCV: 72.7 fL — ABNORMAL LOW (ref 78.0–100.0)
Platelets: 363 10*3/uL (ref 150–400)
Platelets: 387 10*3/uL (ref 150–400)
RBC: 3.65 MIL/uL — AB (ref 3.87–5.11)
RBC: 3.88 MIL/uL (ref 3.87–5.11)
RDW: 23.6 % — ABNORMAL HIGH (ref 11.5–15.5)
RDW: 22.7 % — ABNORMAL HIGH (ref 11.5–15.5)
WBC: 13.2 10*3/uL — ABNORMAL HIGH (ref 4.0–10.5)
WBC: 11.1 10*3/uL — ABNORMAL HIGH (ref 4.0–10.5)

## 2014-07-26 LAB — TROPONIN I
Troponin I: <0.3 ng/mL (ref <0.30)
Troponin I: <0.3 ng/mL (ref <0.30)
Troponin I: <0.3 ng/mL (ref <0.30)

## 2014-07-26 LAB — PHOSPHORUS: PHOSPHORUS: 2.4 mg/dL (ref 2.3–4.6)

## 2014-07-26 LAB — DIGOXIN LEVEL: Digoxin Level: 0.6 ng/mL — ABNORMAL LOW (ref 0.8–2.0)

## 2014-07-26 LAB — GLUCOSE, CAPILLARY: GLUCOSE-CAPILLARY: 120 mg/dL — AB (ref 70–99)

## 2014-07-26 LAB — APTT: aPTT: 30 seconds (ref 24–37)

## 2014-07-26 LAB — SODIUM, URINE, RANDOM: SODIUM UR: 31 meq/L

## 2014-07-26 LAB — TSH: TSH: 3.5 u[IU]/mL (ref 0.350–4.500)

## 2014-07-26 LAB — OSMOLALITY: OSMOLALITY: 258 mosm/kg — AB (ref 275–300)

## 2014-07-26 LAB — PRO B NATRIURETIC PEPTIDE: PRO B NATRI PEPTIDE: 18005 pg/mL — AB (ref 0–450)

## 2014-07-26 LAB — PREPARE RBC (CROSSMATCH)

## 2014-07-26 LAB — OSMOLALITY, URINE: OSMOLALITY UR: 333 mosm/kg — AB (ref 390–1090)

## 2014-07-26 MED ORDER — MORPHINE SULFATE 2 MG/ML IJ SOLN
INTRAMUSCULAR | Status: AC
  Filled 2014-07-26: qty 1

## 2014-07-26 MED ORDER — SODIUM CHLORIDE 0.9 % IV SOLN: INTRAVENOUS | Status: DC

## 2014-07-26 MED ORDER — DILTIAZEM HCL 90 MG PO TABS
120.0000 mg | ORAL_TABLET | Freq: Two times a day (BID) | ORAL | Status: DC
  Administered 2014-07-26 – 2014-07-27 (×2): 120 mg via ORAL
  Filled 2014-07-26 (×5): qty 1

## 2014-07-26 MED ORDER — MORPHINE SULFATE 2 MG/ML IJ SOLN
0.5000 mg | Freq: Once | INTRAMUSCULAR | Status: AC
  Administered 2014-07-26: 0.5 mg via INTRAVENOUS

## 2014-07-26 MED ORDER — LEVALBUTEROL HCL 0.63 MG/3ML IN NEBU
0.6300 mg | INHALATION_SOLUTION | Freq: Four times a day (QID) | RESPIRATORY_TRACT | Status: DC | PRN
  Administered 2014-07-26 (×2): 0.63 mg via RESPIRATORY_TRACT
  Filled 2014-07-26 (×2): qty 3

## 2014-07-26 MED ORDER — SODIUM CHLORIDE 3 % IV SOLN
INTRAVENOUS | Status: AC
  Administered 2014-07-26: 50 mL/h via INTRAVENOUS
  Filled 2014-07-26: qty 500

## 2014-07-26 MED ORDER — IOHEXOL 300 MG/ML  SOLN
25.0000 mL | INTRAMUSCULAR | Status: AC
  Administered 2014-07-26: 25 mL via ORAL

## 2014-07-26 MED ORDER — IOHEXOL 300 MG/ML  SOLN
100.0000 mL | Freq: Once | INTRAMUSCULAR | Status: AC | PRN
  Administered 2014-07-26: 100 mL via INTRAVENOUS

## 2014-07-26 MED ORDER — ENSURE COMPLETE PO LIQD: 237.0000 mL | Freq: Two times a day (BID) | ORAL | Status: DC

## 2014-07-26 MED ORDER — FUROSEMIDE 10 MG/ML IJ SOLN
40.0000 mg | Freq: Once | INTRAMUSCULAR | Status: AC
  Administered 2014-07-26: 40 mg via INTRAVENOUS
  Filled 2014-07-26: qty 4

## 2014-07-26 MED ORDER — METOPROLOL TARTRATE 1 MG/ML IV SOLN
2.5000 mg | Freq: Four times a day (QID) | INTRAVENOUS | Status: DC | PRN
  Administered 2014-07-26: 2.5 mg via INTRAVENOUS
  Filled 2014-07-26: qty 5

## 2014-07-26 MED ORDER — ENSURE COMPLETE PO LIQD: 237.0000 mL | Freq: Two times a day (BID) | ORAL | Status: DC | PRN

## 2014-07-26 NOTE — Progress Notes (Signed)
RN phoned CMT to have patient placed on standby.  RN has been in the room for the last 50 minutes changing the bed,bathing patient and giving medications. Will continue to monitor the patient.

## 2014-07-26 NOTE — Progress Notes (Signed)
OT Cancellation Note  Patient Details Name: Kirsten Golden MRN: [id number] DOB: 06/23/1924   Cancelled Treatment:    Reason Eval/Treat Not Completed: Patient at procedure or test/ unavailable  Will recheck on pt as later in day or next day  [place], Thereasa Parkin

## 2014-07-26 NOTE — Care Management Note (Addendum)
    Page 1 of 2   08/01/2014     2:37:45 PM CARE MANAGEMENT NOTE 08/01/2014  Patient:  Bonfanti,Levon   Account Number:  [id number]  Date Initiated:  07/26/2014  Documentation initiated by:  DAVIS,RHONDA  Subjective/Objective Assessment:   [facility] [place] patient with a.fib with rvr     Action/Plan:   home when stable   Anticipated DC Date:  08/01/2014   Anticipated DC Plan:  [place]  In-house referral  NA      DC Planning Services  CM consult      [facility] Choice  Resumption Of Svcs/PTA Provider   Choice offered to / List presented to:  NA      DME agency  NA     [place] arranged  HH-1 RN      [place]   Status of service:  In process, will continue to follow Medicare Important Message given?   (If response is "NO", the following Medicare IM given date fields will be blank) Date Medicare IM given:   Medicare IM given by:   Date Additional Medicare IM given:   Additional Medicare IM given by:    Discharge Disposition:  [place]  Per UR Regulation:  Reviewed for med. necessity/level of care/duration of stay  If discussed at [place] of Stay Meetings, dates discussed:   07/31/2014    Comments:  08/01/14 Kieth Hartis RN,BSN NCM [phone] D/C HOME W/[facility] LIASON KAREN AWARE OF D/C.FAMILY PROVIDED OWN TRANSP.  07/31/14 Anhthu Perdew RN,BSN NCM [phone] [facility] FOLLOWING FOR RESUMPTION OF SERVICES.  07/30/14 Mackay Hanauer RN,BSN NCM [phone] CARDIO FOLLOWING-AFIB,CHF,EFFUSIONS-IV LASIX BID,CARDIZEM PO,LOPRESSOR PO.PARTIAL CODE.D/C PLANS RETURN HOME W/[facility]UMPT[facility] SERVICES.  07/27/14 Krish Bailly RN,BSN NCM [phone] TRANSFER FROM SDU.ACTIVE [place] Rosana Hoes, RN, BSN, CCM Chart reviewed. Discharge needs and patient's stay to be reviewed and followed by case manager. chart note:  is a 80 y.o. female with past medical history of A. Fib (not on anticoagulant),  CHF, hyperlipidemia, hypertension, hypothyroidism, colon cancer (s/p of partial colectomy on 07/06/14), who presents with tachycardia, shortness of breath and altered mental status.  is now under hospice care. She was diagnosed with A. Fib and started with Digoxin and metoprolol. She has been compliant to her medications. On 10/30, the hospice nurse noticed that patient's heart rate has increased.  At one point, her heart rate reached about 170/min.

## 2014-07-26 NOTE — Progress Notes (Addendum)
RN received patient from [place].  Patient does not speak [place] but is pleasant and cooperative. Daughter came with patient, she speaks [place].

## 2014-07-26 NOTE — Progress Notes (Signed)
PT Cancellation Note  Patient Details Name: Kirsten Golden MRN: [id number] DOB: 25-Oct-1923   Cancelled Treatment:    Reason Eval/Treat Not Completed: Patient at procedure or test/unavailable (to CT)   [name],[name] 07/26/2014, 10:10 AM Carmelia Bake, PT, DPT 07/26/2014 Pager: [phone]

## 2014-07-26 NOTE — Progress Notes (Signed)
Inpatient RN visit- Kirsten Golden [facility] RN Visit-[name] [place] RN  Related admission to [facility] diagnosis of  Metastatic Renal Ca.  Pt is DNR code.  OOF DNR in place in patient's home.  Pt admitted through ED on 11/4 after interventions in the patients home were ineffective in controlling her symptoms of increased work of breathing and rapid heart rate. Pt seen at bedside, sitting up in bed, alert and interactive.  Daughter [name] present and interpreted through out visit as pt is non english speaking.  [name] assisted patient to drink contrast liquid for ordered abd CT. Pt noted to burp after each swallow. Per [name], CT ordered in attempt to find the source of low Hb. Pt denied pain. Increased heart rate (90-120's) and increased BP (171/100) seen on monitor in pt room during visit. Pt did not appear short of breath.   Per chart review and discussion with staff RN Ene pt  recvd a po dose of metoprolol 100 mg this am. She also recvd 1 unit of blood overnight for an admission Hb of 7.1 and 3% NaCl IV solution for low sodium of 119.  [name] to accompany pt to CT, transport in at end of visit. [facility] will continue to follow.  Patient's home medication list and transfer summary in place on shadow chart.  Please call [facility] @ [phone] with any hospice needs.   Thank you. Tracey Harries, RN  [facility]  Hospice Liaison  [phone])

## 2014-07-26 NOTE — Progress Notes (Signed)
Transfer order received for med surg. Bed assignment received. Bedside RN told daughter room number.  Daughter refused to transfer if room was not big.Care Coordinator explained 2 rooms available and room was moderate size. Bedside RN started to transfer pt and daughter stated she wanted her back on heart drip. Her heart rate was too high. In conversation daughter also stated she was clear with hospice that she did not want compressions or tube but wanted everything else. Dr Rama paged. Orders received. Daughter then wanted PCP notified and updated on all from this admission and her Primary Cardiologist called in. Tylene Fantasia called and updated. Explained to daughter that her mother's physicians did not have privileges here but they could view in EPIC or they would get a follow up from hospital admission. She wanted Hospitalist to call PCP and Cardiologist to update them.

## 2014-07-26 NOTE — Progress Notes (Signed)
Called midlevel to inform pt received her po metoprolol and b/p down. Pt with blood products going. Midlevel stated ok no new interventions at this time. Will continue to monitor.

## 2014-07-26 NOTE — Plan of Care (Signed)
Problem: Phase I Progression Outcomes Goal: Heart rate or rhythm control medication Outcome: Progressing

## 2014-07-26 NOTE — Evaluation (Signed)
Clinical/Bedside Swallow Evaluation Patient Details  Name: Kirsten Golden MRN: [id number] Date of Birth: May 14, 1924  Today's Date: 07/26/2014 Time: 0950-1015 SLP Time Calculation (min): 25 min  Past Medical History:  Past Medical History  Diagnosis Date  . Colon cancer   . Allergy   . Constipation   . Frequent urination   . Hypothyroidism   . Vitamin D deficiency   . Hyperparathyroidism   . Hypertension   . Pure hypercholesterolemia   . Osteoporosis   . Chronic renal insufficiency   . Atrial fibrillation   . CHF (congestive heart failure)   . Lung nodule    Past Surgical History:  Past Surgical History  Procedure Laterality Date  . Laparoscopic partial colectomy N/A 07/06/2014    Procedure: LAPAROSCOPIC ASSISTED TRANSVERSE COLECTOMY;  Surgeon: Pedro Earls, MD;  Location: [facility];  Service: General;  Laterality: N/A;  . Tumor surgery     HPI:  80 yo female adm to [facility] with SOB and elevation HR.  Pt with PMH of partial trasverse colectomy on 10/16.  Swallow evaluation ordered upon admit.  CXR showed CHF 11/4.  Pt has been under hospice care since 07/16/14.  WBC, BUN and sodium elevated.  Daughter present during evaluation who speaks [place] and [place] and reports pt with frequent belching.     Assessment / Plan / Recommendation Clinical Impression  Pt presents with mildly delay in oral transiting and possible pharyngeal swallow triggering delay but no s/s of aspiration.  Primary swallowing deficits is belching immediately post swallow after each bolus of contrast.  Daughter present reports this occurs at home since pt's partial colectomy surgery 07/06/14.  She further states pt will occasionally cough and expectorate when belching.  Daughter reports pt will occasionally choke on foods.    Fatigue and dyspnea noted during pt intake of contrast today.  SLP follow up indicated due to pt occasionally choking on foods and dyspnea increasing her aspiration risk.     Educated  daughter and pt to aspiration precautions and assuring pt is allowed frequent rest breaks if she comes short of breath.  Will follow up x1 to assure tolerance and for family education to maximize intake/comfort for pt with intake.     Aspiration Risk  Moderate    Diet Recommendation Dysphagia 3 (Mechanical Soft);Thin liquid (when md clears for po diet)   Liquid Administration via: Cup;Straw Medication Administration: Whole meds with liquid Supervision: Full supervision/cueing for compensatory strategies Compensations: Small sips/bites;Slow rate Postural Changes and/or Swallow Maneuvers: Seated upright 90 degrees;Upright 30-60 min after meal    Other  Recommendations Oral Care Recommendations: Oral care BID   Follow Up Recommendations    n/a   Frequency and Duration min 1 x/week  1 week   Pertinent Vitals/Pain Afebrile,decreased      Swallow Study Prior Functional Status   see hhx     General Date of Onset: 07/26/14 HPI: 80 yo female adm to [facility] with SOB and elevation HR.  Pt with PMH of partial trasverse colectomy on 10/16.  Swallow evaluation ordered upon admit.  CXR showed CHF 11/4.  Pt has been under hospice care since 07/16/14.  WBC, BUN and sodium elevated.  Daughter present during evaluation who speaks [place] and [place] and reports pt with frequent belching.   Type of Study: Bedside swallow evaluation Diet Prior to this Study: NPO (contrast agent being consumed for CT scan of abdomen) Temperature Spikes Noted: No Respiratory Status: Room air History of Recent Intubation: No Behavior/Cognition: Alert;Cooperative;Pleasant  mood Oral Cavity - Dentition: Dentures, top;Dentures, bottom (dentures not in place due to pt's npo status and consumption of contrast agent) Self-Feeding Abilities: Needs assist Patient Positioning: Upright in bed Baseline Vocal Quality: Other (comment) (daughter reports voice to be higher pitched currently) Volitional Cough: Weak Volitional Swallow:  Able to elicit    Oral/Motor/Sensory Function Overall Oral Motor/Sensory Function: Appears within functional limits for tasks assessed (? minimal decreased movement of left facial movement, generalized weakness but no focal CN deficits)   Ice Chips Ice chips: Not tested   Thin Liquid Thin Liquid: Impaired Presentation: Cup;Straw Oral Phase Impairments: Impaired anterior to posterior transit Oral Phase Functional Implications: Prolonged oral transit Pharyngeal  Phase Impairments: Suspected delayed Swallow Other Comments: uncertain of portion of delay is due to pt's displeasure of CT contrast    Nectar Thick Nectar Thick Liquid: Not tested   Honey Thick Honey Thick Liquid: Not tested   Puree Puree: Not tested   Solid   GO    Solid: Not tested       Luanna Salk, [place] [facility] SLP [phone]

## 2014-07-26 NOTE — Progress Notes (Signed)
Progress Note   Kirsten Golden LNL:[id number] DOB: 1924-05-14 DOA: 07/25/2014 PCP: Casilda Carls, MD   Brief Narrative:   Kirsten Golden is an 80 y.o. female with a PMH of atrial fibrillation (not on anti-coagulation, CHF, HTN, hyperlipidemia, hypothyroidism, colon cancer status post partial colectomy 07/06/14 who was admitted 07/25/14 with a chief complaint of shortness of breath and altered mental status. She is under hospice care at home, and her hospice nurse directed her to the ER secondary to tachycardia.  Upon initial evaluation in the ER, the patient was noted to have a drop in her baseline hemoglobin from 8.2 on 07/14/14, to 7.4. She was also found to have a WBC of 11.6, a sodium of 119, and a chest x-ray concerning for congestive heart failure. VQ scan showed low probability of pulmonary embolism.  Assessment/Plan:   Principal Problem:   Atrial fibrillation with rapid ventricular response  The patient was admitted to the SDU and placed on a Cardizem drip.  HR 120's.  Home medications including metoprolol and digoxin were continued.  Digoxin level was low at 0.6.  Troponins were negative 3 sets.  TSH is pending.  Active Problems:   Anemia / GIB (gastrointestinal bleeding)  The patient's anemia is felt to be from chronic GI blood loss given her history of colon cancer.  Status post 1 unit of PRBCs.    Colon cancer / lung nodule  Hospice care.    Hypothyroidism  Follow-up TSH.    Hypertension  Blood pressure currently stable.    CHF (congestive heart failure)  Status post 2-D echocardiogram 07/19/14, EF 55-60 percent.  Diuretics held on admission.    Encephalopathy  Likely secondary to metabolic causes with hyponatremia and rapid A. fib.    Hyponatremia  Baseline sodium 1:30 on 07/10/14.  Treated with 3% sodium chloride for 6 hours, sodium level 118---> 121.  Diuretics currently on hold.  Follow-up urine osmolality, serum  osmolality and urine sodium levels.    DVT Prophylaxis  Code Status: DNR Family Communication: Multiple family updated at bedside. Disposition Plan: Home when stable.   IV Access:    Peripheral IV   Procedures and diagnostic studies:   Dg Chest 2 View 07/25/2014: Increased pleural fluid collections bilaterally as well as increased pulmonary vascularity are most compatible with congestive heart failure.     Nm Pulmonary Perf And Vent 07/25/2014: There is a matching ventilation perfusion defect in the lateral right base which corresponds to an area of consolidation on the chest radiograph. This finding is in a nonsegmental distribution. There are no appreciable ventilation/perfusion mismatches. This study over all constitutes a low probability of pulmonary embolus.      Medical Consultants:    None.  Anti-Infectives:    None.  Subjective:   Kirsten Golden is angry at her family for bringing her to the hospital.  At 1st, she refused medications, but took them with much encouragement.  She denies dyspnea/chest pain.    Objective:    Filed Vitals:   07/26/14 0515 07/26/14 0530 07/26/14 0545 07/26/14 0600  BP: 125/89 130/86 130/85 126/100  Pulse: 88 124 121 70  Temp:      TempSrc:      Resp: 22 20 22 15   Height:      Weight:      SpO2: 93% 94% 92% 94%    Intake/Output Summary (Last 24 hours) at 07/26/14 0656 Last data filed at 07/26/14 0600  Gross per 24 hour  Intake   1432  ml  Output    900 ml  Net    532 ml    Exam: Gen:  NAD, awake/alert Cardiovascular:  Tachy, HSIR Respiratory:  Lungs CTAB Gastrointestinal:  Abdomen soft, NT/ND, + BS Extremities:  No C/E/C   Data Reviewed:    Labs: Basic Metabolic Panel:  Recent Labs Lab 07/25/14 1633 07/25/14 2316 07/26/14 0420  NA 119* 118* 121*  K 4.9 4.5 4.9  CL 82* 83* 87*  CO2 21 23 19   GLUCOSE 151* 209* 126*  BUN 38* 35* 36*  CREATININE 1.05 0.96 1.02  CALCIUM 9.8 9.4 9.2  MG  --  1.9  --    PHOS  --  2.4  --    GFR Estimated Creatinine Clearance: 26.7 mL/min (by C-G formula based on Cr of 1.02). Liver Function Tests:  Recent Labs Lab 07/25/14 1633 07/26/14 0420  AST 60* 57*  ALT 183* 155*  ALKPHOS 238* 216*  BILITOT 0.3 0.8  PROT 5.8* 5.3*  ALBUMIN 2.8* 2.6*   Coagulation profile  Recent Labs Lab 07/25/14 2316  INR 1.26    CBC:  Recent Labs Lab 07/25/14 1633 07/25/14 2316 07/26/14 0420  WBC 11.6* 10.9* 13.2*  NEUTROABS 9.8*  --   --   HGB 7.4* 7.1* 8.9*  HCT 22.3* 21.4* 26.7*  MCV 71.0* 70.4* 73.2*  PLT 371 357 363   Cardiac Enzymes:  Recent Labs Lab 07/25/14 1633 07/25/14 2316 07/26/14 0420  TROPONINI <0.30 <0.30 <0.30   BNP (last 3 results)  Recent Labs  07/26/14 0420  PROBNP 18005.0*   Sepsis Labs:  Recent Labs Lab 07/25/14 1633 07/25/14 2316 07/26/14 0420  WBC 11.6* 10.9* 13.2*   Microbiology Recent Results (from the past 240 hour(s))  MRSA PCR Screening     Status: None   Collection Time: 07/25/14 10:56 PM  Result Value Ref Range Status   MRSA by PCR NEGATIVE NEGATIVE Final    Comment:        The GeneXpert MRSA Assay (FDA approved for NASAL specimens only), is one component of a comprehensive MRSA colonization surveillance program. It is not intended to diagnose MRSA infection nor to guide or monitor treatment for MRSA infections.      Medications:   . acetaminophen  500 mg Oral QID  . aspirin EC  81 mg Oral Daily  . digoxin  0.125 mg Oral Daily  . ferrous fumarate  1 tablet Oral Daily  . hydrocortisone   Rectal TID  . hydrocortisone  25 mg Rectal BID  . levothyroxine  50 mcg Oral QAC breakfast  . metoprolol  100 mg Oral BID  . pantoprazole  40 mg Oral Daily  . senna-docusate  1 tablet Oral BID  . sodium chloride  3 mL Intravenous Q12H   Continuous Infusions: . diltiazem (CARDIZEM) infusion Stopped (07/26/14 0059)  . sodium chloride (hypertonic) 50 mL/hr (07/26/14 0117)    Time spent: 40  minutes.   LOS: 1 day   RAMA,CHRISTINA  [facility] Pager [phone]. If unable to reach me by pager, please call my cell phone at [phone].  *Please refer to [email or web address], password [email or web address] to get updated schedule on who will round on this patient, as hospitalists switch teams weekly. If 7PM-7AM, please contact night-coverage at [email or web address], password [email or web address] for any overnight needs.  07/26/2014, 6:56 AM

## 2014-07-26 NOTE — Progress Notes (Signed)
[facility] MSW note:  Pt is a current hospice home care patient. This is a related hospice admission. Pt is a DNR. Pt lives with Daughter, son in law, and spouse. Pt does not speak [place]. Pt speaks Farsi. Daughter-[name] is the interpreter for pt and spouse. Daughter not present on visit. MSW did speak with daughter by phone during visit to discuss pt's status and discuss hospital discharge plans. Daughter plans for pt to return to her home with continued hospice services. MSW discussed case with Rosezella [place], staff RN. Staff RN proceded to give bladder scan during visit. Pt reported that she was ok with hospitalization as pt's symptoms were not being managed at home.   Marilynne Halsted, MSW

## 2014-07-26 NOTE — Progress Notes (Addendum)
   07/26/14 1900  Vitals  BP (!) 152/101 mmHg  MAP (mmHg) 117  Pulse Rate (!) 105  ECG Heart Rate (!) 112  Resp (!) 22   Dr. Rockne Menghini made aware of pt. heart rate and blood pressure. Order given for cardizem PO 120mg  BID, metoprolol 2.5mg  IV q6hr prn for heart rate >115, and change transfer bed status to telemetry. Charge nurse made aware of patient family request for bed change and pt. Cardiologist be notified of patient admission in hospital.

## 2014-07-26 NOTE — Plan of Care (Signed)
Problem: Phase I Progression Outcomes Goal: Ventricular heart rate < 120/min Outcome: Progressing

## 2014-07-26 NOTE — Progress Notes (Signed)
INITIAL NUTRITION ASSESSMENT  DOCUMENTATION CODES Per approved criteria  -Not Applicable   INTERVENTION: -Recommend Ensure Complete po BID PRN, each supplement provides 350 kcal and 13 grams of protein -RD to continue to monitor  NUTRITION DIAGNOSIS: Increased nutrient needs related chronic disease to as evidenced by metastatic renal cancer.   Goal: Pt to meet >/= 90% of their estimated nutrition needs    Monitor:  Total protein/energy intake, labs, weights, swallow profile  Reason for Assessment: MST  80 y.o. female  Admitting Dx: Atrial fibrillation with rapid ventricular response  ASSESSMENT: Kirsten Golden is a 80 y.o. female with past medical history of A. Fib (not on anticoagulant), CHF, hyperlipidemia, hypertension, hypothyroidism, colon cancer (s/p of partial colectomy on 07/06/14), who presents with tachycardia, shortness of breath and altered mental status.  -Pt non-English speaking, unable to provide food/nutrition hx. Pt was assessed by RD in 07/09/2014, reported usual body weight >110 lbs and had fair appetite pta. Supplemented with [employer] while on clear liquid diet. Was advanced to Dys2 diet with 100% PO intake per RN documentation. -Per MST, pt with poor PO intake for past 2-3 days likely d/t increased confusion. -SLP evaluation on 11/05 recommended Dys3. Will order Ensure BID for additional nutrients  -Pt is under hospice care and no longer receiving active care for colon cancer/lung nodule  Height: Ht Readings from Last 1 Encounters:  07/25/14 4\' 9"  (1.448 m)    Weight: Wt Readings from Last 1 Encounters:  07/26/14 116 lb 13.5 oz (53 kg)    Ideal Body Weight: 92.5 lb  % Ideal Body Weight: 125%  Wt Readings from Last 10 Encounters:  07/26/14 116 lb 13.5 oz (53 kg)  07/20/14 110 lb 12 oz (50.236 kg)  07/04/14 104 lb 11.5 oz (47.5 kg)    Usual Body Weight: 110 lb  % Usual Body Weight: 100%  BMI:  Body mass index is 25.28  kg/(m^2).  Estimated Nutritional Needs: Kcal: 1400-1600 Protein: 65-80 gram Fluid: per MD  Skin: WDL  Diet Order: DIET DYS 3  EDUCATION NEEDS: -No education needs identified at this time   Intake/Output Summary (Last 24 hours) at 07/26/14 1252 Last data filed at 07/26/14 0600  Gross per 24 hour  Intake   1432 ml  Output    900 ml  Net    532 ml    Last BM: 11/05   Labs:   Recent Labs Lab 07/25/14 1633 07/25/14 2316 07/26/14 0420  NA 119* 118* 121*  K 4.9 4.5 4.9  CL 82* 83* 87*  CO2 21 23 19   BUN 38* 35* 36*  CREATININE 1.05 0.96 1.02  CALCIUM 9.8 9.4 9.2  MG  --  1.9  --   PHOS  --  2.4  --   GLUCOSE 151* 209* 126*    CBG (last 3)   Recent Labs  07/26/14 0732  GLUCAP 120*    Scheduled Meds: . acetaminophen  500 mg Oral QID  . aspirin EC  81 mg Oral Daily  . digoxin  0.125 mg Oral Daily  . ferrous fumarate  1 tablet Oral Daily  . hydrocortisone   Rectal TID  . hydrocortisone  25 mg Rectal BID  . levothyroxine  50 mcg Oral QAC breakfast  . metoprolol  100 mg Oral BID  . pantoprazole  40 mg Oral Daily  . senna-docusate  1 tablet Oral BID  . sodium chloride  3 mL Intravenous Q12H    Continuous Infusions: . sodium chloride 50  mL/hr at 07/26/14 0840  . diltiazem (CARDIZEM) infusion Stopped (07/26/14 0059)    Past Medical History  Diagnosis Date  . Colon cancer   . Allergy   . Constipation   . Frequent urination   . Hypothyroidism   . Vitamin D deficiency   . Hyperparathyroidism   . Hypertension   . Pure hypercholesterolemia   . Osteoporosis   . Chronic renal insufficiency   . Atrial fibrillation   . CHF (congestive heart failure)   . Lung nodule     Past Surgical History  Procedure Laterality Date  . Laparoscopic partial colectomy N/A 07/06/2014    Procedure: LAPAROSCOPIC ASSISTED TRANSVERSE COLECTOMY;  Surgeon: Pedro Earls, MD;  Location: [facility];  Service: General;  Laterality: N/A;  . Tumor surgery      Atlee Abide  [place] LDN Clinical Dietitian [phone]

## 2014-07-26 NOTE — Progress Notes (Signed)
Pt with sodium of 118 awaiting midlevel to call back about blood products

## 2014-07-26 NOTE — Progress Notes (Signed)
Pt type and screen up called midlevel to get order to transfuse blood and also pt taken po meds but has order for npo. Awaiting call back.

## 2014-07-27 ENCOUNTER — Telehealth: Payer: Self-pay

## 2014-07-27 DIAGNOSIS — I5031 Acute diastolic (congestive) heart failure: Secondary | ICD-10-CM

## 2014-07-27 DIAGNOSIS — I4891 Unspecified atrial fibrillation: Principal | ICD-10-CM

## 2014-07-27 LAB — BLOOD GAS, ARTERIAL
Acid-Base Excess: 0.5 mmol/L (ref 0.0–2.0)
Bicarbonate: 23.8 mEq/L (ref 20.0–24.0)
DRAWN BY: 308601
O2 CONTENT: 2 L/min
O2 Saturation: 95.5 %
PCO2 ART: 35 mmHg (ref 35.0–45.0)
PH ART: 7.448 (ref 7.350–7.450)
Patient temperature: 98.6
TCO2: 22.1 mmol/L (ref 0–100)
pO2, Arterial: 87.2 mmHg (ref 80.0–100.0)

## 2014-07-27 LAB — BASIC METABOLIC PANEL
ANION GAP: 12 (ref 5–15)
BUN: 31 mg/dL — ABNORMAL HIGH (ref 6–23)
CO2: 23 meq/L (ref 19–32)
Calcium: 9.4 mg/dL (ref 8.4–10.5)
Chloride: 85 mEq/L — ABNORMAL LOW (ref 96–112)
Creatinine, Ser: 0.99 mg/dL (ref 0.50–1.10)
GFR calc non Af Amer: 49 mL/min — ABNORMAL LOW (ref >90)
GFR, EST AFRICAN AMERICAN: 57 mL/min — AB (ref >90)
Glucose, Bld: 108 mg/dL — ABNORMAL HIGH (ref 70–99)
POTASSIUM: 3.7 meq/L (ref 3.7–5.3)
Sodium: 120 mEq/L — CL (ref 137–147)

## 2014-07-27 LAB — CBC
HEMATOCRIT: 25.9 % — AB (ref 36.0–46.0)
HEMOGLOBIN: 8.8 g/dL — AB (ref 12.0–15.0)
MCH: 24.6 pg — ABNORMAL LOW (ref 26.0–34.0)
MCHC: 34 g/dL (ref 30.0–36.0)
MCV: 72.3 fL — ABNORMAL LOW (ref 78.0–100.0)
Platelets: 339 10*3/uL (ref 150–400)
RBC: 3.58 MIL/uL — AB (ref 3.87–5.11)
RDW: 22.6 % — ABNORMAL HIGH (ref 11.5–15.5)
WBC: 13 10*3/uL — ABNORMAL HIGH (ref 4.0–10.5)

## 2014-07-27 LAB — TYPE AND SCREEN
ABO/RH(D): O POS
ANTIBODY SCREEN: NEGATIVE
UNIT DIVISION: 0

## 2014-07-27 LAB — GLUCOSE, CAPILLARY: GLUCOSE-CAPILLARY: 98 mg/dL (ref 70–99)

## 2014-07-27 MED ORDER — FUROSEMIDE 20 MG PO TABS
20.0000 mg | ORAL_TABLET | Freq: Every day | ORAL | Status: DC
  Administered 2014-07-27: 20 mg via ORAL
  Filled 2014-07-27: qty 1

## 2014-07-27 MED ORDER — DEXTROSE 5 % IV SOLN: INTRAVENOUS | Status: DC

## 2014-07-27 MED ORDER — DILTIAZEM HCL 30 MG PO TABS
30.0000 mg | ORAL_TABLET | Freq: Four times a day (QID) | ORAL | Status: DC
  Administered 2014-07-27 – 2014-07-29 (×6): 30 mg via ORAL
  Filled 2014-07-27 (×6): qty 1

## 2014-07-27 NOTE — Progress Notes (Signed)
Inpatient RN visit- [place] 4E Room 1409-[facility] RN Visit-[name] [place] RN  Related admission to [facility] diagnosis of Metastatic Renal Ca.  Pt is DNR code.     Pt seen at bedside, lying in bed, husband present. Daughter [name] not present. Pt and her husband are non [place] speaking. Pt appears tired, some audible upper airway secretions noted. Discussed with staff RN Raquel Sarna who reports fine rales heard in upper right lobe in her assessment this am. Raquel Sarna also reported that pt did not sleep well last night. Per chart review pt was treated yesterday evening for increased HR and BP, no PO intake charted, but has been taking po medications.  Writer had conversation with Attending Physician Dr. Charlies Silvers to advise that [facility] had attempted to control increased HR at home and were unable to do so. Pt's daughter had wanted pt to come to the hospital for treatment. Discussion of Goals of Care meeting, writer advised Attending that pt had [place] on last hospitalization with Dr. Hilma Favors.  [facility] SW Marilynne Halsted updated and felt that family may benefit from another meeting with PMT. [facility] will continue to follow.  Patient's home medication list and transfer summary in place on shadow chart.  Please call [facility] @ [phone]- with any hospice needs.   Thank you. Tracey Harries, RN  [facility]  Hospice Liaison  [phone])

## 2014-07-27 NOTE — Plan of Care (Signed)
Problem: Phase I Progression Outcomes Goal: Ventricular heart rate < 120/min Outcome: Completed/Met Date Met:  07/27/14

## 2014-07-27 NOTE — Progress Notes (Signed)
RN changed the batteries and the monitor is back on. Heart rate 65.

## 2014-07-27 NOTE — Progress Notes (Signed)
CRITICAL VALUE ALERT  Critical value received:  Sodium 120  Date of notification:  07/27/14  Time of notification:  0523  Critical value read back:yes  Nurse who received alert:  Dellie Catholic  MD notified (1st page):  Yes  Time of first page:  [phone]

## 2014-07-27 NOTE — Progress Notes (Signed)
CMT alerted RN that patient is now Kirsten Golden. Last vitals were: 98.14F,HR 55,RR 14.B/P 90/70 98% on 2 Liters oxygen. Patient had a non-sustaining HR of 38. PCP on call was notified. Will continue to monitor the patient.

## 2014-07-27 NOTE — Plan of Care (Signed)
Problem: Phase II Progression Outcomes Goal: Tolerating diet Outcome: Completed/Met Date Met:  07/27/14     

## 2014-07-27 NOTE — Progress Notes (Signed)
Speech Language Pathology Treatment: Dysphagia  Patient Details Name: Kirsten Golden MRN: [id number] DOB: 02/17/1924 Today's Date: 07/27/2014 Time: 7680-8811 SLP Time Calculation (min): 17 min  Assessment / Plan / Recommendation Clinical Impression  Educated family and pt to aspiration precautions, assuring pt clears oral cavity before giving her more intake as observed family feeding without oral clearance. Encouraged pt self feed to maximize airway protection and take frequent rest breaks if short of breath or coughing.  Further  advised strict esophageal/aspiration precautions.  No belching noted during meal today, however pt did state desire to stand up during meal - ? if to help for esophageal clearance given pt's kyphosis.  Voice is better today with decreased hoarseness.  Suspect pt's respiratory status will be her primary aspiration risk and all education completed to mitigate risk.  Family satisfied with current diet at this time and comfortable with precautions.  Will sign off.     HPI HPI: 80 yo female adm to [facility] with SOB and elevation HR.  Pt with PMH of partial trasverse colectomy on 10/16.  Swallow evaluation ordered upon admit.  CXR showed CHF 11/4.  Pt has been under hospice care since 07/16/14.  WBC, BUN and sodium elevated.  Daughter present during evaluation who speaks [place] and [place] and reports pt with frequent belching.     Pertinent Vitals Pain Assessment: No/denies pain  SLP Plan  All goals met    Recommendations Diet recommendations: Dysphagia 3 (mechanical soft);Thin liquid Liquids provided via: Cup;Straw Medication Administration: Whole meds with liquid (use applesauce if problematic) Supervision: Full supervision/cueing for compensatory strategies;Patient able to self feed Compensations: Slow rate;Small sips/bites (rest breaks if short of breath or coughing) Postural Changes and/or Swallow Maneuvers: Seated upright 90 degrees;Upright 30-60 min after meal               Oral Care Recommendations: Oral care BID Follow up Recommendations: None Plan: All goals met    [place], [place] [facility] SLP [phone]

## 2014-07-27 NOTE — Progress Notes (Signed)
Patient was placed on standby earlier. Now RN telephoned CMT to have the patient taken off standby.

## 2014-07-27 NOTE — Evaluation (Signed)
Physical Therapy Evaluation Patient Details Name: Kirsten Golden MRN: [id number] DOB: 1924/01/27 Today's Date: 07/27/2014   History of Present Illness  80 yo female admitted with A fib with RVR, SOB, encephalopathy. Hx of met colon cancer, HTN, osteoporosis, colectomy 06/2014. Pt is not English-speaking. Daughter was present during session and assisted with translation.   Clinical Impression  On eval, pt was Min guard assist for mobility-able to ambulate ~150 feet with RW. Pt appeared generally fatigued during session. Daughter present during session and states plan is for return home     Follow Up Recommendations Supervision/Assistance - 24 hour (pt is from home with hospice)    Equipment Recommendations  None recommended by PT    Recommendations for Other Services       Precautions / Restrictions Precautions Precautions: Fall Restrictions Weight Bearing Restrictions: No      Mobility  Bed Mobility Overal bed mobility: Needs Assistance             General bed mobility comments: pt on BSC  Transfers Overall transfer level: Needs assistance Equipment used: Rolling walker (2 wheeled) Transfers: Sit to/from Stand Sit to Stand: Min guard         General transfer comment: close guard for safety  Ambulation/Gait Ambulation/Gait assistance: Min guard Ambulation Distance (Feet): 150 Feet Assistive device: Rolling walker (2 wheeled) Gait Pattern/deviations: Step-through pattern;Decreased stride length     General Gait Details: slow gait speed. close guard for safety  Stairs            Wheelchair Mobility    Modified Rankin (Stroke Patients Only)       Balance                                             Pertinent Vitals/Pain Pain Assessment: No/denies pain    Home Living Family/patient expects to be discharged to:: Private residence                      Prior Function                 Hand Dominance       Extremity/Trunk Assessment   Upper Extremity Assessment: Defer to OT evaluation           Lower Extremity Assessment: Generalized weakness      Cervical / Trunk Assessment: Kyphotic  Communication      Cognition Arousal/Alertness: Awake/alert Behavior During Therapy: WFL for tasks assessed/performed Overall Cognitive Status: Within Functional Limits for tasks assessed                      General Comments      Exercises        Assessment/Plan    PT Assessment Patient needs continued PT services  PT Diagnosis Difficulty walking;Generalized weakness   PT Problem List Decreased mobility;Decreased activity tolerance  PT Treatment Interventions DME instruction;Gait training;Functional mobility training;Therapeutic activities;Therapeutic exercise;Patient/family education   PT Goals (Current goals can be found in the Care Plan section) Acute Rehab PT Goals Patient Stated Goal: home per daughter PT Goal Formulation: With family Time For Goal Achievement: 08/10/14 Potential to Achieve Goals: Fair    Frequency Min 3X/week   Barriers to discharge        Co-evaluation               End of  Session   Activity Tolerance: Patient limited by fatigue Patient left: in chair;with call bell/phone within reach;with family/visitor present (with ST)           Time: 5300-5110 PT Time Calculation (min): 21 min   Charges:   PT Evaluation $Initial PT Evaluation Tier I: 1 Procedure     PT G Codes:          Weston Anna, MPT Pager: [phone]

## 2014-07-27 NOTE — Consult Note (Signed)
Primary cardiologist: Fletcher Anon  HPI: 80 year old female for evaluation of atrial fibrillation. Echocardiogram October 2015 showed normal LV function, mild aortic insufficiency, moderate mitral regurgitation, moderate right atrial enlargement, mild right ventricular enlargement and moderate to severe tricuspid regurgitation. Patient was recently discharged following admission for hemicolectomy. She has a renal mass suspicious for renal cell carcinoma and colon cancer with pulmonary nodules which are presumed metastatic disease. She had almost complete obstruction and underwent recent hemicolectomy. She is a no CODE BLUE and is on hospice. Patient was readmitted on November 4 with worsening mental status, tachycardia and dyspnea. No chest pain. Initial electrocardiogram showed atrial fibrillation with a rate of 122. Initial sodium 119 with BUN 38 and creatinine 1.05. Liver functions elevated. Hemoglobin 7.4. Patient's heart rate was initially controlled with metoprolol and digoxin. Given increased rate Cardizem was added. She now was noted to be bradycardic with heart rates in the 30s. Cardiology asked to evaluate.  Medications Prior to Admission  Medication Sig Dispense Refill  . aspirin EC 81 MG EC tablet Take 1 tablet (81 mg total) by mouth daily.    . ciprofloxacin (CIPRO) 500 MG tablet Take 500 mg by mouth 2 (two) times daily. For 7 days. 11-4 is day 7    . diclofenac sodium (VOLTAREN) 1 % GEL Apply 2 g topically 4 (four) times daily.    . digoxin (LANOXIN) 0.125 MG tablet Take 0.125 mg by mouth daily.    . ferrous fumarate (HEMOCYTE - 106 MG FE) 325 (106 FE) MG TABS tablet Take 1 tablet by mouth.    . furosemide (LASIX) 20 MG tablet Take 1 tablet (20 mg total) by mouth daily. 30 tablet 1  . haloperidol (HALDOL) 2 MG/ML solution Take 2 mg by mouth every 4 (four) hours as needed for agitation.    Marland Kitchen HYDROcodone-acetaminophen (HYCET) 7.5-325 mg/15 ml solution Take 15 mLs by mouth every 6 (six) hours  as needed for moderate pain. 45 mL 0  . hydrocortisone (ANUSOL-HC) 2.5 % rectal cream Place rectally 3 (three) times daily. 30 g 0  . hydrocortisone (ANUSOL-HC) 25 MG suppository Place 1 suppository (25 mg total) rectally 2 (two) times daily. 12 suppository 0  . levothyroxine (SYNTHROID, LEVOTHROID) 50 MCG tablet Take 50 mcg by mouth daily before breakfast.    . metoprolol (LOPRESSOR) 100 MG tablet Take 1 tablet (100 mg total) by mouth 2 (two) times daily. 60 tablet 6  . pantoprazole (PROTONIX) 40 MG tablet Take 1 tablet (40 mg total) by mouth daily. 30 tablet 1  . polyvinyl alcohol (LIQUIFILM TEARS) 1.4 % ophthalmic solution Place 1 drop into both eyes as needed for dry eyes.    Marland Kitchen senna-docusate (SENOKOT-S) 8.6-50 MG per tablet Take 1 tablet by mouth 2 (two) times daily. 60 tablet 1  . acetaminophen (TYLENOL) 500 MG tablet Take 1 tablet (500 mg total) by mouth 4 (four) times daily.    Marland Kitchen LORazepam (ATIVAN) 0.5 MG tablet Take 1 tablet (0.5 mg total) by mouth at bedtime as needed for sleep. 30 tablet 0  . triamterene-hydrochlorothiazide (DYAZIDE) 50-25 MG per capsule Take 1 capsule by mouth daily.      No Known Allergies  Past Medical History  Diagnosis Date  . Colon cancer   . Allergy   . Constipation   . Frequent urination   . Hypothyroidism   . Vitamin D deficiency   . Hyperparathyroidism   . Hypertension   . Pure hypercholesterolemia   . Osteoporosis   . Chronic  renal insufficiency   . Atrial fibrillation   . CHF (congestive heart failure)   . Lung nodule     Past Surgical History  Procedure Laterality Date  . Laparoscopic partial colectomy N/A 07/06/2014    Procedure: LAPAROSCOPIC ASSISTED TRANSVERSE COLECTOMY;  Surgeon: Pedro Earls, MD;  Location: [facility];  Service: General;  Laterality: N/A;  . Tumor surgery      History   Social History  . Marital Status: Widowed    Spouse Name: N/A    Number of Children: N/A  . Years of Education: N/A   Occupational History    . Not on file.   Social History Main Topics  . Smoking status: Never Smoker   . Smokeless tobacco: Not on file  . Alcohol Use: No  . Drug Use: No  . Sexual Activity: Not on file   Other Topics Concern  . Not on file   Social History Narrative    Family History  Problem Relation Age of Onset  . Family history unknown: Yes    ROS:  Difficult due to obtain because of language barrier. There is dyspnea and mild abdominal pain. Remaining systems negative.  Physical Exam:   Blood pressure 110/74, pulse 85, temperature 98.1 F (36.7 C), temperature source Oral, resp. rate 18, height 4' 9"  (1.448 m), weight 120 lb 13 oz (54.8 kg), SpO2 98 %.  General:  Well developed/frail in NAD Skin warm/dry Patient not depressed No peripheral clubbing Back-normal HEENT-normal/normal eyelids Neck supple/normal carotid upstroke bilaterally; no bruits; positive JVD; no thyromegaly chest - diminished BS bases CV - irregular/normal S1 and S2; no murmurs, rubs or gallops;  PMI nondisplaced Abdomen -s/p abd surgery; mild tenderness, ND, no HSM, no mass, + bowel sounds, no bruit 2+ femoral pulses, no bruits Ext-no edema, chords, 2+ DP Neuro-grossly nonfocal  ECG Atrial fibrillation with a rapid ventricular response.  Results for orders placed or performed during the hospital encounter of 07/25/14 (from the past 48 hour(s))  CBC with Differential     Status: Abnormal   Collection Time: 07/25/14  4:33 PM  Result Value Ref Range   WBC 11.6 (H) 4.0 - 10.5 K/uL   RBC 3.14 (L) 3.87 - 5.11 MIL/uL   Hemoglobin 7.4 (L) 12.0 - 15.0 g/dL   HCT 22.3 (L) 36.0 - 46.0 %   MCV 71.0 (L) 78.0 - 100.0 fL   MCH 23.6 (L) 26.0 - 34.0 pg   MCHC 33.2 30.0 - 36.0 g/dL   RDW 22.2 (H) 11.5 - 15.5 %   Platelets 371 150 - 400 K/uL   Neutrophils Relative % 84 (H) 43 - 77 %   Lymphocytes Relative 8 (L) 12 - 46 %   Monocytes Relative 8 3 - 12 %   Eosinophils Relative 0 0 - 5 %   Basophils Relative 0 0 - 1 %    Neutro Abs 9.8 (H) 1.7 - 7.7 K/uL   Lymphs Abs 0.9 0.7 - 4.0 K/uL   Monocytes Absolute 0.9 0.1 - 1.0 K/uL   Eosinophils Absolute 0.0 0.0 - 0.7 K/uL   Basophils Absolute 0.0 0.0 - 0.1 K/uL   RBC Morphology POLYCHROMASIA PRESENT   Comprehensive metabolic panel     Status: Abnormal   Collection Time: 07/25/14  4:33 PM  Result Value Ref Range   Sodium 119 (LL) 137 - 147 mEq/L    Comment: REPEATED TO VERIFY CRITICAL RESULT CALLED TO, READ BACK BY AND VERIFIED WITH: TIM SMTIH RN 1758 06/24/2014 BY  BOVELL,T.    Potassium 4.9 3.7 - 5.3 mEq/L   Chloride 82 (L) 96 - 112 mEq/L   CO2 21 19 - 32 mEq/L   Glucose, Bld 151 (H) 70 - 99 mg/dL   BUN 38 (H) 6 - 23 mg/dL   Creatinine, Ser 1.05 0.50 - 1.10 mg/dL   Calcium 9.8 8.4 - 10.5 mg/dL   Total Protein 5.8 (L) 6.0 - 8.3 g/dL   Albumin 2.8 (L) 3.5 - 5.2 g/dL   AST 60 (H) 0 - 37 U/L   ALT 183 (H) 0 - 35 U/L   Alkaline Phosphatase 238 (H) 39 - 117 U/L   Total Bilirubin 0.3 0.3 - 1.2 mg/dL   GFR calc non Af Amer 46 (L) >90 mL/min   GFR calc Af Amer 53 (L) >90 mL/min    Comment: (NOTE) The eGFR has been calculated using the CKD EPI equation. This calculation has not been validated in all clinical situations. eGFR's persistently <90 mL/min signify possible Chronic Kidney Disease.    Anion gap 16 (H) 5 - 15  Troponin I     Status: None   Collection Time: 07/25/14  4:33 PM  Result Value Ref Range   Troponin I <0.30 <0.30 ng/mL    Comment:        Due to the release kinetics of cTnI, a negative result within the first hours of the onset of symptoms does not rule out myocardial infarction with certainty. If myocardial infarction is still suspected, repeat the test at appropriate intervals.   Urinalysis, Routine w reflex microscopic     Status: None   Collection Time: 07/25/14  8:55 PM  Result Value Ref Range   Color, Urine YELLOW YELLOW   APPearance CLEAR CLEAR   Specific Gravity, Urine 1.011 1.005 - 1.030   pH 7.0 5.0 - 8.0   Glucose, UA  NEGATIVE NEGATIVE mg/dL   Hgb urine dipstick NEGATIVE NEGATIVE   Bilirubin Urine NEGATIVE NEGATIVE   Ketones, ur NEGATIVE NEGATIVE mg/dL   Protein, ur NEGATIVE NEGATIVE mg/dL   Urobilinogen, UA 0.2 0.0 - 1.0 mg/dL   Nitrite NEGATIVE NEGATIVE   Leukocytes, UA NEGATIVE NEGATIVE    Comment: MICROSCOPIC NOT DONE ON URINES WITH NEGATIVE PROTEIN, BLOOD, LEUKOCYTES, NITRITE, OR GLUCOSE <1000 mg/dL.  MRSA PCR Screening     Status: None   Collection Time: 07/25/14 10:56 PM  Result Value Ref Range   MRSA by PCR NEGATIVE NEGATIVE    Comment:        The GeneXpert MRSA Assay (FDA approved for NASAL specimens only), is one component of a comprehensive MRSA colonization surveillance program. It is not intended to diagnose MRSA infection nor to guide or monitor treatment for MRSA infections.   Culture, blood (routine x 2)     Status: None (Preliminary result)   Collection Time: 07/25/14 11:10 PM  Result Value Ref Range   Specimen Description BLOOD RIGHT ANTECUBITAL    Special Requests BOTTLES DRAWN AEROBIC ONLY 4CC    Culture  Setup Time      07/26/2014 10:59 Performed at [place] NO GROWTH TO DATE CULTURE WILL BE HELD FOR 5 DAYS BEFORE ISSUING A FINAL NEGATIVE REPORT Performed at [facility]    Report Status PENDING   Type and screen     Status: None   Collection Time: 07/25/14 11:16 PM  Result Value Ref Range  ABO/RH(D) O POS    Antibody Screen NEG    Sample Expiration 07/28/2014    Unit Number [id number]    Blood Component Type RED CELLS,LR    Unit division 00    Status of Unit ISSUED,FINAL    Transfusion Status OK TO TRANSFUSE    Crossmatch Result Compatible   Troponin I (q 6hr x 3)     Status: None   Collection Time: 07/25/14 11:16 PM  Result Value Ref Range   Troponin I <0.30 <0.30 ng/mL    Comment:        Due to the release kinetics of cTnI, a negative result within the first hours of the onset of  symptoms does not rule out myocardial infarction with certainty. If myocardial infarction is still suspected, repeat the test at appropriate intervals.   Culture, blood (routine x 2)     Status: None (Preliminary result)   Collection Time: 07/25/14 11:16 PM  Result Value Ref Range   Specimen Description BLOOD LEFT ANTECUBITAL    Special Requests BOTTLES DRAWN AEROBIC ONLY 4CC    Culture  Setup Time      07/26/2014 11:00 Performed at [place] NO GROWTH TO DATE CULTURE WILL BE HELD FOR 5 DAYS BEFORE ISSUING A FINAL NEGATIVE REPORT Performed at [facility]    Report Status PENDING   TSH     Status: None   Collection Time: 07/25/14 11:16 PM  Result Value Ref Range   TSH 3.500 0.350 - 4.500 uIU/mL    Comment: Performed at [facility]  CBC     Status: Abnormal   Collection Time: 07/25/14 11:16 PM  Result Value Ref Range   WBC 10.9 (H) 4.0 - 10.5 K/uL   RBC 3.04 (L) 3.87 - 5.11 MIL/uL   Hemoglobin 7.1 (L) 12.0 - 15.0 g/dL   HCT 21.4 (L) 36.0 - 46.0 %   MCV 70.4 (L) 78.0 - 100.0 fL   MCH 23.4 (L) 26.0 - 34.0 pg   MCHC 33.2 30.0 - 36.0 g/dL   RDW 22.1 (H) 11.5 - 15.5 %   Platelets 357 150 - 400 K/uL  Osmolality     Status: Abnormal   Collection Time: 07/25/14 11:16 PM  Result Value Ref Range   Osmolality 258 (L) 275 - 300 mOsm/kg    Comment: Performed at [place] metabolic panel     Status: Abnormal   Collection Time: 07/25/14 11:16 PM  Result Value Ref Range   Sodium 118 (LL) 137 - 147 mEq/L    Comment: CRITICAL RESULT CALLED TO, READ BACK BY AND VERIFIED WITH: BANKS,T RN AT 0017 11.05.15 BY TIBBITTS,K    Potassium 4.5 3.7 - 5.3 mEq/L   Chloride 83 (L) 96 - 112 mEq/L   CO2 23 19 - 32 mEq/L   Glucose, Bld 209 (H) 70 - 99 mg/dL   BUN 35 (H) 6 - 23 mg/dL   Creatinine, Ser 0.96 0.50 - 1.10 mg/dL   Calcium 9.4 8.4 - 10.5 mg/dL   GFR calc non Af Amer 51 (L) >90 mL/min   GFR calc Af  Amer 59 (L) >90 mL/min    Comment: (NOTE) The eGFR has been calculated using the CKD EPI equation. This calculation has not been validated in all clinical situations. eGFR's persistently <90 mL/min signify possible Chronic Kidney Disease.    Anion gap  12 5 - 15  Digoxin level     Status: Abnormal   Collection Time: 07/25/14 11:16 PM  Result Value Ref Range   Digoxin Level 0.6 (L) 0.8 - 2.0 ng/mL  Magnesium     Status: None   Collection Time: 07/25/14 11:16 PM  Result Value Ref Range   Magnesium 1.9 1.5 - 2.5 mg/dL  Phosphorus     Status: None   Collection Time: 07/25/14 11:16 PM  Result Value Ref Range   Phosphorus 2.4 2.3 - 4.6 mg/dL  Protime-INR     Status: Abnormal   Collection Time: 07/25/14 11:16 PM  Result Value Ref Range   Prothrombin Time 15.9 (H) 11.6 - 15.2 seconds   INR 1.26 0.00 - 1.49  APTT     Status: None   Collection Time: 07/25/14 11:16 PM  Result Value Ref Range   aPTT 30 24 - 37 seconds  Prepare RBC     Status: None   Collection Time: 07/25/14 11:16 PM  Result Value Ref Range   Order Confirmation ORDER PROCESSED BY BLOOD BANK   Osmolality, urine     Status: Abnormal   Collection Time: 07/25/14 11:35 PM  Result Value Ref Range   Osmolality, Ur 333 (L) 390 - 1090 mOsm/kg    Comment: Performed at [facility]  Sodium, urine, random     Status: None   Collection Time: 07/25/14 11:35 PM  Result Value Ref Range   Sodium, Ur 31 mEq/L    Comment: Performed at [facility]  Troponin I (q 6hr x 3)     Status: None   Collection Time: 07/26/14  4:20 AM  Result Value Ref Range   Troponin I <0.30 <0.30 ng/mL    Comment:        Due to the release kinetics of cTnI, a negative result within the first hours of the onset of symptoms does not rule out myocardial infarction with certainty. If myocardial infarction is still suspected, repeat the test at appropriate intervals.   CBC     Status: Abnormal   Collection Time: 07/26/14  4:20 AM  Result  Value Ref Range   WBC 13.2 (H) 4.0 - 10.5 K/uL   RBC 3.65 (L) 3.87 - 5.11 MIL/uL   Hemoglobin 8.9 (L) 12.0 - 15.0 g/dL    Comment: DELTA CHECK NOTED REPEATED TO VERIFY    HCT 26.7 (L) 36.0 - 46.0 %   MCV 73.2 (L) 78.0 - 100.0 fL   MCH 24.4 (L) 26.0 - 34.0 pg   MCHC 33.3 30.0 - 36.0 g/dL   RDW 23.6 (H) 11.5 - 15.5 %   Platelets 363 150 - 400 K/uL  Comprehensive metabolic panel     Status: Abnormal   Collection Time: 07/26/14  4:20 AM  Result Value Ref Range   Sodium 121 (LL) 137 - 147 mEq/L    Comment: CRITICAL RESULT CALLED TO, READ BACK BY AND VERIFIED WITH: MITCHELL,C RN AT 0505 11.05.15 BY TIBBITTS,K    Potassium 4.9 3.7 - 5.3 mEq/L   Chloride 87 (L) 96 - 112 mEq/L   CO2 19 19 - 32 mEq/L   Glucose, Bld 126 (H) 70 - 99 mg/dL   BUN 36 (H) 6 - 23 mg/dL   Creatinine, Ser 1.02 0.50 - 1.10 mg/dL   Calcium 9.2 8.4 - 10.5 mg/dL   Total Protein 5.3 (L) 6.0 - 8.3 g/dL   Albumin 2.6 (L) 3.5 - 5.2 g/dL   AST 57 (H) 0 -  37 U/L   ALT 155 (H) 0 - 35 U/L   Alkaline Phosphatase 216 (H) 39 - 117 U/L   Total Bilirubin 0.8 0.3 - 1.2 mg/dL   GFR calc non Af Amer 48 (L) >90 mL/min   GFR calc Af Amer 55 (L) >90 mL/min    Comment: (NOTE) The eGFR has been calculated using the CKD EPI equation. This calculation has not been validated in all clinical situations. eGFR's persistently <90 mL/min signify possible Chronic Kidney Disease.    Anion gap 15 5 - 15  Pro b natriuretic peptide (BNP)     Status: Abnormal   Collection Time: 07/26/14  4:20 AM  Result Value Ref Range   Pro B Natriuretic peptide (BNP) 18005.0 (H) 0 - 450 pg/mL  Glucose, capillary     Status: Abnormal   Collection Time: 07/26/14  7:32 AM  Result Value Ref Range   Glucose-Capillary 120 (H) 70 - 99 mg/dL  Troponin I (q 6hr x 3)     Status: None   Collection Time: 07/26/14 10:54 AM  Result Value Ref Range   Troponin I <0.30 <0.30 ng/mL    Comment:        Due to the release kinetics of cTnI, a negative result within the  first hours of the onset of symptoms does not rule out myocardial infarction with certainty. If myocardial infarction is still suspected, repeat the test at appropriate intervals.   CBC     Status: Abnormal   Collection Time: 07/26/14  1:00 PM  Result Value Ref Range   WBC 11.1 (H) 4.0 - 10.5 K/uL   RBC 3.88 3.87 - 5.11 MIL/uL   Hemoglobin 9.4 (L) 12.0 - 15.0 g/dL   HCT 28.2 (L) 36.0 - 46.0 %   MCV 72.7 (L) 78.0 - 100.0 fL   MCH 24.2 (L) 26.0 - 34.0 pg   MCHC 33.3 30.0 - 36.0 g/dL   RDW 22.7 (H) 11.5 - 15.5 %   Platelets 387 150 - 400 K/uL  Basic metabolic panel     Status: Abnormal   Collection Time: 07/27/14  4:28 AM  Result Value Ref Range   Sodium 120 (LL) 137 - 147 mEq/L    Comment: CRITICAL RESULT CALLED TO, READ BACK BY AND VERIFIED WITH: SPOKE WITH SCOTTON,J RN [phone] [place],N CORRECT DATE OF CALL 07/27/14    Potassium 3.7 3.7 - 5.3 mEq/L    Comment: DELTA CHECK NOTED REPEATED TO VERIFY    Chloride 85 (L) 96 - 112 mEq/L   CO2 23 19 - 32 mEq/L   Glucose, Bld 108 (H) 70 - 99 mg/dL   BUN 31 (H) 6 - 23 mg/dL   Creatinine, Ser 0.99 0.50 - 1.10 mg/dL   Calcium 9.4 8.4 - 10.5 mg/dL   GFR calc non Af Amer 49 (L) >90 mL/min   GFR calc Af Amer 57 (L) >90 mL/min    Comment: (NOTE) The eGFR has been calculated using the CKD EPI equation. This calculation has not been validated in all clinical situations. eGFR's persistently <90 mL/min signify possible Chronic Kidney Disease.    Anion gap 12 5 - 15  CBC     Status: Abnormal   Collection Time: 07/27/14  4:28 AM  Result Value Ref Range   WBC 13.0 (H) 4.0 - 10.5 K/uL   RBC 3.58 (L) 3.87 - 5.11 MIL/uL   Hemoglobin 8.8 (L) 12.0 - 15.0 g/dL   HCT 25.9 (L) 36.0 - 46.0 %  MCV 72.3 (L) 78.0 - 100.0 fL   MCH 24.6 (L) 26.0 - 34.0 pg   MCHC 34.0 30.0 - 36.0 g/dL   RDW 22.6 (H) 11.5 - 15.5 %   Platelets 339 150 - 400 K/uL  Glucose, capillary     Status: None   Collection Time: 07/27/14  8:11 AM  Result Value Ref  Range   Glucose-Capillary 98 70 - 99 mg/dL    Dg Chest 2 View  07/25/2014   CLINICAL DATA:  History of metastatic colonic malignancy, atrial fibrillation, and CHF; onset of tachycardia and hypertension today  EXAM: CHEST  2 VIEW  COMPARISON:  PA and lateral chest of July 13, 2014  FINDINGS: There is increased density at the right lung base consistent with increased pleural fluid. On the left there is persistent obscuration of portions of the left heart border and of the hemidiaphragm. The cardiac silhouette is enlarged. The central pulmonary vascularity is prominent. There is tortuosity of the ascending and descending thoracic aorta. There is a large hiatal hernia. The observed bony structures are unremarkable.  IMPRESSION: Increased pleural fluid collections bilaterally as well as increased pulmonary vascularity are most compatible with congestive heart failure.   Electronically Signed   By: David  Martinique   On: 07/25/2014 16:31   Ct Abdomen Pelvis W Contrast  07/26/2014   CLINICAL DATA:  Abdominal distension status post partial colectomy on 07/06/2014 for newly diagnosed metastatic colon cancer.  EXAM: CT ABDOMEN AND PELVIS WITH CONTRAST  TECHNIQUE: Multidetector CT imaging of the abdomen and pelvis was performed using the standard protocol following bolus administration of intravenous contrast.  CONTRAST:  1 OMNIPAQUE IOHEXOL 300 MG/ML SOLN, 179m OMNIPAQUE IOHEXOL 300 MG/ML SOLN  COMPARISON:  CT chest, abdomen, and pelvis 06/22/2014  FINDINGS: Small to moderate sized bilateral pleural effusions, right larger than left, are partially visualized. Compressive atelectasis is partially visualized in the lower lobes. Large hiatal hernia is partially visualized. Heart is mildly enlarged.  Low-density liver lesions measure 1.5 cm in the caudate and 1.5 cm in the inferior right hepatic lobe, unchanged and compatible with cysts. Additional subcentimeter hypodense liver lesions are again seen and are too small to  fully characterize but may also represent cysts or biliary hamartomas. There is reflux of contrast into dilated suprahepatic IVC and hepatic veins.  There is new, diffuse mild to moderate gallbladder wall thickening. Mild dilatation of the common bile duct with distal tapering is similar to the prior study. There is a new 2.4 cm low-density lesion projecting from the inferior aspect of the spleen. 2.7 x 1.8 cm right adrenal mass has slightly increased in size (previously 2.3 x 1.6 cm).  4.8 x 4.7 cm upper pole left renal mass appears slightly larger (previously 4.5 x 4.2 cm). 6.0 cm right lower pole renal cyst is unchanged. Additional, smaller low to intermediate density bilateral renal lesions do not appear significantly changed in size with most being too small to fully characterize. Mild nodularity of the left adrenal gland is unchanged without discrete mass identified. Pancreas is unremarkable.  Oral contrast is present in the multiple nondilated loops of small bowel. Sequelae of interval left hemicolectomy are identified. There is moderate gaseous distension of the proximal sigmoid and proximal transverse colon about the anastomosis. Ascending colon contains a moderate amount of stool and is nondilated. Oral contrast is present to the level of the ileocecal valve. No intraperitoneal free air is identified.  Small amount of gas is present in the left upper quadrant  abdominal wall (series 2, image 28) with a small amount of surrounding fluid, however no organized fluid collection is identified to clearly indicate abscess and this may be residua from recent surgery. There is new, small volume ascites in the abdomen and pelvis. There is new diffuse anasarca. Small amount of soft tissue with calcifications in the adnexa are unchanged and likely represent the ovaries. Small calcification associated with the right uterine body is unchanged. No enlarged lymph nodes are identified.  Superficial soft tissue  stranding/thickening overlying the sacrum is similar to the prior study. No suspicious lytic or blastic osseous lesions are identified. Advanced disc degeneration is noted L4-5. Prior T12-L2 laminectomies are noted.  IMPRESSION: 1. Enlarging right adrenal mass and new splenic lesion, concerning for worsening metastatic disease. 2. Slightly increased size of left upper pole renal mass, which may reflect a primary renal cell carcinoma or a metastasis. 3. Interval left hemicolectomy. Moderate gaseous colonic distention about the anastomosis without evidence of bowel obstruction. 4. New pleural effusions, ascites, and anasarca. 5. New gallbladder wall thickening, likely secondary to generalized fluid overload.   Electronically Signed   By: Logan Bores   On: 07/26/2014 11:32   Nm Pulmonary Perf And Vent  07/25/2014   CLINICAL DATA:  Shortness of Breath  EXAM: NUCLEAR MEDICINE VENTILATION - PERFUSION LUNG SCAN  Views: Anterior, posterior, left lateral, right lateral, RPO, LPO, RAO, LAO -ventilation and perfusion  Radionuclide: Technetium 3mDTPA -ventilation; Technetium 98macroaggregated albumin- perfusion  Dose:  36.5 mCi-ventilation; 5.2 mCi- perfusion  Route of administration: Inhalation-ventilation; intravenous-perfusion  COMPARISON:  Chest radiograph July 25, 2014  FINDINGS: Ventilation: There is absent ventilation in the lateral right base in an area of consolidation seen on the chest radiograph. Elsewhere, the ventilation is essentially unremarkable bilaterally. Cardiomegaly is noted.  Perfusion: There is absent perfusion in the area of consolidation in the lateral right base which corresponds to an area of absent ventilation. There is cardiomegaly. Elsewhere, the perfusion appears unremarkable. No appreciable ventilation/ perfusion mismatch is seen.  IMPRESSION: There is a matching ventilation perfusion defect in the lateral right base which corresponds to an area of consolidation on the chest  radiograph. This finding is in a nonsegmental distribution. There are no appreciable ventilation/perfusion mismatches. This study over all constitutes a low probability of pulmonary embolus.   Electronically Signed   By: WiLowella Grip.D.   On: 07/25/2014 20:14    Assessment/Plan 1 atrial fibrillation-the patient presented with elevated heart rate on metoprolol and digoxin. Cardizem was added and she is now somewhat bradycardic. Discontinue Cardizem CD and add Cardizem 30 mg every 6 hours. Follow heart rate and adjust regimen as needed. Some of her initial tachycardia most likely driven by recent surgery, anemia and metastatic cancer. She is not a Coumadin candidate given recent surgery, GI blood loss anemia. 2 metastatic colon cancer-the patient has severe disease and prognosis is poor. She is a no CODE BLUE and previously on hospice. Would favor comfort measures. 3 hyponatremia-management per primary care. Given metastatic disease to her lungs I wonder if there may be a component of SIADH. 4 acute diastolic heart failure-patient has mild volume excess on exam. Her chest x-ray notes effusions and edema. There may be a malignant component to her pleural effusions. Add low-dose Lasix 20 mg daily. Fluid restrict. 5 acute on chronic blood loss anemia-management per primary care.  BrKirk RuthsD 07/27/2014, 11:00 AM

## 2014-07-27 NOTE — Clinical Documentation Improvement (Signed)
  Admitted with Afib RVR, SOB. Noted CXR with "CHF" and documented "Diastolic CHF" with volume overload. Please add acuity to documentation to illustrate severity of illness and risk of mortality.  . Document acuity --Acute --Chronic --Acute on Chronic . Document type --Diastolic --Systolic --Combined systolic and diastolic . Due to or associated with --Cardiac rhythm or rate  --Hypertension --Other (specify)  Thank You,  [place] [place] [phone] HIM department

## 2014-07-27 NOTE — Plan of Care (Signed)
Problem: Phase I Progression Outcomes Goal: Heart rate or rhythm control medication Outcome: Completed/Met Date Met:  07/27/14 Goal: Pain controlled with appropriate interventions Outcome: Completed/Met Date Met:  07/27/14 Goal: Initial discharge plan identified Outcome: Completed/Met Date Met:  07/27/14     

## 2014-07-27 NOTE — Progress Notes (Signed)
Patient ID: Kirsten Golden, female   DOB: 10/08/1923, 80 y.o.   MRN: [id number] [facility] PROGRESS NOTE  Charnita Trudel XFG:[id number] DOB: 1924/04/02 DOA: 07/25/2014 PCP: Casilda Carls, MD  Brief narrative: 80 y.o. female with a PMH of atrial fibrillation (not on anti-coagulation, CHF, HTN, hyperlipidemia, hypothyroidism, colon cancer status post partial colectomy 07/06/14 who was admitted 07/25/14 with a chief complaint of shortness of breath and altered mental status. She is under hospice care at home, and her hospice nurse directed her to the ER secondary to tachycardia. Upon initial evaluation in the ER, the patient was noted to have a drop in her baseline hemoglobin from 8.2 on 07/14/14, to 7.4. She was also found to have a WBC of 11.6, a sodium of 119, and a chest x-ray concerning for congestive heart failure. VQ scan showed low probability of pulmonary embolism.  Assessment/Plan:    Principal Problem:  Atrial fibrillation with rapid ventricular response  The patient was admitted to the SDU and placed on a Cardizem drip.Now transitioned to PO Cardizem; she was on once daily regimen and developed bradycardia. Per cardiology recommendations we started on Cardizem 30 mg PO Q 6 hours. Monitor HR on telemetry.   Continue digoxin 0.125 mg daily. Digoxin level was low at 0.6.  Continue aspirin daily. Not a candidate for anticoagulation due to history of GI bleed.   Troponins were negative 3 sets.  TSH is WNL.  Active Problems:  Anemia / GIB (gastrointestinal bleeding)  The patient's anemia is felt to be from chronic GI blood loss given her history of colon cancer.  Status post 1 unit of PRBCs.  Hemoglobin this am is 8.8.   Colon cancer / lung nodule  Hospice care.  Asked if Dr. Lindi Adie can speak with family again about prognosis. Family understands poor prognosis and that currently chemotherapy is not an option. I spoke with the patient's daughter and  explained to her that there is an evidence of disease progression based on CT scan studies. She still wanted to speak with Dr. Lindi Adie.    Hypothyroidism  TSH WNL   Hypertension  Blood pressure on soft side. Monitor on telemetry.   On Cardizem 30 mg PO Q 6 hours. May need to be held if BP continues to be low.    CHF (congestive heart failure), chronic systolic and diastolic  Status post 2-D echocardiogram 07/19/14, EF 55-60 percent.  Diuretics held on admission. Cardio recommended lasix 20 mg daily but BP 91/47 and pt already on Cardizem so held lasix.   Acute Encephalopathy  Likely secondary to metabolic causes with hyponatremia and rapid A. fib.   Hyponatremia  Baseline sodium 130 on 07/10/14. Serum osmolarity 258, urine osmolarity 333, urine sodium 31 (WNL).  Possible SIADH from malignancy or CHF etiology.  Treated with 3% sodium chloride for 6 hours, sodium level 118---> 120.  Hold diuretics due to low BP.  DVT Prophylaxis   SCD's and aspirin   Code Status: DNR / DNI Family Communication:  plan of care discussed with the patient's daughter  Disposition Plan: Home when stable.   IV access:   PeripheralIV  Procedures and diagnostic studies:    Dg Chest 2 View 07/25/2014: Increased pleural fluid collections bilaterally as well as increased pulmonary vascularity are most compatible with congestive heart failure.   Nm Pulmonary Perf And Vent 07/25/2014: There is a matching ventilation perfusion defect in the lateral right base which corresponds to an area of consolidation on the chest radiograph. This finding is in  a nonsegmental distribution. There are no appreciable ventilation/perfusion mismatches. This study over all constitutes a low probability of pulmonary embolus.   Medical Consultants:   Cardiology  Oncology (Dr. Dorinda Hill)   Other Consultants:   Hospice  IAnti-Infectives:    None    Leisa Lenz, MD  [facility] Pager  [phone]  If 7PM-7AM, please contact night-coverage [email or web address] Password [email or web address] 07/27/2014, 5:48 PM   LOS: 2 days    HPI/Subjective: No acute overnight events.  Objective: Filed Vitals:   07/27/14 0010 07/27/14 0553 07/27/14 0958 07/27/14 1455  BP: 90/70 110/74  91/47  Pulse: 65 73 85 47  Temp:  98.1 F (36.7 C)    TempSrc:  Oral    Resp:  18  20  Height:      Weight:  54.8 kg (120 lb 13 oz)    SpO2:  98%  98%    Intake/Output Summary (Last 24 hours) at 07/27/14 1748 Last data filed at 07/27/14 1300  Gross per 24 hour  Intake     50 ml  Output      0 ml  Net     50 ml    Exam:   General:  Pt is alert, follows commands appropriately, not in acute distress  Cardiovascular: irregular rhythm, bradycardic, S1/S2 appreciated   Respiratory: Clear to auscultation bilaterally, no wheezing, no crackles, no rhonchi  Abdomen: firm abdomen to palpation, bowel sounds present  Extremities: pulses DP and PT palpable bilaterally  Neuro: Grossly nonfocal  Data Reviewed: Basic Metabolic Panel:  Recent Labs Lab 07/25/14 1633 07/25/14 2316 07/26/14 0420 07/27/14 0428  NA 119* 118* 121* 120*  K 4.9 4.5 4.9 3.7  CL 82* 83* 87* 85*  CO2 21 23 19 23   GLUCOSE 151* 209* 126* 108*  BUN 38* 35* 36* 31*  CREATININE 1.05 0.96 1.02 0.99  CALCIUM 9.8 9.4 9.2 9.4  MG  --  1.9  --   --   PHOS  --  2.4  --   --    Liver Function Tests:  Recent Labs Lab 07/25/14 1633 07/26/14 0420  AST 60* 57*  ALT 183* 155*  ALKPHOS 238* 216*  BILITOT 0.3 0.8  PROT 5.8* 5.3*  ALBUMIN 2.8* 2.6*   No results for input(s): LIPASE, AMYLASE in the last 168 hours. No results for input(s): AMMONIA in the last 168 hours. CBC:  Recent Labs Lab 07/25/14 1633 07/25/14 2316 07/26/14 0420 07/26/14 1300 07/27/14 0428  WBC 11.6* 10.9* 13.2* 11.1* 13.0*  NEUTROABS 9.8*  --   --   --   --   HGB 7.4* 7.1* 8.9* 9.4* 8.8*  HCT 22.3* 21.4* 26.7* 28.2* 25.9*  MCV 71.0* 70.4* 73.2* 72.7* 72.3*   PLT 371 357 363 387 339   Cardiac Enzymes:  Recent Labs Lab 07/25/14 1633 07/25/14 2316 07/26/14 0420 07/26/14 1054  TROPONINI <0.30 <0.30 <0.30 <0.30   BNP: Invalid input(s): POCBNP CBG:  Recent Labs Lab 07/26/14 0732 07/27/14 0811  GLUCAP 120* 98    Recent Results (from the past 240 hour(s))  MRSA PCR Screening     Status: None   Collection Time: 07/25/14 10:56 PM  Result Value Ref Range Status   MRSA by PCR NEGATIVE NEGATIVE Final    Comment:        The GeneXpert MRSA Assay (FDA approved for NASAL specimens only), is one component of a comprehensive MRSA colonization surveillance program. It is not intended to diagnose MRSA infection nor to guide or monitor  treatment for MRSA infections.   Culture, blood (routine x 2)     Status: None (Preliminary result)   Collection Time: 07/25/14 11:10 PM  Result Value Ref Range Status   Specimen Description BLOOD RIGHT ANTECUBITAL  Final   Special Requests BOTTLES DRAWN AEROBIC ONLY 4CC  Final   Culture  Setup Time   Final    07/26/2014 10:59 Performed at [facility]    Culture   Final           BLOOD CULTURE RECEIVED NO GROWTH TO DATE CULTURE WILL BE HELD FOR 5 DAYS BEFORE ISSUING A FINAL NEGATIVE REPORT Performed at [facility]    Report Status PENDING  Incomplete  Culture, blood (routine x 2)     Status: None (Preliminary result)   Collection Time: 07/25/14 11:16 PM  Result Value Ref Range Status   Specimen Description BLOOD LEFT ANTECUBITAL  Final   Special Requests BOTTLES DRAWN AEROBIC ONLY 4CC  Final   Culture  Setup Time   Final    07/26/2014 11:00 Performed at [facility]    Culture   Final           BLOOD CULTURE RECEIVED NO GROWTH TO DATE CULTURE WILL BE HELD FOR 5 DAYS BEFORE ISSUING A FINAL NEGATIVE REPORT Performed at [facility]    Report Status PENDING  Incomplete     Scheduled Meds: . acetaminophen  500 mg Oral QID  . aspirin EC  81 mg Oral Daily  .  digoxin  0.125 mg Oral Daily  . diltiazem  30 mg Oral 4 times per day  . ferrous fumarate  1 tablet Oral Daily  . furosemide  20 mg Oral Daily  . hydrocortisone   Rectal TID  . hydrocortisone  25 mg Rectal BID  . levothyroxine  50 mcg Oral QAC breakfast  . metoprolol  100 mg Oral BID  . pantoprazole  40 mg Oral Daily  . senna-docusate  1 tablet Oral BID   Continuous Infusions: . sodium chloride 50 mL/hr at 07/26/14 0840

## 2014-07-27 NOTE — Evaluation (Signed)
Occupational Therapy Evaluation Patient Details Name: Kirsten Golden MRN: [id number] DOB: Jan 08, 1924 Today's Date: 07/27/2014    History of Present Illness 80 yo female admitted with A fib with RVR, SOB, encephalopathy. Hx of met colon cancer, HTN, osteoporosis, colectomy 06/2014. Pt is not English-speaking. Daughter was present during session and assisted with translation.    Clinical Impression   Pt appears overall fatigued and tasks requiring increased effort. Daughter present and provided history. Pt on [facility] when therapy arrived and pt worked on performing self care and then ambulated in hallways. Plan is to return home with family and caregiver.     Follow Up Recommendations  No OT follow up;Supervision/Assistance - 24 hour    Equipment Recommendations  None recommended by OT    Recommendations for Other Services       Precautions / Restrictions Precautions Precautions: Fall Restrictions Weight Bearing Restrictions: No      Mobility Bed Mobility Overal bed mobility: Needs Assistance             General bed mobility comments: pt on BSC  Transfers Overall transfer level: Needs assistance Equipment used: Rolling walker (2 wheeled) Transfers: Sit to/from Stand Sit to Stand: Min guard         General transfer comment: close guard for safety    Balance                                            ADL Overall ADL's : Needs assistance/impaired Eating/Feeding: Set up;Sitting   Grooming: Set up;Sitting   Upper Body Bathing: Set up;Supervision/ safety;Sitting   Lower Body Bathing: Moderate assistance;Sit to/from stand   Upper Body Dressing : Minimal assistance;Sitting   Lower Body Dressing: Moderate assistance;Sit to/from stand   Toilet Transfer: Min guard;BSC;RW;Ambulation   Toileting- Water quality scientist and Hygiene: Minimal assistance;Moderate assistance;Sit to/from stand         General ADL Comments: Pt on BSC when OT  and PT arrived. Daughter states her and a caregiver assist with ADL. She helps with washing her own arms but needs assist with LB. Pt able to stand and help pull up mesh underwear. Pt overall fatigued appearing today.      Vision                     Perception     Praxis      Pertinent Vitals/Pain Pain Assessment: No/denies pain     Hand Dominance     Extremity/Trunk Assessment Upper Extremity Assessment Upper Extremity Assessment: Generalized weakness      Cervical / Trunk Assessment Cervical / Trunk Assessment: Kyphotic   Communication Communication Communication: Prefers language other than English   Cognition Arousal/Alertness: Awake/alert Behavior During Therapy: WFL for tasks assessed/performed Overall Cognitive Status: Within Functional Limits for tasks assessed                     General Comments       Exercises       Shoulder Instructions      Home Living Family/patient expects to be discharged to:: Private residence Living Arrangements: Children Available Help at Discharge: Personal care attendant;Family                         Home Equipment: Bedside commode;Walker - 2 wheels  Prior Functioning/Environment Level of Independence: Needs assistance    ADL's / Homemaking Assistance Needed: had caregiver assist with ADL and family assists also.        OT Diagnosis: Generalized weakness   OT Problem List: Decreased strength;Decreased knowledge of use of DME or AE   OT Treatment/Interventions: Self-care/ADL training;Patient/family education;Therapeutic activities;DME and/or AE instruction    OT Goals(Current goals can be found in the care plan section) Acute Rehab OT Goals Patient Stated Goal: home per daughter OT Goal Formulation: With patient Time For Goal Achievement: 08/03/14 Potential to Achieve Goals: Good  OT Frequency: Min 2X/week   Barriers to D/C:            Co-evaluation               End of Session Equipment Utilized During Treatment: Rolling walker  Activity Tolerance: Patient limited by fatigue Patient left: in chair;with call bell/phone within reach;with family/visitor present   Time: 1231-1252 OT Time Calculation (min): 21 min Charges:  OT General Charges $OT Visit: 1 Procedure OT Evaluation $Initial OT Evaluation Tier I: 1 Procedure OT Treatments $Therapeutic Activity: 8-22 mins G-Codes:    Jules Schick  [phone] 07/27/2014, 1:30 PM

## 2014-07-27 NOTE — Telephone Encounter (Signed)
Call rcvd re: the patient.  Family would like to meet with Dr Lindi Adie again to ask questions and confirm the recommendation for hospice.  Requesting Dr Lindi Adie meet with family tomorrow, as caller knows he is out of town today.  Pt is in [place].  Routed to Dr. Lindi Adie.

## 2014-07-28 LAB — BASIC METABOLIC PANEL
Anion gap: 12 (ref 5–15)
BUN: 24 mg/dL — ABNORMAL HIGH (ref 6–23)
CHLORIDE: 92 meq/L — AB (ref 96–112)
CO2: 26 meq/L (ref 19–32)
Calcium: 9.9 mg/dL (ref 8.4–10.5)
Creatinine, Ser: 0.95 mg/dL (ref 0.50–1.10)
GFR calc Af Amer: 60 mL/min — ABNORMAL LOW (ref >90)
GFR calc non Af Amer: 52 mL/min — ABNORMAL LOW (ref >90)
GLUCOSE: 106 mg/dL — AB (ref 70–99)
Potassium: 3.4 mEq/L — ABNORMAL LOW (ref 3.7–5.3)
SODIUM: 130 meq/L — AB (ref 137–147)

## 2014-07-28 LAB — GLUCOSE, CAPILLARY
GLUCOSE-CAPILLARY: 121 mg/dL — AB (ref 70–99)
Glucose-Capillary: 177 mg/dL — ABNORMAL HIGH (ref 70–99)

## 2014-07-28 MED ORDER — FUROSEMIDE 10 MG/ML IJ SOLN
40.0000 mg | Freq: Two times a day (BID) | INTRAMUSCULAR | Status: DC
  Administered 2014-07-28 – 2014-07-30 (×5): 40 mg via INTRAVENOUS
  Filled 2014-07-28 (×5): qty 4

## 2014-07-28 MED ORDER — POTASSIUM CHLORIDE CRYS ER 20 MEQ PO TBCR
20.0000 meq | EXTENDED_RELEASE_TABLET | Freq: Two times a day (BID) | ORAL | Status: DC
  Administered 2014-07-28 (×2): 20 meq via ORAL
  Filled 2014-07-28 (×2): qty 1

## 2014-07-28 NOTE — Progress Notes (Signed)
Pt son called out of room and said that his mom was unresponsive. The tech and I went in to assess the situation. Patient would not respond to voice, or sternum rub. Patient son tried to speak to her and she only griped his hand and bed rail. Told the son, that this is a status change in the patient and that we should call the daughter. I attempted to call her and it went to voice mail. The son told me that she is not a DNR that they wanted medications gave to his mom. I told him that I would have to contact the NP to make those changes to the chart. I notified Walden Field, NP and she said to call rapid response. Walden Field, NP and RRT came to assess pt. When the daughter showed up at the hospital, she told us that she is not a DNR. They spoke with Walden Field, NP and code status was changed to partial code.

## 2014-07-28 NOTE — Progress Notes (Signed)
Partial Code.  Patient resting with eyes closed and daughter also at the bedside resting.  Did not awaken either.  Chart reviewed and spoke with Baxter Flattery, RN for patient.  Please contact [place] with any questions or concerns.  Vance Gather, RN [facility]

## 2014-07-28 NOTE — Progress Notes (Signed)
Subjective/Objective Patient none responsive to verbal and tactile stimulation. VSS with Temp 97.8, HR 76-116 (atrial fibrillation on telemetry), respirations 20, BP 140/75 and oxygen saturation 98% on 2L nasal cannula supplemental oxygen. Son at bedside requesting aggressive intervention.   Scheduled Meds: . acetaminophen  500 mg Oral QID  . aspirin EC  81 mg Oral Daily  . digoxin  0.125 mg Oral Daily  . diltiazem  30 mg Oral 4 times per day  . ferrous fumarate  1 tablet Oral Daily  . hydrocortisone   Rectal TID  . hydrocortisone  25 mg Rectal BID  . levothyroxine  50 mcg Oral QAC breakfast  . metoprolol  100 mg Oral BID  . pantoprazole  40 mg Oral Daily  . senna-docusate  1 tablet Oral BID  . sodium chloride  3 mL Intravenous Q12H   Continuous Infusions: . sodium chloride 50 mL/hr at 07/26/14 0840   PRN Meds:feeding supplement (ENSURE COMPLETE), haloperidol, HYDROcodone-acetaminophen, levalbuterol, LORazepam, polyvinyl alcohol  Vital signs in last 24 hours: Temp:  [97.1 F (36.2 C)-98.1 F (36.7 C)] 97.1 F (36.2 C) (11/06 2219) Pulse Rate:  [47-85] 84 (11/06 2219) Resp:  [18-20] 18 (11/06 2219) BP: (91-140)/(47-75) 140/75 mmHg (11/06 2136) SpO2:  [98 %] 98 % (11/06 2136) Weight:  [54.8 kg (120 lb 13 oz)] 54.8 kg (120 lb 13 oz) (11/06 0553)  Intake/Output last 3 shifts: I/O last 3 completed shifts: In: 290 [P.O.:280; I.V.:10] Out: 0  Intake/Output this shift: Total I/O In: 60 [P.O.:60] Out: -    Physical exam: Physical Examination: General appearance - non-responsive, shallow respirations Chest - clear to auscultation, no wheezes, rales or rhonchi, symmetric air entry Heart - Irregular rhythm, rate 76-116 - atrial fibrillation Neurological - abnormal findings: initially non-responsive to verbal or tactile stimuli - slowly more responsive- opening eyes, moving all 4 extremities, ultimately verbally responsive.  Problem Assessment/Plan  Acute mental status change -  unclear etiology: On evaluation VSS overall stable, afebrile. ABG obtained which was normal (Ph 7.448, CO2 35.0, PCO2 87.2, HCO3 23.8 and oxygen saturation 95.5%). No sedatives, pain medications had been given. Patient slowly became more arousable and in approximately 1 hour after event was conversing/back to baseline.   Code status. Patient's family (daughter and son - daughter is POA) wish for patient to be limited code. They wish for her to receive hydration, ACLS medications and Bipap if needed. They do not want CPR/defibrillation or intubation. Code status has been changed from DNR to Partial.

## 2014-07-28 NOTE — Progress Notes (Signed)
Progress Note   Kirsten Golden OXB:[id number] DOB: [birth year] DOA: 07/25/2014 PCP: Casilda Carls, MD   Brief Narrative:   Kirsten Golden is an 80 y.o. female with a PMH of atrial fibrillation (not on anti-coagulation, CHF, HTN, hyperlipidemia, hypothyroidism, colon cancer status post partial colectomy 07/06/14 who was admitted 07/25/14 with a chief complaint of shortness of breath and altered mental status. She is under hospice care at home, and her hospice nurse directed her to the ER secondary to tachycardia.  Upon initial evaluation in the ER, the patient was noted to have a drop in her baseline hemoglobin from 8.2 on 07/14/14, to 7.4. She was also found to have a WBC of 11.6, a sodium of 119, and a chest x-ray concerning for congestive heart failure. VQ scan showed low probability of pulmonary embolism.  Assessment/Plan:   Principal Problem:   Atrial fibrillation with rapid ventricular response  The patient was admitted to the SDU and placed on a Cardizem drip.  Became mildly bradycardic when transitioned to oral Cardizem 120 mg every 12 hours. Now on Cardizem 30 mg every 6 hours per cardiology recommendations, after they evaluated the patient on 07/27/14.  Home medications including metoprolol and digoxin were continued.  Digoxin level was low at 0.6.  Troponins were negative 3 sets.  TSH WNL at 3.5.  Active Problems:   Hypokalemia  Likely from resumption of diuretics.  Place on oral supplementation.    Anemia / GIB (gastrointestinal bleeding)  The patient's anemia is felt to be from chronic GI blood loss given her history of colon cancer.  Status post 1 unit of PRBCs.  Hemoglobin slowly trending down since blood transfusion, currently 8.8.  Monitor and transfuse as needed, family requested additional blood given today, however it was explained to them that patients who received conservative transfusion thresholds showed better outcomes then patients who are  given blood transfusions with a more liberal threshold. Nonetheless, given her elevated BNP and concerns for heart failure, a blood transfusion may be more harmful than helpful.  Continue iron therapy.    Colon cancer / lung nodule  Hospice care.    Hypothyroidism  Continue Synthroid.    Hypertension  Blood pressure currently stable.    CHF (congestive heart failure)  Status post 2-D echocardiogram 07/19/14, EF 55-60 percent.  Diuretics held on admission, resumed 07/27/14.    Encephalopathy  Likely secondary to metabolic causes with hyponatremia and rapid A. fib.    Hyponatremia  Baseline sodium 130 on 07/10/14.    Treated with 3% sodium chloride for 6 hours, sodium level 118---> 121.  Diuretics resumed 07/27/14.  Sodium now back to usual baseline values at 130.    DVT Prophylaxis  Continue SCDs.  Code Status: LCB Family Communication: Multiple family updated at bedside. Disposition Plan: Home when stable.   IV Access:    Peripheral IV   Procedures and diagnostic studies:   Dg Chest 2 View 07/25/2014: Increased pleural fluid collections bilaterally as well as increased pulmonary vascularity are most compatible with congestive heart failure.     Nm Pulmonary Perf And Vent 07/25/2014: There is a matching ventilation perfusion defect in the lateral right base which corresponds to an area of consolidation on the chest radiograph. This finding is in a nonsegmental distribution. There are no appreciable ventilation/perfusion mismatches. This study over all constitutes a low probability of pulmonary embolus.     Ct Abdomen Pelvis W Contrast 07/26/2014: 1. Enlarging right adrenal mass and new splenic lesion, concerning for  worsening metastatic disease. 2. Slightly increased size of left upper pole renal mass, which may reflect a primary renal cell carcinoma or a metastasis. 3. Interval left hemicolectomy. Moderate gaseous colonic distention about the anastomosis without  evidence of bowel obstruction. 4. New pleural effusions, ascites, and anasarca. 5. New gallbladder wall thickening, likely secondary to generalized fluid overload.     Medical Consultants:    Dr. Kirk Ruths, Cardiology  Anti-Infectives:    None.  Subjective:   Kirsten Golden is a bit lethargic today.  No specific complaints.  Her family and personal MD is at the bedside, and reiterate the wish that she receive full, supportive care other than CPR/chest compressions in the event of a cardiac arrest.    Objective:    Filed Vitals:   07/27/14 1455 07/27/14 2136 07/27/14 2219 07/28/14 0424  BP: 91/47 140/75  134/88  Pulse: 47 76 84 85  Temp:  97.8 F (36.6 C) 97.1 F (36.2 C) 97.7 F (36.5 C)  TempSrc:  Oral  Oral  Resp: 20 20 18 20   Height:      Weight:    56.7 kg (125 lb)  SpO2: 98% 98%  98%    Intake/Output Summary (Last 24 hours) at 07/28/14 0834 Last data filed at 07/28/14 0700  Gross per 24 hour  Intake    580 ml  Output      0 ml  Net    580 ml    Exam: Gen:  NAD Cardiovascular:  HSIR Respiratory:  Lungs CTAB Gastrointestinal:  Abdomen soft, NT/ND, + BS Extremities:  No C/E/C   Data Reviewed:    Labs: Basic Metabolic Panel:  Recent Labs Lab 07/25/14 1633 07/25/14 2316 07/26/14 0420 07/27/14 0428 07/28/14 0724  NA 119* 118* 121* 120* 130*  K 4.9 4.5 4.9 3.7 3.4*  CL 82* 83* 87* 85* 92*  CO2 21 23 19 23 26   GLUCOSE 151* 209* 126* 108* 106*  BUN 38* 35* 36* 31* 24*  CREATININE 1.05 0.96 1.02 0.99 0.95  CALCIUM 9.8 9.4 9.2 9.4 9.9  MG  --  1.9  --   --   --   PHOS  --  2.4  --   --   --    GFR Estimated Creatinine Clearance: 29.6 mL/min (by C-G formula based on Cr of 0.95). Liver Function Tests:  Recent Labs Lab 07/25/14 1633 07/26/14 0420  AST 60* 57*  ALT 183* 155*  ALKPHOS 238* 216*  BILITOT 0.3 0.8  PROT 5.8* 5.3*  ALBUMIN 2.8* 2.6*   Coagulation profile  Recent Labs Lab 07/25/14 2316  INR 1.26     CBC:  Recent Labs Lab 07/25/14 1633 07/25/14 2316 07/26/14 0420 07/26/14 1300 07/27/14 0428  WBC 11.6* 10.9* 13.2* 11.1* 13.0*  NEUTROABS 9.8*  --   --   --   --   HGB 7.4* 7.1* 8.9* 9.4* 8.8*  HCT 22.3* 21.4* 26.7* 28.2* 25.9*  MCV 71.0* 70.4* 73.2* 72.7* 72.3*  PLT 371 357 363 387 339   Cardiac Enzymes:  Recent Labs Lab 07/25/14 1633 07/25/14 2316 07/26/14 0420 07/26/14 1054  TROPONINI <0.30 <0.30 <0.30 <0.30   BNP (last 3 results)  Recent Labs  07/26/14 0420  PROBNP 18005.0*   Microbiology Recent Results (from the past 240 hour(s))  MRSA PCR Screening     Status: None   Collection Time: 07/25/14 10:56 PM  Result Value Ref Range Status   MRSA by PCR NEGATIVE NEGATIVE Final    Comment:  The GeneXpert MRSA Assay (FDA approved for NASAL specimens only), is one component of a comprehensive MRSA colonization surveillance program. It is not intended to diagnose MRSA infection nor to guide or monitor treatment for MRSA infections.   Culture, blood (routine x 2)     Status: None (Preliminary result)   Collection Time: 07/25/14 11:10 PM  Result Value Ref Range Status   Specimen Description BLOOD RIGHT ANTECUBITAL  Final   Special Requests BOTTLES DRAWN AEROBIC ONLY 4CC  Final   Culture  Setup Time   Final    07/26/2014 10:59 Performed at [facility]    Culture   Final           BLOOD CULTURE RECEIVED NO GROWTH TO DATE CULTURE WILL BE HELD FOR 5 DAYS BEFORE ISSUING A FINAL NEGATIVE REPORT Performed at [facility]    Report Status PENDING  Incomplete  Culture, blood (routine x 2)     Status: None (Preliminary result)   Collection Time: 07/25/14 11:16 PM  Result Value Ref Range Status   Specimen Description BLOOD LEFT ANTECUBITAL  Final   Special Requests BOTTLES DRAWN AEROBIC ONLY 4CC  Final   Culture  Setup Time   Final    07/26/2014 11:00 Performed at [facility]    Culture   Final           BLOOD CULTURE  RECEIVED NO GROWTH TO DATE CULTURE WILL BE HELD FOR 5 DAYS BEFORE ISSUING A FINAL NEGATIVE REPORT Performed at [facility]    Report Status PENDING  Incomplete     Medications:   . acetaminophen  500 mg Oral QID  . aspirin EC  81 mg Oral Daily  . digoxin  0.125 mg Oral Daily  . diltiazem  30 mg Oral 4 times per day  . ferrous fumarate  1 tablet Oral Daily  . hydrocortisone   Rectal TID  . hydrocortisone  25 mg Rectal BID  . levothyroxine  50 mcg Oral QAC breakfast  . metoprolol  100 mg Oral BID  . pantoprazole  40 mg Oral Daily  . senna-docusate  1 tablet Oral BID  . sodium chloride  3 mL Intravenous Q12H   Continuous Infusions: . sodium chloride 50 mL/hr at 07/26/14 0840    Time spent: 35 minutes with > 50% of time discussing current diagnostic test results, clinical impression and plan of care.    LOS: 3 days   [facility] Pager [phone]. If unable to reach me by pager, please call my cell phone at [phone].  *Please refer to [email or web address], password [email or web address] to get updated schedule on who will round on this patient, as hospitalists switch teams weekly. If 7PM-7AM, please contact night-coverage at [email or web address], password [email or web address] for any overnight needs.  07/28/2014, 8:34 AM

## 2014-07-28 NOTE — Plan of Care (Signed)
Problem: Phase II Progression Outcomes Goal: Pain controlled Outcome: Completed/Met Date Met:  07/28/14 Goal: Progress activity as tolerated unless otherwise ordered Outcome: Completed/Met Date Met:  07/28/14

## 2014-07-28 NOTE — Plan of Care (Signed)
Problem: Phase I Progression Outcomes Goal: Hemodynamically stable Outcome: Completed/Met Date Met:  07/28/14  Problem: Phase II Progression Outcomes Goal: Anticoagulation Therapy per MD order Outcome: Completed/Met Date Met:  07/28/14 Goal: Discharge plan established Outcome: Completed/Met Date Met:  07/28/14

## 2014-07-28 NOTE — Progress Notes (Signed)
Subjective:  History obtained through daughter who translates. No chest pain. Does have some wheezing and mild shortness of breath, complains of some burning in her chest.  Objective:  Vital Signs in the last 24 hours: BP 134/88 mmHg  Pulse 85  Temp(Src) 97.7 F (36.5 C) (Oral)  Resp 20  Ht 4\' 9"  (1.448 m)  Wt 56.7 kg (125 lb)  BMI 27.04 kg/m2  SpO2 98%  Physical Exam: Elderly Arabic-appearing womanin no acute distress but clearing throat Lungs:  Clear  Cardiac:  Irregular rhythm, normal S1 and S2, no S3 Abdomen:  Soft, nontender, no masses Extremities:  No edema present  Intake/Output from previous day: 11/06 0701 - 11/07 0700 In: 580 [P.O.:460; I.V.:120] Out: -  Weight Filed Weights   07/26/14 0434 07/27/14 0553 07/28/14 0424  Weight: 53 kg (116 lb 13.5 oz) 54.8 kg (120 lb 13 oz) 56.7 kg (125 lb)    Lab Results: Basic Metabolic Panel:  Recent Labs  07/27/14 0428 07/28/14 0724  NA 120* 130*  K 3.7 3.4*  CL 85* 92*  CO2 23 26  GLUCOSE 108* 106*  BUN 31* 24*  CREATININE 0.99 0.95    CBC:  Recent Labs  07/25/14 1633  07/26/14 1300 07/27/14 0428  WBC 11.6*  < > 11.1* 13.0*  NEUTROABS 9.8*  --   --   --   HGB 7.4*  < > 9.4* 8.8*  HCT 22.3*  < > 28.2* 25.9*  MCV 71.0*  < > 72.7* 72.3*  PLT 371  < > 387 339  < > = values in this interval not displayed.  BNP    Component Value Date/Time   PROBNP 18005.0* 07/26/2014 0420    PROTIME: Lab Results  Component Value Date   INR 1.26 07/25/2014   INR 1.06 07/03/2014    Telemetry: Atrial fibrillation rate still around 110  Assessment/Plan:  1. Atrial fibrillation-rate is slower but still somewhat fast 2. Anemia with metastatic cancer 3. Hyponatremia improved 4. Acute diastolic heart failure worsened with atrial fibrillation  Recommendations:  1. Diuresis with intravenous furosemide in light of elevated BNP and volume excess noted on chest x-ray following renal function and sodium  carefully      W. Doristine Church  MD [facility] Cardiology  07/28/2014, 8:32 AM

## 2014-07-29 LAB — BASIC METABOLIC PANEL
Anion gap: 11 (ref 5–15)
BUN: 21 mg/dL (ref 6–23)
CO2: 29 mEq/L (ref 19–32)
CREATININE: 0.94 mg/dL (ref 0.50–1.10)
Calcium: 9.5 mg/dL (ref 8.4–10.5)
Chloride: 93 mEq/L — ABNORMAL LOW (ref 96–112)
GFR calc Af Amer: 61 mL/min — ABNORMAL LOW (ref >90)
GFR, EST NON AFRICAN AMERICAN: 53 mL/min — AB (ref >90)
GLUCOSE: 102 mg/dL — AB (ref 70–99)
POTASSIUM: 3.4 meq/L — AB (ref 3.7–5.3)
Sodium: 133 mEq/L — ABNORMAL LOW (ref 137–147)

## 2014-07-29 LAB — CBC
HCT: 28.9 % — ABNORMAL LOW (ref 36.0–46.0)
Hemoglobin: 9.4 g/dL — ABNORMAL LOW (ref 12.0–15.0)
MCH: 24.4 pg — AB (ref 26.0–34.0)
MCHC: 32.5 g/dL (ref 30.0–36.0)
MCV: 75.1 fL — AB (ref 78.0–100.0)
PLATELETS: 322 10*3/uL (ref 150–400)
RBC: 3.85 MIL/uL — AB (ref 3.87–5.11)
RDW: 23.5 % — ABNORMAL HIGH (ref 11.5–15.5)
WBC: 12.6 10*3/uL — ABNORMAL HIGH (ref 4.0–10.5)

## 2014-07-29 LAB — GLUCOSE, CAPILLARY: Glucose-Capillary: 96 mg/dL (ref 70–99)

## 2014-07-29 MED ORDER — DILTIAZEM HCL 30 MG PO TABS
30.0000 mg | ORAL_TABLET | Freq: Three times a day (TID) | ORAL | Status: DC
  Administered 2014-07-29 – 2014-07-30 (×3): 30 mg via ORAL
  Filled 2014-07-29 (×3): qty 1

## 2014-07-29 MED ORDER — POTASSIUM CHLORIDE CRYS ER 20 MEQ PO TBCR
20.0000 meq | EXTENDED_RELEASE_TABLET | Freq: Three times a day (TID) | ORAL | Status: DC
  Administered 2014-07-29 – 2014-08-01 (×10): 20 meq via ORAL
  Filled 2014-07-29 (×9): qty 1

## 2014-07-29 NOTE — Progress Notes (Signed)
Subjective:  She is currently sleeping and I did not awaken her. Her breathing is betterAccording to her son who is in the room she has not had recurrent shortness of breath.  Objective:  Vital Signs in the last 24 hours: BP 136/64 mmHg  Pulse 73  Temp(Src) 98.1 F (36.7 C) (Oral)  Resp 20  Ht 4\' 9"  (1.448 m)  Wt 58.5 kg (128 lb 15.5 oz)  BMI 27.90 kg/m2  SpO2 94%  Physical Exam: Elderly Arabic-appearing woman n no acute distress sleeping Lungs:  Clear  Cardiac:  Irregular rhythm, normal S1 and S2, no S3 Extremities:  No edema present  Intake/Output from previous day: 11/07 0701 - 11/08 0700 In: 300 [P.O.:300] Out: -  Weight Filed Weights   07/27/14 0553 07/28/14 0424 07/29/14 0536  Weight: 54.8 kg (120 lb 13 oz) 56.7 kg (125 lb) 58.5 kg (128 lb 15.5 oz)    Lab Results: Basic Metabolic Panel:  Recent Labs  07/28/14 0724 07/29/14 0506  NA 130* 133*  K 3.4* 3.4*  CL 92* 93*  CO2 26 29  GLUCOSE 106* 102*  BUN 24* 21  CREATININE 0.95 0.94    CBC:  Recent Labs  07/27/14 0428 07/29/14 0506  WBC 13.0* 12.6*  HGB 8.8* 9.4*  HCT 25.9* 28.9*  MCV 72.3* 75.1*  PLT 339 322    BNP    Component Value Date/Time   PROBNP 18005.0* 07/26/2014 0420    PROTIME: Lab Results  Component Value Date   INR 1.26 07/25/2014   INR 1.06 07/03/2014    Telemetry: Atrial fibrillation rate still around 110  Assessment/Plan:  1. Atrial fibrillation-rate is slower but still somewhat fast 2. Anemia with metastatic cancer 3. Hyponatremia improved 4. Acute diastolic heart failure worsened with atrial fibrillation- Currently on intravenous diuresis  Recommendations:  No output is recorded so I asked the nurses to keep track of her urine output. Her weight is up today which doesn't make a lot of sense.her sodium is gradually improving. I would continue IV diuresis today and reassess in the morning following weights and intake and output.     Kerry Hough   MD [facility] Cardiology  07/29/2014, 9:14 AM

## 2014-07-29 NOTE — Plan of Care (Signed)
Problem: Phase II Progression Outcomes Goal: Ventricular heart rate < 100/min Outcome: Completed/Met Date Met:  07/29/14 Goal: CV Risk Factors identified Outcome: Completed/Met Date Met:  07/29/14  Problem: Phase III Progression Outcomes Goal: Sinus rhythm established or heart rate < 100 at rest Outcome: Completed/Met Date Met:  07/29/14 Goal: Pain controlled on oral analgesia Outcome: Completed/Met Date Met:  07/29/14 Goal: Activity at appropriate level-compared to baseline (UP IN CHAIR FOR HEMODIALYSIS)  Outcome: Completed/Met Date Met:  07/29/14 Goal: Tolerating diet Outcome: Completed/Met Date Met:  07/29/14

## 2014-07-29 NOTE — Progress Notes (Signed)
Partial Code.  Related admission.  Patient is in the bathroom on my arrival.  Her son is at the bedside.  He verbalizes no concerns at this time and feels patient has improved.  Advised that [facility] would continue to see patient while in the hospital.  He is aware to contact [facility] [phone] with questions, concerns or discharge plans.  Vance Gather, RN [facility]

## 2014-07-29 NOTE — Progress Notes (Signed)
Progress Note   Kirsten Golden AOZ:[id number] DOB: [birth year] DOA: 07/25/2014 PCP: Casilda Carls, MD   Brief Narrative:   Kirsten Golden is an 80 y.o. female with a PMH of atrial fibrillation (not on anti-coagulation, CHF, HTN, hyperlipidemia, hypothyroidism, colon cancer status post partial colectomy 07/06/14 who was admitted 07/25/14 with a chief complaint of shortness of breath and altered mental status. She is under hospice care at home, and her hospice nurse directed her to the ER secondary to tachycardia.  Upon initial evaluation in the ER, the patient was noted to have a drop in her baseline hemoglobin from 8.2 on 07/14/14, to 7.4. She was also found to have a WBC of 11.6, a sodium of 119, and a chest x-ray concerning for congestive heart failure. VQ scan showed low probability of pulmonary embolism.  Assessment/Plan:   Principal Problem:   Atrial fibrillation with rapid ventricular response  The patient was admitted to the SDU and placed on a Cardizem drip.  Became mildly bradycardic when transitioned to oral Cardizem 120 mg every 12 hours. Now on Cardizem 30 mg every 6 hours per cardiology recommendations, after they evaluated the patient on 07/27/14.  Heart rate 58-90.  Change Cardizem to 30 mg every 8 hours.  Home medications including metoprolol and digoxin were continued.  Digoxin level was low at 0.6.  Troponins were negative 3 sets.  TSH WNL at 3.5.  Active Problems:   Hypokalemia  Likely from resumption of diuretics.  Place on oral supplementation.    Anemia / GIB (gastrointestinal bleeding)  The patient's anemia is felt to be from chronic GI blood loss given her history of colon cancer.  Status post 1 unit of PRBCs.  Hemoglobin slowly trending down since blood transfusion, currently 8.8.  Monitor and transfuse as needed, family requested additional blood given today, however it was explained to them that patients who received conservative  transfusion thresholds showed better outcomes then patients who are given blood transfusions with a more liberal threshold. Nonetheless, given her elevated BNP and concerns for heart failure, a blood transfusion may be more harmful than helpful.  Continue iron therapy.  Hemoglobin 9.4 this morning, stable.    Colon cancer / lung nodule  Hospice care.    Hypothyroidism  Continue Synthroid.    Hypertension  Blood pressure currently stable.    CHF (congestive heart failure)  Status post 2-D echocardiogram 07/19/14, EF 55-60 percent.  Diuretics held on admission, resumed 07/27/14.    Encephalopathy  Likely secondary to metabolic causes with hyponatremia and rapid A. fib.    Hyponatremia  Baseline sodium 130 on 07/10/14.    Treated with 3% sodium chloride for 6 hours, sodium level 118---> 121.  Diuretics resumed 07/27/14.  Sodium up to 133 this morning.    DVT Prophylaxis  Continue SCDs.  Code Status: LCB Family Communication: Son updated at bedside. Disposition Plan: Home when stable.   IV Access:    Peripheral IV   Procedures and diagnostic studies:   Dg Chest 2 View 07/25/2014: Increased pleural fluid collections bilaterally as well as increased pulmonary vascularity are most compatible with congestive heart failure.     Nm Pulmonary Perf And Vent 07/25/2014: There is a matching ventilation perfusion defect in the lateral right base which corresponds to an area of consolidation on the chest radiograph. This finding is in a nonsegmental distribution. There are no appreciable ventilation/perfusion mismatches. This study over all constitutes a low probability of pulmonary embolus.     Ct Abdomen  Pelvis W Contrast 07/26/2014: 1. Enlarging right adrenal mass and new splenic lesion, concerning for worsening metastatic disease. 2. Slightly increased size of left upper pole renal mass, which may reflect a primary renal cell carcinoma or a metastasis. 3. Interval left  hemicolectomy. Moderate gaseous colonic distention about the anastomosis without evidence of bowel obstruction. 4. New pleural effusions, ascites, and anasarca. 5. New gallbladder wall thickening, likely secondary to generalized fluid overload.     Medical Consultants:    Dr. Kirk Ruths, Cardiology  Anti-Infectives:    None.  Subjective:   Kirsten Golden is without complaints.  Son reports she is eating well and that her bowels are moving.  No reports of pain/dyspnea.    Objective:    Filed Vitals:   07/28/14 1323 07/28/14 2125 07/29/14 0314 07/29/14 0536  BP: 149/72 128/59 123/64 136/64  Pulse: 61 90 58 73  Temp: 98.1 F (36.7 C) 98.4 F (36.9 C) 98.9 F (37.2 C) 98.1 F (36.7 C)  TempSrc: Oral Oral Oral Oral  Resp: 20 20 20 20   Height:      Weight:    58.5 kg (128 lb 15.5 oz)  SpO2: 97% 96% 98% 94%    Intake/Output Summary (Last 24 hours) at 07/29/14 0815 Last data filed at 07/29/14 0500  Gross per 24 hour  Intake    300 ml  Output      0 ml  Net    300 ml    Exam: Gen:  NAD Cardiovascular:  HSIR Respiratory:  Lungs CTAB Gastrointestinal:  Abdomen soft, NT/ND, + BS Extremities:  No C/E/C   Data Reviewed:    Labs: Basic Metabolic Panel:  Recent Labs Lab 07/25/14 2316 07/26/14 0420 07/27/14 0428 07/28/14 0724 07/29/14 0506  NA 118* 121* 120* 130* 133*  K 4.5 4.9 3.7 3.4* 3.4*  CL 83* 87* 85* 92* 93*  CO2 23 19 23 26 29   GLUCOSE 209* 126* 108* 106* 102*  BUN 35* 36* 31* 24* 21  CREATININE 0.96 1.02 0.99 0.95 0.94  CALCIUM 9.4 9.2 9.4 9.9 9.5  MG 1.9  --   --   --   --   PHOS 2.4  --   --   --   --    GFR Estimated Creatinine Clearance: 30.4 mL/min (by C-G formula based on Cr of 0.94). Liver Function Tests:  Recent Labs Lab 07/25/14 1633 07/26/14 0420  AST 60* 57*  ALT 183* 155*  ALKPHOS 238* 216*  BILITOT 0.3 0.8  PROT 5.8* 5.3*  ALBUMIN 2.8* 2.6*   Coagulation profile  Recent Labs Lab 07/25/14 2316  INR 1.26     CBC:  Recent Labs Lab 07/25/14 1633 07/25/14 2316 07/26/14 0420 07/26/14 1300 07/27/14 0428 07/29/14 0506  WBC 11.6* 10.9* 13.2* 11.1* 13.0* 12.6*  NEUTROABS 9.8*  --   --   --   --   --   HGB 7.4* 7.1* 8.9* 9.4* 8.8* 9.4*  HCT 22.3* 21.4* 26.7* 28.2* 25.9* 28.9*  MCV 71.0* 70.4* 73.2* 72.7* 72.3* 75.1*  PLT 371 357 363 387 339 322   Cardiac Enzymes:  Recent Labs Lab 07/25/14 1633 07/25/14 2316 07/26/14 0420 07/26/14 1054  TROPONINI <0.30 <0.30 <0.30 <0.30   BNP (last 3 results)  Recent Labs  07/26/14 0420  PROBNP 18005.0*   Microbiology Recent Results (from the past 240 hour(s))  MRSA PCR Screening     Status: None   Collection Time: 07/25/14 10:56 PM  Result Value Ref Range Status  MRSA by PCR NEGATIVE NEGATIVE Final    Comment:        The GeneXpert MRSA Assay (FDA approved for NASAL specimens only), is one component of a comprehensive MRSA colonization surveillance program. It is not intended to diagnose MRSA infection nor to guide or monitor treatment for MRSA infections.   Culture, blood (routine x 2)     Status: None (Preliminary result)   Collection Time: 07/25/14 11:10 PM  Result Value Ref Range Status   Specimen Description BLOOD RIGHT ANTECUBITAL  Final   Special Requests BOTTLES DRAWN AEROBIC ONLY 4CC  Final   Culture  Setup Time   Final    07/26/2014 10:59 Performed at [facility]    Culture   Final           BLOOD CULTURE RECEIVED NO GROWTH TO DATE CULTURE WILL BE HELD FOR 5 DAYS BEFORE ISSUING A FINAL NEGATIVE REPORT Performed at [facility]    Report Status PENDING  Incomplete  Culture, blood (routine x 2)     Status: None (Preliminary result)   Collection Time: 07/25/14 11:16 PM  Result Value Ref Range Status   Specimen Description BLOOD LEFT ANTECUBITAL  Final   Special Requests BOTTLES DRAWN AEROBIC ONLY 4CC  Final   Culture  Setup Time   Final    07/26/2014 11:00 Performed at [facility]     Culture   Final           BLOOD CULTURE RECEIVED NO GROWTH TO DATE CULTURE WILL BE HELD FOR 5 DAYS BEFORE ISSUING A FINAL NEGATIVE REPORT Performed at [facility]    Report Status PENDING  Incomplete     Medications:   . acetaminophen  500 mg Oral QID  . aspirin EC  81 mg Oral Daily  . digoxin  0.125 mg Oral Daily  . diltiazem  30 mg Oral 4 times per day  . ferrous fumarate  1 tablet Oral Daily  . furosemide  40 mg Intravenous BID  . hydrocortisone   Rectal TID  . hydrocortisone  25 mg Rectal BID  . levothyroxine  50 mcg Oral QAC breakfast  . metoprolol  100 mg Oral BID  . pantoprazole  40 mg Oral Daily  . potassium chloride  20 mEq Oral BID  . senna-docusate  1 tablet Oral BID  . sodium chloride  3 mL Intravenous Q12H   Continuous Infusions: . sodium chloride 50 mL/hr at 07/26/14 0840    Time spent: 25 minutes.    LOS: 4 days   [facility] Pager [phone]. If unable to reach me by pager, please call my cell phone at [phone].  *Please refer to [email or web address], password [email or web address] to get updated schedule on who will round on this patient, as hospitalists switch teams weekly. If 7PM-7AM, please contact night-coverage at [email or web address], password [email or web address] for any overnight needs.  07/29/2014, 8:15 AM

## 2014-07-30 ENCOUNTER — Encounter (HOSPITAL_COMMUNITY): Payer: Self-pay | Admitting: Interventional Cardiology

## 2014-07-30 ENCOUNTER — Inpatient Hospital Stay (HOSPITAL_COMMUNITY)

## 2014-07-30 DIAGNOSIS — C801 Malignant (primary) neoplasm, unspecified: Secondary | ICD-10-CM

## 2014-07-30 DIAGNOSIS — D72829 Elevated white blood cell count, unspecified: Secondary | ICD-10-CM | POA: Insufficient documentation

## 2014-07-30 DIAGNOSIS — I5031 Acute diastolic (congestive) heart failure: Secondary | ICD-10-CM | POA: Diagnosis present

## 2014-07-30 DIAGNOSIS — I509 Heart failure, unspecified: Secondary | ICD-10-CM | POA: Insufficient documentation

## 2014-07-30 DIAGNOSIS — I5030 Unspecified diastolic (congestive) heart failure: Secondary | ICD-10-CM

## 2014-07-30 DIAGNOSIS — C7800 Secondary malignant neoplasm of unspecified lung: Secondary | ICD-10-CM

## 2014-07-30 DIAGNOSIS — I10 Essential (primary) hypertension: Secondary | ICD-10-CM | POA: Insufficient documentation

## 2014-07-30 LAB — BASIC METABOLIC PANEL
ANION GAP: 13 (ref 5–15)
BUN: 18 mg/dL (ref 6–23)
CHLORIDE: 94 meq/L — AB (ref 96–112)
CO2: 31 mEq/L (ref 19–32)
CREATININE: 0.89 mg/dL (ref 0.50–1.10)
Calcium: 10.5 mg/dL (ref 8.4–10.5)
GFR, EST AFRICAN AMERICAN: 65 mL/min — AB (ref >90)
GFR, EST NON AFRICAN AMERICAN: 56 mL/min — AB (ref >90)
Glucose, Bld: 102 mg/dL — ABNORMAL HIGH (ref 70–99)
Potassium: 3.7 mEq/L (ref 3.7–5.3)
Sodium: 138 mEq/L (ref 137–147)

## 2014-07-30 LAB — CBC
HCT: 33.5 % — ABNORMAL LOW (ref 36.0–46.0)
HEMOGLOBIN: 10.6 g/dL — AB (ref 12.0–15.0)
MCH: 24.5 pg — ABNORMAL LOW (ref 26.0–34.0)
MCHC: 31.6 g/dL (ref 30.0–36.0)
MCV: 77.4 fL — ABNORMAL LOW (ref 78.0–100.0)
PLATELETS: 358 10*3/uL (ref 150–400)
RBC: 4.33 MIL/uL (ref 3.87–5.11)
RDW: 23.8 % — AB (ref 11.5–15.5)
WBC: 15.3 10*3/uL — ABNORMAL HIGH (ref 4.0–10.5)

## 2014-07-30 LAB — GLUCOSE, CAPILLARY: GLUCOSE-CAPILLARY: 113 mg/dL — AB (ref 70–99)

## 2014-07-30 LAB — DIFFERENTIAL
BASOS ABS: 0 10*3/uL (ref 0.0–0.1)
Basophils Relative: 0 % (ref 0–1)
Eosinophils Absolute: 0.2 10*3/uL (ref 0.0–0.7)
Eosinophils Relative: 2 % (ref 0–5)
LYMPHS ABS: 1 10*3/uL (ref 0.7–4.0)
LYMPHS PCT: 6 % — AB (ref 12–46)
Monocytes Absolute: 0.9 10*3/uL (ref 0.1–1.0)
Monocytes Relative: 6 % (ref 3–12)
NEUTROS PCT: 86 % — AB (ref 43–77)
Neutro Abs: 13.1 10*3/uL — ABNORMAL HIGH (ref 1.7–7.7)

## 2014-07-30 MED ORDER — DILTIAZEM HCL 30 MG PO TABS
60.0000 mg | ORAL_TABLET | Freq: Four times a day (QID) | ORAL | Status: DC
  Administered 2014-07-30 – 2014-08-01 (×9): 60 mg via ORAL
  Filled 2014-07-30 (×8): qty 2

## 2014-07-30 MED ORDER — METOPROLOL TARTRATE 1 MG/ML IV SOLN: 2.5000 mg | INTRAVENOUS | Status: DC | PRN

## 2014-07-30 MED ORDER — DILTIAZEM HCL 30 MG PO TABS: 30.0000 mg | ORAL_TABLET | Freq: Four times a day (QID) | ORAL | Status: DC

## 2014-07-30 NOTE — Progress Notes (Signed)
SUBJECTIVE: patient was awake, husband at bedside. Called her daughter and spoke to her on the phone.  OBJECTIVE PHYSICAL EXAMINATION: ECOG PERFORMANCE STATUS: 3 - Symptomatic, >50% confined to bed  Filed Vitals:   07/30/14 1407  BP: 135/66  Pulse: 76  Temp: 97.9 F (36.6 C)  Resp: 18   Filed Weights   07/28/14 0424 07/29/14 0536 07/30/14 0538  Weight: 125 lb (56.7 kg) 128 lb 15.5 oz (58.5 kg) 100 lb 1.4 oz (45.4 kg)    GENERAL:alert, no distress and comfortable SKIN: dry skin OROPHARYNX:no exudate, no erythema and lips, buccal mucosa, and tongue normal  LUNGS: clear to auscultation and percussion with normal breathing effort HEART: A.Fib ABDOMEN:abdomen soft, non-tender and normal bowel sounds NEURO: alert & oriented x 3 no focal motor/sensory deficits  LABORATORY DATA:  I have reviewed the data as listed @LASTCHEMISTRY @  Lab Results  Component Value Date   WBC 15.3* 07/30/2014   HGB 10.6* 07/30/2014   HCT 33.5* 07/30/2014   MCV 77.4* 07/30/2014   PLT 358 07/30/2014   NEUTROABS 13.1* 07/30/2014    ASSESSMENT AND PLAN: 1. Metastatic cancer with lung nodules, Renal cell most likely: On hospice care. Family wanted to speak to me. Arranged a meeting tomorrow at noon. 2. Agree with hospice care.

## 2014-07-30 NOTE — Plan of Care (Signed)
Problem: Phase I Progression Outcomes Goal: Anticoagulation Therapy per MD order Outcome: Completed/Met Date Met:  07/30/14     

## 2014-07-30 NOTE — Progress Notes (Signed)
Inpatient RN Amarah Brossman [facility] 4E Room 1409-[facility] RN Visit-[name] [place]land RN  Related admission to [facility] diagnosis of Metastatic Renal Ca. Pt is currently a limited code this hospitalization- No intubation/no CPR.   Pt seen at bedside, lying supine in bed, eyes closed, appeared asleep. Granddaughter Nas present during visit. Pt awakened during visit. Denied pain. Pt incontinent of urine, writer assisted Nas in changing pt gown, underpad and repositioning of pt. Pt tolerated well. Pt pleasantly confused during visit, stated that writer had been at her wedding Per chart review, discussion with staff RN Estill Bamberg and Nas, pt continues with increased heart rate, cardizem increased per cardiology consult note. She continues on 40mg   IV lasix BID for management of CHF. Nas also reports her grand mother has not slept well and was "delirious" last night.  Current plans continues to be for patient to return home to continue [facility] services.  Patient's home medication list and transfer summary in place on shadow chart.  Please call [facility] @ [phone] with any hospice needs.   Thank you. Tracey Harries, RN  [facility]  Hospice Liaison  [phone])

## 2014-07-30 NOTE — Progress Notes (Signed)
SUBJECTIVE:  No complaints except unable to sleep.  OBJECTIVE:   Vitals:   Filed Vitals:   07/29/14 2056 07/30/14 0014 07/30/14 0538 07/30/14 0835  BP: 150/76 157/89 174/54 152/75  Pulse: 92 80 76   Temp: 98.2 F (36.8 C)  97.8 F (36.6 C)   TempSrc: Oral  Oral   Resp: 20  20   Height:      Weight:   100 lb 1.4 oz (45.4 kg)   SpO2: 99%  98%    I&O's:   Intake/Output Summary (Last 24 hours) at 07/30/14 0847 Last data filed at 07/29/14 2242  Gross per 24 hour  Intake    246 ml  Output      0 ml  Net    246 ml   TELEMETRY: Reviewed telemetry pt in AFib:     PHYSICAL EXAM General: Well developed, well nourished, in no acute distress Head:   Normal cephalic and atramatic  Lungs:  No wheezing Heart:   Irregualrly irregular S1 S2  No JVD.   Abdomen: abdomen soft and non-tender Msk:  Back normal,  Normal strength and tone for age. Extremities:  No edema.   Neuro: Alert and oriented. Psych:  Normal affect, responds appropriately -per the son Skin: No rash   LABS: Basic Metabolic Panel:  Recent Labs  07/29/14 0506 07/30/14 0405  NA 133* 138  K 3.4* 3.7  CL 93* 94*  CO2 29 31  GLUCOSE 102* 102*  BUN 21 18  CREATININE 0.94 0.89  CALCIUM 9.5 10.5   Liver Function Tests: No results for input(s): AST, ALT, ALKPHOS, BILITOT, PROT, ALBUMIN in the last 72 hours. No results for input(s): LIPASE, AMYLASE in the last 72 hours. CBC:  Recent Labs  07/29/14 0506 07/30/14 0405  WBC 12.6* 15.3*  HGB 9.4* 10.6*  HCT 28.9* 33.5*  MCV 75.1* 77.4*  PLT 322 358   Cardiac Enzymes: No results for input(s): CKTOTAL, CKMB, CKMBINDEX, TROPONINI in the last 72 hours. BNP: Invalid input(s): POCBNP D-Dimer: No results for input(s): DDIMER in the last 72 hours. Hemoglobin A1C: No results for input(s): HGBA1C in the last 72 hours. Fasting Lipid Panel: No results for input(s): CHOL, HDL, LDLCALC, TRIG, CHOLHDL, LDLDIRECT in the last 72 hours. Thyroid Function  Tests: No results for input(s): TSH, T4TOTAL, T3FREE, THYROIDAB in the last 72 hours.  Invalid input(s): FREET3 Anemia Panel: No results for input(s): VITAMINB12, FOLATE, FERRITIN, TIBC, IRON, RETICCTPCT in the last 72 hours. Coag Panel:   Lab Results  Component Value Date   INR 1.26 07/25/2014   INR 1.06 07/03/2014    RADIOLOGY: Dg Chest 2 View  07/25/2014   CLINICAL DATA:  History of metastatic colonic malignancy, atrial fibrillation, and CHF; onset of tachycardia and hypertension today  EXAM: CHEST  2 VIEW  COMPARISON:  PA and lateral chest of July 13, 2014  FINDINGS: There is increased density at the right lung base consistent with increased pleural fluid. On the left there is persistent obscuration of portions of the left heart border and of the hemidiaphragm. The cardiac silhouette is enlarged. The central pulmonary vascularity is prominent. There is tortuosity of the ascending and descending thoracic aorta. There is a large hiatal hernia. The observed bony structures are unremarkable.  IMPRESSION: Increased pleural fluid collections bilaterally as well as increased pulmonary vascularity are most compatible with congestive heart failure.   Electronically Signed   By: David  Martinique   On: 07/25/2014 16:31   Dg Chest 2  View  07/13/2014   CLINICAL DATA:  Shortness of breath; history of colonic malignancy with laparoscopic left colectomy earlier this month  EXAM: CHEST  2 VIEW  COMPARISON:  Portable chest x-ray of July 03, 2014  FINDINGS: The lungs are well-expanded. The interstitial markings are increased bilaterally. There are bilateral pleural effusions greater on the left than on the right. There is obscuration of the left hemidiaphragm. The cardiopericardial silhouette is enlarged. The central pulmonary vascularity is mildly prominent. There is dense calcification in the wall of the aortic arch. There is a known large hiatal hernia-partially intra thoracic stomach contributing to the  retrocardiac density. The observed bony thorax exhibits no acute abnormality.  IMPRESSION: Congestive heart failure with pulmonary interstitial edema bilateral pleural effusions. One cannot exclude basilar pneumonia. The appearance of the chest has deteriorated since the previous study.   Electronically Signed   By: David  Martinique   On: 07/13/2014 15:03   Ct Abdomen Pelvis W Contrast  07/26/2014   CLINICAL DATA:  Abdominal distension status post partial colectomy on 07/06/2014 for newly diagnosed metastatic colon cancer.  EXAM: CT ABDOMEN AND PELVIS WITH CONTRAST  TECHNIQUE: Multidetector CT imaging of the abdomen and pelvis was performed using the standard protocol following bolus administration of intravenous contrast.  CONTRAST:  1 OMNIPAQUE IOHEXOL 300 MG/ML SOLN, 146mL OMNIPAQUE IOHEXOL 300 MG/ML SOLN  COMPARISON:  CT chest, abdomen, and pelvis 06/22/2014  FINDINGS: Small to moderate sized bilateral pleural effusions, right larger than left, are partially visualized. Compressive atelectasis is partially visualized in the lower lobes. Large hiatal hernia is partially visualized. Heart is mildly enlarged.  Low-density liver lesions measure 1.5 cm in the caudate and 1.5 cm in the inferior right hepatic lobe, unchanged and compatible with cysts. Additional subcentimeter hypodense liver lesions are again seen and are too small to fully characterize but may also represent cysts or biliary hamartomas. There is reflux of contrast into dilated suprahepatic IVC and hepatic veins.  There is new, diffuse mild to moderate gallbladder wall thickening. Mild dilatation of the common bile duct with distal tapering is similar to the prior study. There is a new 2.4 cm low-density lesion projecting from the inferior aspect of the spleen. 2.7 x 1.8 cm right adrenal mass has slightly increased in size (previously 2.3 x 1.6 cm).  4.8 x 4.7 cm upper pole left renal mass appears slightly larger (previously 4.5 x 4.2 cm). 6.0 cm right  lower pole renal cyst is unchanged. Additional, smaller low to intermediate density bilateral renal lesions do not appear significantly changed in size with most being too small to fully characterize. Mild nodularity of the left adrenal gland is unchanged without discrete mass identified. Pancreas is unremarkable.  Oral contrast is present in the multiple nondilated loops of small bowel. Sequelae of interval left hemicolectomy are identified. There is moderate gaseous distension of the proximal sigmoid and proximal transverse colon about the anastomosis. Ascending colon contains a moderate amount of stool and is nondilated. Oral contrast is present to the level of the ileocecal valve. No intraperitoneal free air is identified.  Small amount of gas is present in the left upper quadrant abdominal wall (series 2, image 28) with a small amount of surrounding fluid, however no organized fluid collection is identified to clearly indicate abscess and this may be residua from recent surgery. There is new, small volume ascites in the abdomen and pelvis. There is new diffuse anasarca. Small amount of soft tissue with calcifications in the adnexa are unchanged  and likely represent the ovaries. Small calcification associated with the right uterine body is unchanged. No enlarged lymph nodes are identified.  Superficial soft tissue stranding/thickening overlying the sacrum is similar to the prior study. No suspicious lytic or blastic osseous lesions are identified. Advanced disc degeneration is noted L4-5. Prior T12-L2 laminectomies are noted.  IMPRESSION: 1. Enlarging right adrenal mass and new splenic lesion, concerning for worsening metastatic disease. 2. Slightly increased size of left upper pole renal mass, which may reflect a primary renal cell carcinoma or a metastasis. 3. Interval left hemicolectomy. Moderate gaseous colonic distention about the anastomosis without evidence of bowel obstruction. 4. New pleural effusions,  ascites, and anasarca. 5. New gallbladder wall thickening, likely secondary to generalized fluid overload.   Electronically Signed   By: Logan Bores   On: 07/26/2014 11:32   Nm Pulmonary Perf And Vent  07/25/2014   CLINICAL DATA:  Shortness of Breath  EXAM: NUCLEAR MEDICINE VENTILATION - PERFUSION LUNG SCAN  Views: Anterior, posterior, left lateral, right lateral, RPO, LPO, RAO, LAO -ventilation and perfusion  Radionuclide: Technetium 70m DTPA -ventilation; Technetium 38m macroaggregated albumin- perfusion  Dose:  36.5 mCi-ventilation; 5.2 mCi- perfusion  Route of administration: Inhalation-ventilation; intravenous-perfusion  COMPARISON:  Chest radiograph July 25, 2014  FINDINGS: Ventilation: There is absent ventilation in the lateral right base in an area of consolidation seen on the chest radiograph. Elsewhere, the ventilation is essentially unremarkable bilaterally. Cardiomegaly is noted.  Perfusion: There is absent perfusion in the area of consolidation in the lateral right base which corresponds to an area of absent ventilation. There is cardiomegaly. Elsewhere, the perfusion appears unremarkable. No appreciable ventilation/ perfusion mismatch is seen.  IMPRESSION: There is a matching ventilation perfusion defect in the lateral right base which corresponds to an area of consolidation on the chest radiograph. This finding is in a nonsegmental distribution. There are no appreciable ventilation/perfusion mismatches. This study over all constitutes a low probability of pulmonary embolus.   Electronically Signed   By: Lowella Grip M.D.   On: 07/25/2014 20:14   Dg Chest Port 1 View  07/03/2014   CLINICAL DATA:  Anemia, colon cancer, scheduled for colostomy next week. Possible renal cancer.  EXAM: PORTABLE CHEST - 1 VIEW  COMPARISON:  CT chest dated 06/22/2014.  FINDINGS: Chronic interstitial markings. Mild bibasilar atelectasis. No focal consolidation. No pleural effusion or pneumothorax.  The heart  is top-normal in size.  Moderate hiatal hernia.  IMPRESSION: No evidence of acute cardiopulmonary disease.  Moderate hiatal hernia.   Electronically Signed   By: Julian Hy M.D.   On: 07/03/2014 19:18      ASSESSMENT: Kathyrn Lass:    Acute diastolic heart failure: Effusions on xray.  Continue Lasix.    AFib: still suboptimal rate control.  Increase cardizem to 60 mg q6 hours.  Normal EF by echo.  Jettie Booze., MD  07/30/2014  8:47 AM

## 2014-07-30 NOTE — Progress Notes (Addendum)
Progress Note   [name] JEH:[id number] DOB: [birth year] DOA: 07/25/2014 PCP: Casilda Carls, MD   Brief Narrative:   Kirsten Golden is an 80 y.o. female with a PMH of atrial fibrillation (not on anti-coagulation), CHF, HTN, hyperlipidemia, hypothyroidism, colon cancer status post partial colectomy 07/06/14 who was admitted 07/25/14 with a chief complaint of shortness of breath and altered mental status. She is under hospice care at home, and her hospice nurse directed her to the ER secondary to tachycardia.  Upon initial evaluation in the ER, the patient was noted to have a drop in her baseline hemoglobin from 8.2 on 07/14/14, to 7.4. She was also found to have a WBC of 11.6, a sodium of 119, and a chest x-ray concerning for congestive heart failure. VQ scan showed low probability of pulmonary embolism.  Assessment/Plan:   Principal Problem:   Atrial fibrillation with rapid ventricular response  The patient was admitted to the SDU and placed on a Cardizem drip.  Titrating oral Cardizem.   Had an episode of increased HR this a.m., so her Cardizem was increased to 30 mg Q 6 hours.  Home medications including metoprolol and digoxin were continued.  Digoxin level was low at 0.6 on admission.  Troponins were negative 3 sets.  TSH WNL at 3.5.  Active Problems:   Leukocytosis  WBC up.  ? Hemoconcentration versus occult infection.  CXR clearing.  Check urinalysis/culture.  May need to start empiric antibiotics if her WBC continues to rise off of diuretics.    Hypokalemia  Resolved with oral supplementation.    Anemia / GIB (gastrointestinal bleeding)  The patient's anemia is felt to be from chronic GI blood loss given her history of colon cancer as well as anemia of chronic disease.  Status post 1 unit of PRBCs.  Hemoglobin stable since blood transfusion, currently 10.6. Continue iron therapy.        Colon cancer / lung nodule  Under hospice care.  CT abdomen  shows evidence of disease progression.  No significant pain.    Hypothyroidism  Continue Synthroid.    Hypertension  Blood pressure currently stable.    CHF (congestive heart failure)  Status post 2-D echocardiogram 07/19/14, EF 55-60 percent.  Diuretics held on admission, resumed 07/27/14.  Discontinue for now, hemoconcentrated.  Unfortunately weight does not appear to be accurately recorded (28 lb weight loss in 24 hours is not reliable).    Encephalopathy  Likely secondary to metabolic causes with hyponatremia and rapid A. Fib.    Intermittently confused.    Hyponatremia  Baseline sodium 130 on 07/10/14.  Treated with 3% sodium chloride for 6 hours, sodium level 118---> 121.  Diuretics resumed 07/27/14.  Sodium normalized 07/30/14    DVT Prophylaxis  Continue SCDs.  Code Status: LCB Family Communication: Family updated at bedside. Disposition Plan: Home when stable.   IV Access:    Peripheral IV   Procedures and diagnostic studies:   Dg Chest 2 View 07/25/2014: Increased pleural fluid collections bilaterally as well as increased pulmonary vascularity are most compatible with congestive heart failure.     Nm Pulmonary Perf And Vent 07/25/2014: There is a matching ventilation perfusion defect in the lateral right base which corresponds to an area of consolidation on the chest radiograph. This finding is in a nonsegmental distribution. There are no appreciable ventilation/perfusion mismatches. This study over all constitutes a low probability of pulmonary embolus.     Ct Abdomen Pelvis W Contrast 07/26/2014: 1. Enlarging right adrenal  mass and new splenic lesion, concerning for worsening metastatic disease. 2. Slightly increased size of left upper pole renal mass, which may reflect a primary renal cell carcinoma or a metastasis. 3. Interval left hemicolectomy. Moderate gaseous colonic distention about the anastomosis without evidence of bowel obstruction. 4. New pleural  effusions, ascites, and anasarca. 5. New gallbladder wall thickening, likely secondary to generalized fluid overload.    Dg Chest Port 1 View 07/30/2014: 1. Cardiomegaly. 2. Persistent bibasilar opacities, showing slight interval improvement in aeration.     Medical Consultants:    Dr. Kirk Ruths, Cardiology  Anti-Infectives:    None.  Subjective:   Kirsten Golden was a bit more confused and restless this a.m.  No complaints of chest pain or dyspnea.  No significant cough.    Objective:    Filed Vitals:   07/29/14 1500 07/29/14 2056 07/30/14 0014 07/30/14 0538  BP: 123/62 150/76 157/89 174/54  Pulse: 72 92 80 76  Temp:  98.2 F (36.8 C)  97.8 F (36.6 C)  TempSrc:  Oral  Oral  Resp:  20  20  Height:      Weight:    45.4 kg (100 lb 1.4 oz)  SpO2:  99%  98%    Intake/Output Summary (Last 24 hours) at 07/30/14 0754 Last data filed at 07/29/14 2242  Gross per 24 hour  Intake    246 ml  Output      0 ml  Net    246 ml    Exam: Gen:  NAD Cardiovascular:  HSIR Respiratory:  Lungs CTAB Gastrointestinal:  Abdomen soft, NT/ND, + BS Extremities:  No C/E/C   Data Reviewed:    Labs: Basic Metabolic Panel:  Recent Labs Lab 07/25/14 2316 07/26/14 0420 07/27/14 0428 07/28/14 0724 07/29/14 0506 07/30/14 0405  NA 118* 121* 120* 130* 133* 138  K 4.5 4.9 3.7 3.4* 3.4* 3.7  CL 83* 87* 85* 92* 93* 94*  CO2 23 19 23 26 29 31   GLUCOSE 209* 126* 108* 106* 102* 102*  BUN 35* 36* 31* 24* 21 18  CREATININE 0.96 1.02 0.99 0.95 0.94 0.89  CALCIUM 9.4 9.2 9.4 9.9 9.5 10.5  MG 1.9  --   --   --   --   --   PHOS 2.4  --   --   --   --   --    GFR Estimated Creatinine Clearance: 26.6 mL/min (by C-G formula based on Cr of 0.89). Liver Function Tests:  Recent Labs Lab 07/25/14 1633 07/26/14 0420  AST 60* 57*  ALT 183* 155*  ALKPHOS 238* 216*  BILITOT 0.3 0.8  PROT 5.8* 5.3*  ALBUMIN 2.8* 2.6*   Coagulation profile  Recent Labs Lab 07/25/14 2316   INR 1.26    CBC:  Recent Labs Lab 07/25/14 1633  07/26/14 0420 07/26/14 1300 07/27/14 0428 07/29/14 0506 07/30/14 0405  WBC 11.6*  < > 13.2* 11.1* 13.0* 12.6* 15.3*  NEUTROABS 9.8*  --   --   --   --   --   --   HGB 7.4*  < > 8.9* 9.4* 8.8* 9.4* 10.6*  HCT 22.3*  < > 26.7* 28.2* 25.9* 28.9* 33.5*  MCV 71.0*  < > 73.2* 72.7* 72.3* 75.1* 77.4*  PLT 371  < > 363 387 339 322 358  < > = values in this interval not displayed. Cardiac Enzymes:  Recent Labs Lab 07/25/14 1633 07/25/14 2316 07/26/14 0420 07/26/14 1054  TROPONINI <0.30 <0.30 <0.30 <0.30  BNP (last 3 results)  Recent Labs  07/26/14 0420  PROBNP 18005.0*   Microbiology Recent Results (from the past 240 hour(s))  MRSA PCR Screening     Status: None   Collection Time: 07/25/14 10:56 PM  Result Value Ref Range Status   MRSA by PCR NEGATIVE NEGATIVE Final    Comment:        The GeneXpert MRSA Assay (FDA approved for NASAL specimens only), is one component of a comprehensive MRSA colonization surveillance program. It is not intended to diagnose MRSA infection nor to guide or monitor treatment for MRSA infections.   Culture, blood (routine x 2)     Status: None (Preliminary result)   Collection Time: 07/25/14 11:10 PM  Result Value Ref Range Status   Specimen Description BLOOD RIGHT ANTECUBITAL  Final   Special Requests BOTTLES DRAWN AEROBIC ONLY 4CC  Final   Culture  Setup Time   Final    07/26/2014 10:59 Performed at [facility]    Culture   Final           BLOOD CULTURE RECEIVED NO GROWTH TO DATE CULTURE WILL BE HELD FOR 5 DAYS BEFORE ISSUING A FINAL NEGATIVE REPORT Performed at [facility]    Report Status PENDING  Incomplete  Culture, blood (routine x 2)     Status: None (Preliminary result)   Collection Time: 07/25/14 11:16 PM  Result Value Ref Range Status   Specimen Description BLOOD LEFT ANTECUBITAL  Final   Special Requests BOTTLES DRAWN AEROBIC ONLY 4CC  Final    Culture  Setup Time   Final    07/26/2014 11:00 Performed at [facility]    Culture   Final           BLOOD CULTURE RECEIVED NO GROWTH TO DATE CULTURE WILL BE HELD FOR 5 DAYS BEFORE ISSUING A FINAL NEGATIVE REPORT Performed at [facility]    Report Status PENDING  Incomplete     Medications:   . acetaminophen  500 mg Oral QID  . aspirin EC  81 mg Oral Daily  . digoxin  0.125 mg Oral Daily  . diltiazem  30 mg Oral 3 times per day  . ferrous fumarate  1 tablet Oral Daily  . furosemide  40 mg Intravenous BID  . hydrocortisone   Rectal TID  . hydrocortisone  25 mg Rectal BID  . levothyroxine  50 mcg Oral QAC breakfast  . metoprolol  100 mg Oral BID  . pantoprazole  40 mg Oral Daily  . potassium chloride  20 mEq Oral TID  . senna-docusate  1 tablet Oral BID  . sodium chloride  3 mL Intravenous Q12H   Continuous Infusions: . sodium chloride 50 mL/hr at 07/26/14 0840    Time spent: 25 minutes.    LOS: 5 days   [facility] Pager [phone]. If unable to reach me by pager, please call my cell phone at [phone].  *Please refer to [email or web address], password [email or web address] to get updated schedule on who will round on this patient, as hospitalists switch teams weekly. If 7PM-7AM, please contact night-coverage at [email or web address], password [email or web address] for any overnight needs.  07/30/2014, 7:54 AM

## 2014-07-30 NOTE — Progress Notes (Signed)
PT Cancellation Note  Patient Details Name: Kirsten Golden MRN: [id number] DOB: 09-01-1924   Cancelled Treatment:    Reason Eval/Treat Not Completed: Fatigue/lethargy limiting ability to participate (family reports patient awake frequently last PM, family assisting patient to Bathroom. will check back  at another time.)   Claretha Cooper 07/30/2014, 2:27 PM Tresa Endo PT [phone]

## 2014-07-30 NOTE — Progress Notes (Signed)
[facility] MSW note: Pt is now a limited code. Met with Granddaughter from [place] and [place]. They reported that pt did not sleep last night and was "delirious" today. Pt slept during visit except when she awoken to urinate. Granddaughter assisted with bedpan. Daughter still plans to take pt home at hospital discharge with continued hospice services. Son from [place] received his visa and plans to arrive _0  23 to visit pt. Daughter-[name] working with an attorney to get another daughter that lives [place] to be able to visit soon. MSW offered support. Family agrees to hospitalization for symptom management as symptoms were not able to be controlled at home.   Kirsten Golden, MSW

## 2014-07-31 DIAGNOSIS — C189 Malignant neoplasm of colon, unspecified: Secondary | ICD-10-CM

## 2014-07-31 DIAGNOSIS — D500 Iron deficiency anemia secondary to blood loss (chronic): Secondary | ICD-10-CM

## 2014-07-31 DIAGNOSIS — I10 Essential (primary) hypertension: Secondary | ICD-10-CM

## 2014-07-31 LAB — BASIC METABOLIC PANEL
ANION GAP: 11 (ref 5–15)
BUN: 27 mg/dL — ABNORMAL HIGH (ref 6–23)
CO2: 30 meq/L (ref 19–32)
Calcium: 10.5 mg/dL (ref 8.4–10.5)
Chloride: 94 mEq/L — ABNORMAL LOW (ref 96–112)
Creatinine, Ser: 1.04 mg/dL (ref 0.50–1.10)
GFR calc Af Amer: 54 mL/min — ABNORMAL LOW (ref >90)
GFR calc non Af Amer: 47 mL/min — ABNORMAL LOW (ref >90)
GLUCOSE: 101 mg/dL — AB (ref 70–99)
Potassium: 4.5 mEq/L (ref 3.7–5.3)
SODIUM: 135 meq/L — AB (ref 137–147)

## 2014-07-31 LAB — GLUCOSE, CAPILLARY
Glucose-Capillary: 103 mg/dL — ABNORMAL HIGH (ref 70–99)
Glucose-Capillary: 91 mg/dL (ref 70–99)

## 2014-07-31 LAB — URINALYSIS, ROUTINE W REFLEX MICROSCOPIC
Bilirubin Urine: NEGATIVE
Glucose, UA: NEGATIVE mg/dL
KETONES UR: NEGATIVE mg/dL
NITRITE: NEGATIVE
PH: 6.5 (ref 5.0–8.0)
PROTEIN: NEGATIVE mg/dL
Specific Gravity, Urine: 1.013 (ref 1.005–1.030)
Urobilinogen, UA: 0.2 mg/dL (ref 0.0–1.0)

## 2014-07-31 LAB — CBC
HEMATOCRIT: 30.2 % — AB (ref 36.0–46.0)
Hemoglobin: 9.5 g/dL — ABNORMAL LOW (ref 12.0–15.0)
MCH: 24.1 pg — ABNORMAL LOW (ref 26.0–34.0)
MCHC: 31.5 g/dL (ref 30.0–36.0)
MCV: 76.6 fL — AB (ref 78.0–100.0)
Platelets: 335 10*3/uL (ref 150–400)
RBC: 3.94 MIL/uL (ref 3.87–5.11)
RDW: 24.7 % — ABNORMAL HIGH (ref 11.5–15.5)
WBC: 11.5 10*3/uL — AB (ref 4.0–10.5)

## 2014-07-31 LAB — URINE MICROSCOPIC-ADD ON

## 2014-07-31 NOTE — Progress Notes (Signed)
Inpatient RN visit- [place] 4E Room 1409 -[facility] RN Visit-[name] Highland RN  Related admission to [facility] diagnosis of  Metastatic Renal Ca. Pt is currently a limited code this hospitalization- No intubation/no CPR.  Pt seen up walking with a walker in the hallway, daughter [name] present. Pt moving well, denied pain. Walked the distance of the hallway and back. Writer assisted in getting patient settled back in bed. [name] pleased with patient's improvement.  Per chart review IV lasix to be changed to po lasix, cardizem effective in controlling heart rate, current dose is 60mg  Q 6 hrs, per cardiology note will be adjusted to long acting at discharge.  Per oncology note of 11/9, family is to have meeting with Dr. Lindi Adie today to discuss patient's metastatic cancer, family awaiting his arrival at time of visit.  No other concerns or needs expressed, spoke with staff RN Baxter Flattery, no needs at this time. [facility] will continue to follow.  Patient's home medication list and transfer summary in place on shadow chart.  Please call [facility] @ [phone]- with any hospice needs.   Thank you. Tracey Harries, RN, BSN, [place] Liaison  [phone])

## 2014-07-31 NOTE — Progress Notes (Signed)
SUBJECTIVE:  No issues. [place] some [place] the country.  Hospice following.  OBJECTIVE:   Vitals:   Filed Vitals:   07/30/14 1805 07/30/14 1807 07/30/14 2010 07/31/14 0446  BP: 122/58  128/68 157/83  Pulse: 68 78 95 78  Temp:   98 F (36.7 C) 98.2 F (36.8 C)  TempSrc:   Oral Oral  Resp:   20 20  Height:      Weight:    97 lb 9.6 oz (44.271 kg)  SpO2:   96% 98%   I&O's:    Intake/Output Summary (Last 24 hours) at 07/31/14 0646 Last data filed at 07/31/14 0446  Gross per 24 hour  Intake    240 ml  Output    150 ml  Net     90 ml   TELEMETRY: Reviewed telemetry pt in AFib- improved rate control:     PHYSICAL EXAM General: Well developed, well nourished, in no acute distress Head:   Normal cephalic and atramatic  Lungs:  No wheezing Heart:   Irregualrly irregular S1 S2  No JVD.   Abdomen: abdomen soft and non-tender Msk:  Back normal,  Normal strength and tone for age. Extremities:  No edema.   Neuro: Alert and oriented. Psych:  Normal affect, responds appropriately -per the son Skin: No rash   LABS: Basic Metabolic Panel:  Recent Labs  07/30/14 0405 07/31/14 0429  NA 138 135*  K 3.7 4.5  CL 94* 94*  CO2 31 30  GLUCOSE 102* 101*  BUN 18 27*  CREATININE 0.89 1.04  CALCIUM 10.5 10.5   Liver Function Tests: No results for input(s): AST, ALT, ALKPHOS, BILITOT, PROT, ALBUMIN in the last 72 hours. No results for input(s): LIPASE, AMYLASE in the last 72 hours. CBC:  Recent Labs  07/30/14 0405 07/30/14 1502 07/31/14 0429  WBC 15.3*  --  11.5*  NEUTROABS  --  13.1*  --   HGB 10.6*  --  9.5*  HCT 33.5*  --  30.2*  MCV 77.4*  --  76.6*  PLT 358  --  335   Cardiac Enzymes: No results for input(s): CKTOTAL, CKMB, CKMBINDEX, TROPONINI in the last 72 hours. BNP: Invalid input(s): POCBNP D-Dimer: No results for input(s): DDIMER in the last 72 hours. Hemoglobin A1C: No results for input(s): HGBA1C in the last 72 hours. Fasting  Lipid Panel: No results for input(s): CHOL, HDL, LDLCALC, TRIG, CHOLHDL, LDLDIRECT in the last 72 hours. Thyroid Function Tests: No results for input(s): TSH, T4TOTAL, T3FREE, THYROIDAB in the last 72 hours.  Invalid input(s): FREET3 Anemia Panel: No results for input(s): VITAMINB12, FOLATE, FERRITIN, TIBC, IRON, RETICCTPCT in the last 72 hours. Coag Panel:   Lab Results  Component Value Date   INR 1.26 07/25/2014   INR 1.06 07/03/2014    RADIOLOGY: Dg Chest 2 View  07/25/2014   CLINICAL DATA:  History of metastatic colonic malignancy, atrial fibrillation, and CHF; onset of tachycardia and hypertension today  EXAM: CHEST  2 VIEW  COMPARISON:  PA and lateral chest of July 13, 2014  FINDINGS: There is increased density at the right lung base consistent with increased pleural fluid. On the left there is persistent obscuration of portions of the left heart border and of the hemidiaphragm. The cardiac silhouette is enlarged. The central pulmonary vascularity is prominent. There is tortuosity of the ascending and descending thoracic aorta. There is a large hiatal hernia. The observed bony structures are unremarkable.  IMPRESSION: Increased pleural  fluid collections bilaterally as well as increased pulmonary vascularity are most compatible with congestive heart failure.   Electronically Signed   By: David  Martinique   On: 07/25/2014 16:31   Dg Chest 2 View  07/13/2014   CLINICAL DATA:  Shortness of breath; history of colonic malignancy with laparoscopic left colectomy earlier this month  EXAM: CHEST  2 VIEW  COMPARISON:  Portable chest x-ray of July 03, 2014  FINDINGS: The lungs are well-expanded. The interstitial markings are increased bilaterally. There are bilateral pleural effusions greater on the left than on the right. There is obscuration of the left hemidiaphragm. The cardiopericardial silhouette is enlarged. The central pulmonary vascularity is mildly prominent. There is dense calcification  in the wall of the aortic arch. There is a known large hiatal hernia-partially intra thoracic stomach contributing to the retrocardiac density. The observed bony thorax exhibits no acute abnormality.  IMPRESSION: Congestive heart failure with pulmonary interstitial edema bilateral pleural effusions. One cannot exclude basilar pneumonia. The appearance of the chest has deteriorated since the previous study.   Electronically Signed   By: David  Martinique   On: 07/13/2014 15:03   Ct Abdomen Pelvis W Contrast  07/26/2014   CLINICAL DATA:  Abdominal distension status post partial colectomy on 07/06/2014 for newly diagnosed metastatic colon cancer.  EXAM: CT ABDOMEN AND PELVIS WITH CONTRAST  TECHNIQUE: Multidetector CT imaging of the abdomen and pelvis was performed using the standard protocol following bolus administration of intravenous contrast.  CONTRAST:  1 OMNIPAQUE IOHEXOL 300 MG/ML SOLN, 19mL OMNIPAQUE IOHEXOL 300 MG/ML SOLN  COMPARISON:  CT chest, abdomen, and pelvis 06/22/2014  FINDINGS: Small to moderate sized bilateral pleural effusions, right larger than left, are partially visualized. Compressive atelectasis is partially visualized in the lower lobes. Large hiatal hernia is partially visualized. Heart is mildly enlarged.  Low-density liver lesions measure 1.5 cm in the caudate and 1.5 cm in the inferior right hepatic lobe, unchanged and compatible with cysts. Additional subcentimeter hypodense liver lesions are again seen and are too small to fully characterize but may also represent cysts or biliary hamartomas. There is reflux of contrast into dilated suprahepatic IVC and hepatic veins.  There is new, diffuse mild to moderate gallbladder wall thickening. Mild dilatation of the common bile duct with distal tapering is similar to the prior study. There is a new 2.4 cm low-density lesion projecting from the inferior aspect of the spleen. 2.7 x 1.8 cm right adrenal mass has slightly increased in size  (previously 2.3 x 1.6 cm).  4.8 x 4.7 cm upper pole left renal mass appears slightly larger (previously 4.5 x 4.2 cm). 6.0 cm right lower pole renal cyst is unchanged. Additional, smaller low to intermediate density bilateral renal lesions do not appear significantly changed in size with most being too small to fully characterize. Mild nodularity of the left adrenal gland is unchanged without discrete mass identified. Pancreas is unremarkable.  Oral contrast is present in the multiple nondilated loops of small bowel. Sequelae of interval left hemicolectomy are identified. There is moderate gaseous distension of the proximal sigmoid and proximal transverse colon about the anastomosis. Ascending colon contains a moderate amount of stool and is nondilated. Oral contrast is present to the level of the ileocecal valve. No intraperitoneal free air is identified.  Small amount of gas is present in the left upper quadrant abdominal wall (series 2, image 28) with a small amount of surrounding fluid, however no organized fluid collection is identified to clearly indicate abscess  and this may be residua from recent surgery. There is new, small volume ascites in the abdomen and pelvis. There is new diffuse anasarca. Small amount of soft tissue with calcifications in the adnexa are unchanged and likely represent the ovaries. Small calcification associated with the right uterine body is unchanged. No enlarged lymph nodes are identified.  Superficial soft tissue stranding/thickening overlying the sacrum is similar to the prior study. No suspicious lytic or blastic osseous lesions are identified. Advanced disc degeneration is noted L4-5. Prior T12-L2 laminectomies are noted.  IMPRESSION: 1. Enlarging right adrenal mass and new splenic lesion, concerning for worsening metastatic disease. 2. Slightly increased size of left upper pole renal mass, which may reflect a primary renal cell carcinoma or a metastasis. 3. Interval left  hemicolectomy. Moderate gaseous colonic distention about the anastomosis without evidence of bowel obstruction. 4. New pleural effusions, ascites, and anasarca. 5. New gallbladder wall thickening, likely secondary to generalized fluid overload.   Electronically Signed   By: Logan Bores   On: 07/26/2014 11:32   Nm Pulmonary Perf And Vent  07/25/2014   CLINICAL DATA:  Shortness of Breath  EXAM: NUCLEAR MEDICINE VENTILATION - PERFUSION LUNG SCAN  Views: Anterior, posterior, left lateral, right lateral, RPO, LPO, RAO, LAO -ventilation and perfusion  Radionuclide: Technetium 46m DTPA -ventilation; Technetium 50m macroaggregated albumin- perfusion  Dose:  36.5 mCi-ventilation; 5.2 mCi- perfusion  Route of administration: Inhalation-ventilation; intravenous-perfusion  COMPARISON:  Chest radiograph July 25, 2014  FINDINGS: Ventilation: There is absent ventilation in the lateral right base in an area of consolidation seen on the chest radiograph. Elsewhere, the ventilation is essentially unremarkable bilaterally. Cardiomegaly is noted.  Perfusion: There is absent perfusion in the area of consolidation in the lateral right base which corresponds to an area of absent ventilation. There is cardiomegaly. Elsewhere, the perfusion appears unremarkable. No appreciable ventilation/ perfusion mismatch is seen.  IMPRESSION: There is a matching ventilation perfusion defect in the lateral right base which corresponds to an area of consolidation on the chest radiograph. This finding is in a nonsegmental distribution. There are no appreciable ventilation/perfusion mismatches. This study over all constitutes a low probability of pulmonary embolus.   Electronically Signed   By: Lowella Grip M.D.   On: 07/25/2014 20:14   Dg Chest Port 1 View  07/30/2014   CLINICAL DATA:  History of atrial fibrillation, colon cancer, hypertension. Altered level of consciousness. Lung nodule is CHF.  EXAM: PORTABLE CHEST - 1 VIEW  COMPARISON:   07/25/2014  FINDINGS: The heart is enlarged. Aorta is tortuous and calcified. There are bilateral lower lobe opacities which obscure the hemidiaphragms. Bilateral pleural effusions are suspected, right greater than left. Overall aeration at the bases has improved slightly.  IMPRESSION: 1. Cardiomegaly. 2. Persistent bibasilar opacities, showing slight interval improvement in aeration.   Electronically Signed   By: Shon Hale M.D.   On: 07/30/2014 09:33   Dg Chest Port 1 View  07/03/2014   CLINICAL DATA:  Anemia, colon cancer, scheduled for colostomy next week. Possible renal cancer.  EXAM: PORTABLE CHEST - 1 VIEW  COMPARISON:  CT chest dated 06/22/2014.  FINDINGS: Chronic interstitial markings. Mild bibasilar atelectasis. No focal consolidation. No pleural effusion or pneumothorax.  The heart is top-normal in size.  Moderate hiatal hernia.  IMPRESSION: No evidence of acute cardiopulmonary disease.  Moderate hiatal hernia.   Electronically Signed   By: Julian Hy M.D.   On: 07/03/2014 19:18      ASSESSMENT: Kathyrn Lass:  Acute diastolic heart failure: Effusions on xray. Decrease Lasix to daily.  OK to switch to PO if ready for d/c... Likely 40-80 mg orally daily depending on weight and fluid status.    AFib: improved rate control with  Increased cardizem to 60 mg q6 hours.  Normal EF by echo.   Could send home on Cardizem CD 240 mg daily.    Not a candidate for anticoagulation due to persistent bleeding requiring transfusion.  Will sign off. Please call with questions.  Jettie Booze., MD  07/31/2014  6:46 AM

## 2014-07-31 NOTE — Progress Notes (Signed)
Patient ID: Kirsten Golden, female   DOB: June 19, 1924, 80 y.o.   MRN: [id number]  [facility] PROGRESS NOTE  Arturo Freundlich PJK:[id number] DOB: [birth year] DOA: 07/25/2014 PCP: Casilda Carls, MD   Brief Narrative:  80 y.o. female with a PMH of atrial fibrillation (not on anti-coagulation), CHF, HTN, hyperlipidemia, hypothyroidism, colon cancer status post partial colectomy 07/06/14 who was admitted 07/25/14 with shortness of breath and altered mental status. She is under hospice care at home, and her hospice nurse directed her to the ER secondary to tachycardia. Upon initial evaluation in the ER, the patient was noted to have a drop in her baseline hemoglobin from 8.2 on 07/14/14, to 7.4. She was also found to have a WBC of 11.6, a sodium of 119, and a chest x-ray concerning for congestive heart failure. VQ scan showed low probability of pulmonary embolism.  Assessment/Plan:   Principal Problem:  Atrial fibrillation with rapid ventricular response  The patient was admitted to the SDU and placed on a Cardizem drip. Titrating oral Cardizem.   Currently on Cardizem 60 mg Q6 hours and rate controlled with HR in 70's this AM  Per cardiology rec's: change to Cardizem 240 mg CD PO QD upon discharge   Home medications including metoprolol and digoxin were continued. Digoxin level was low at 0.6 on admission.  Troponins were negative 3 sets.  TSH WNL at 3.5.  No further recommendations per cardiology team, appreciate assistance   Pt not candidate for [facility] due to recurrent bleeding episodes   Active Problems:  Leukocytosis  WBC up. ? Hemoconcentration versus occult infection. CXR clearing.  WBC trending down, pt afebrile over the past 48 hours, no clear signs of an infectious etiology, no indications for ABX at this time    Hypokalemia  Resolved with oral supplementation.   Anemia / GIB (gastrointestinal bleeding)  The patient's anemia is felt to be  from chronic GI blood loss given her history of colon cancer as well as anemia of chronic disease.  Status post 1 unit of PRBCs. Hg drop over the past 24 hours: 10.6 --> 9.5 this AM  Repeat CBC in AM    Colon cancer / lung nodule  Under hospice care. CT abdomen shows evidence of disease progression. No significant pain.   Hypothyroidism  Continue Synthroid.   Hypertension  Blood pressure currently stable.   CHF (congestive heart failure)  Status post 2-D echocardiogram 07/19/14, EF 55-60 percent.  Diuretics held on admission, resumed 07/27/14.  Unfortunately weight does not appear to be accurately recorded (28 lb weight loss in past 48 hours is not reliable).  Weight is down over the past 24 hours: 100 lbs --> 97 lbs this AM  Per cardio rec's: d/c home with Lasix 40 - 80 mg PO QD based on the weight and diuresis    Encephalopathy  Likely secondary to metabolic causes with hyponatremia and rapid A. Fib.   Intermittently confused.   Hyponatremia  Baseline sodium 130 on 07/10/14. Treated with 3% sodium chloride for 6 hours, sodium level 118---> 121.  Diuretics resumed 07/27/14.  Sodium normalized 07/30/14, Na 135 this AM  Severe PCM  Secondary to progressive nature of the chronic illness, colon cancer  Advance diet as pt able to tolerate    DVT Prophylaxis  Continue SCDs.  Code Status: LCB Family Communication: Family updated at bedside. Disposition Plan: Home when stable.   IV Access:    Peripheral IV   Procedures and diagnostic studies:   Dg Chest 2 View 07/25/2014:  Increased pleural fluid collections bilaterally as well as increased pulmonary vascularity are most compatible with congestive heart failure.   Nm Pulmonary Perf And Vent 07/25/2014: There is a matching ventilation perfusion defect in the lateral right base which corresponds to an area of consolidation on the chest radiograph. This finding is in a nonsegmental  distribution. There are no appreciable ventilation/perfusion mismatches. This study over all constitutes a low probability of pulmonary embolus.   Ct Abdomen Pelvis W Contrast 07/26/2014: 1. Enlarging right adrenal mass and new splenic lesion, concerning for worsening metastatic disease. 2. Slightly increased size of left upper pole renal mass, which may reflect a primary renal cell carcinoma or a metastasis. 3. Interval left hemicolectomy. Moderate gaseous colonic distention about the anastomosis without evidence of bowel obstruction. 4. New pleural effusions, ascites, and anasarca. 5. New gallbladder wall thickening, likely secondary to generalized fluid overload.   Dg Chest Port 1 View 07/30/2014: 1. Cardiomegaly. 2. Persistent bibasilar opacities, showing slight interval improvement in aeration.   Medical Consultants:    Cardiology  Anti-Infectives:    None.  Leisa Lenz, MD  [facility] Pager [phone]  If 7PM-7AM, please contact night-coverage [email or web address] Password [email or web address] 07/31/2014, 10:59 AM   LOS: 6 days    HPI/Subjective: No acute overnight events.  Objective: Filed Vitals:   07/30/14 1805 07/30/14 1807 07/30/14 2010 07/31/14 0446  BP: 122/58  128/68 157/83  Pulse: 68 78 95 78  Temp:   98 F (36.7 C) 98.2 F (36.8 C)  TempSrc:   Oral Oral  Resp:   20 20  Height:      Weight:    44.271 kg (97 lb 9.6 oz)  SpO2:   96% 98%    Intake/Output Summary (Last 24 hours) at 07/31/14 1059 Last data filed at 07/31/14 0446  Gross per 24 hour  Intake    120 ml  Output    150 ml  Net    -30 ml    Exam:   General:  Pt is alert, follows commands appropriately, not in acute distress  Cardiovascular: Regular rate and rhythm, S1/S2, no murmurs  Respiratory: Clear to auscultation bilaterally, diminished breath sounds at bases   Abdomen: Soft, non tender, non distended, bowel sounds present  Data Reviewed: Basic Metabolic Panel:  Recent  Labs Lab 07/25/14 2316  07/27/14 0428 07/28/14 0724 07/29/14 0506 07/30/14 0405 07/31/14 0429  NA 118*  < > 120* 130* 133* 138 135*  K 4.5  < > 3.7 3.4* 3.4* 3.7 4.5  CL 83*  < > 85* 92* 93* 94* 94*  CO2 23  < > 23 26 29 31 30   GLUCOSE 209*  < > 108* 106* 102* 102* 101*  BUN 35*  < > 31* 24* 21 18 27*  CREATININE 0.96  < > 0.99 0.95 0.94 0.89 1.04  CALCIUM 9.4  < > 9.4 9.9 9.5 10.5 10.5  MG 1.9  --   --   --   --   --   --   PHOS 2.4  --   --   --   --   --   --   < > = values in this interval not displayed. Liver Function Tests:  Recent Labs Lab 07/25/14 1633 07/26/14 0420  AST 60* 57*  ALT 183* 155*  ALKPHOS 238* 216*  BILITOT 0.3 0.8  PROT 5.8* 5.3*  ALBUMIN 2.8* 2.6*   No results for input(s): LIPASE, AMYLASE in the last 168 hours. No results for input(s):  AMMONIA in the last 168 hours. CBC:  Recent Labs Lab 07/25/14 1633  07/26/14 1300 07/27/14 0428 07/29/14 0506 07/30/14 0405 07/30/14 1502 07/31/14 0429  WBC 11.6*  < > 11.1* 13.0* 12.6* 15.3*  --  11.5*  NEUTROABS 9.8*  --   --   --   --   --  13.1*  --   HGB 7.4*  < > 9.4* 8.8* 9.4* 10.6*  --  9.5*  HCT 22.3*  < > 28.2* 25.9* 28.9* 33.5*  --  30.2*  MCV 71.0*  < > 72.7* 72.3* 75.1* 77.4*  --  76.6*  PLT 371  < > 387 339 322 358  --  335  < > = values in this interval not displayed. Cardiac Enzymes:  Recent Labs Lab 07/25/14 1633 07/25/14 2316 07/26/14 0420 07/26/14 1054  TROPONINI <0.30 <0.30 <0.30 <0.30   BNP: Invalid input(s): POCBNP CBG:  Recent Labs Lab 07/27/14 2306 07/28/14 0815 07/29/14 0802 07/30/14 0806 07/31/14 0707  GLUCAP 121* 177* 96 113* 103*    Recent Results (from the past 240 hour(s))  MRSA PCR Screening     Status: None   Collection Time: 07/25/14 10:56 PM  Result Value Ref Range Status   MRSA by PCR NEGATIVE NEGATIVE Final    Comment:        The GeneXpert MRSA Assay (FDA approved for NASAL specimens only), is one component of a comprehensive MRSA  colonization surveillance program. It is not intended to diagnose MRSA infection nor to guide or monitor treatment for MRSA infections.   Culture, blood (routine x 2)     Status: None (Preliminary result)   Collection Time: 07/25/14 11:10 PM  Result Value Ref Range Status   Specimen Description BLOOD RIGHT ANTECUBITAL  Final   Special Requests BOTTLES DRAWN AEROBIC ONLY 4CC  Final   Culture  Setup Time   Final    07/26/2014 10:59 Performed at [facility]    Culture   Final           BLOOD CULTURE RECEIVED NO GROWTH TO DATE CULTURE WILL BE HELD FOR 5 DAYS BEFORE ISSUING A FINAL NEGATIVE REPORT Performed at [facility]    Report Status PENDING  Incomplete  Culture, blood (routine x 2)     Status: None (Preliminary result)   Collection Time: 07/25/14 11:16 PM  Result Value Ref Range Status   Specimen Description BLOOD LEFT ANTECUBITAL  Final   Special Requests BOTTLES DRAWN AEROBIC ONLY 4CC  Final   Culture  Setup Time   Final    07/26/2014 11:00 Performed at [facility]    Culture   Final           BLOOD CULTURE RECEIVED NO GROWTH TO DATE CULTURE WILL BE HELD FOR 5 DAYS BEFORE ISSUING A FINAL NEGATIVE REPORT Performed at [facility]    Report Status PENDING  Incomplete     Scheduled Meds: . acetaminophen  500 mg Oral QID  . aspirin EC  81 mg Oral Daily  . digoxin  0.125 mg Oral Daily  . diltiazem  60 mg Oral 4 times per day  . ferrous fumarate  1 tablet Oral Daily  . hydrocortisone   Rectal TID  . hydrocortisone  25 mg Rectal BID  . levothyroxine  50 mcg Oral QAC breakfast  . metoprolol  100 mg Oral BID  . pantoprazole  40 mg Oral Daily  . potassium chloride  20 mEq Oral TID  .  senna-docusate  1 tablet Oral BID  . sodium chloride  3 mL Intravenous Q12H   Continuous Infusions: . sodium chloride 50 mL/hr at 07/26/14 0840

## 2014-07-31 NOTE — Progress Notes (Signed)
OT Cancellation Note  Patient Details Name: Kerston Landeck MRN: [id number] DOB: 10-31-1923   Cancelled Treatment:    Reason Eval/Treat Not Completed: Other (comment)  Spoke briefly with daughter regarding role of OT but daughter had to take a phone call. Observed pt walking in hall with daughter. Will follow up to see if family feels pt needs further OT Thanks,  Betsy Pries 07/31/2014, 2:14 PM

## 2014-07-31 NOTE — Plan of Care (Signed)
Problem: Phase I Progression Outcomes Goal: If Ablation, see post EP orders Outcome: Not Applicable Date Met:  73/73/66 Goal: Other Phase I Outcomes/Goals Outcome: Not Applicable Date Met:  81/59/47  Problem: Phase III Progression Outcomes Goal: Discharge plan remains appropriate-arrangements made Outcome: Completed/Met Date Met:  07/31/14

## 2014-08-01 LAB — CULTURE, BLOOD (ROUTINE X 2)
CULTURE: NO GROWTH
Culture: NO GROWTH

## 2014-08-01 LAB — URINALYSIS, ROUTINE W REFLEX MICROSCOPIC
Bilirubin Urine: NEGATIVE
Glucose, UA: NEGATIVE mg/dL
Hgb urine dipstick: NEGATIVE
KETONES UR: NEGATIVE mg/dL
NITRITE: NEGATIVE
PH: 7 (ref 5.0–8.0)
Protein, ur: NEGATIVE mg/dL
SPECIFIC GRAVITY, URINE: 1.009 (ref 1.005–1.030)
Urobilinogen, UA: 0.2 mg/dL (ref 0.0–1.0)

## 2014-08-01 LAB — URINE MICROSCOPIC-ADD ON

## 2014-08-01 LAB — GLUCOSE, CAPILLARY: Glucose-Capillary: 89 mg/dL (ref 70–99)

## 2014-08-01 MED ORDER — LORAZEPAM 0.5 MG PO TABS: 0.5000 mg | ORAL_TABLET | Freq: Every evening | ORAL | Status: AC | PRN

## 2014-08-01 MED ORDER — HALOPERIDOL LACTATE 2 MG/ML PO CONC: 2.0000 mg | Freq: Three times a day (TID) | ORAL | Status: AC | PRN

## 2014-08-01 MED ORDER — ENSURE COMPLETE PO LIQD: 237.0000 mL | Freq: Two times a day (BID) | ORAL | Status: AC | PRN

## 2014-08-01 MED ORDER — POTASSIUM CHLORIDE CRYS ER 20 MEQ PO TBCR: 20.0000 meq | EXTENDED_RELEASE_TABLET | Freq: Every day | ORAL | Status: AC

## 2014-08-01 MED ORDER — HYDROCODONE-ACETAMINOPHEN 7.5-325 MG/15ML PO SOLN: 15.0000 mL | Freq: Four times a day (QID) | ORAL | Status: AC | PRN

## 2014-08-01 MED ORDER — ALBUTEROL SULFATE (2.5 MG/3ML) 0.083% IN NEBU: 2.5000 mg | INHALATION_SOLUTION | Freq: Four times a day (QID) | RESPIRATORY_TRACT | Status: AC | PRN

## 2014-08-01 MED ORDER — DILTIAZEM HCL ER COATED BEADS 240 MG PO TB24: 240.0000 mg | ORAL_TABLET | Freq: Every day | ORAL | Status: DC

## 2014-08-01 NOTE — Plan of Care (Signed)
Problem: Discharge Progression Outcomes Goal: INR monitor plan established Outcome: Completed/Met Date Met:  08/01/14  Problem: Consults Goal: Heart Failure Patient Education (See Patient Education module for education specifics.)  Outcome: Completed/Met Date Met:  08/01/14

## 2014-08-01 NOTE — Progress Notes (Addendum)
Inpatient RN visit-  [place] 4E Room 1409 -[facility] RN Visit-[name] Highland RN  Related admission to [facility] diagnosis of Metastatic Renal Ca. oPt is currently a limited code this hospitalization- No intubation/no CPR.  Pt seen at bedside, just back in bed after being up to [facility], assisted by staff. Patient's son present in the hallway and daughter [name] in during visit. Pt alert, appears sleepy, but agitated, her son reports she did not sleep again last night. Writer has spoken to patient daughter yesterday about trying haldol to help with night time agitation/restlessness, she declined.  [name] interprets for patient and reports that her mother continues with confusion, she requested time alone with her to calm her. Writer spoke with Staff RN Sherlynn Stalls, who confirmed plan is for patient to d/c home today. [name] aware and is in agreement with plan but wants to talk with attending Dr. Charlies Silvers. Plan is for patient to d/c via [facility]. [facility] home care team made aware.  Patient's home medication list and transfer summary in place on shadow chart.  Please call [facility] @ [phone]-  with any hospice needs.   Thank you. Tracey Harries, RN  [facility]  Hospice Liaison  [phone])

## 2014-08-01 NOTE — Plan of Care (Signed)
Problem: Discharge Progression Outcomes Goal: Sinus rate/atrial ECG rhythm with HR < 100/min Outcome: Completed/Met Date Met:  08/01/14

## 2014-08-01 NOTE — Discharge Instructions (Signed)

## 2014-08-01 NOTE — Discharge Summary (Signed)
Physician Discharge Summary  Kirsten Golden PXT:[id number] DOB: September 15, 1924 DOA: 07/25/2014  PCP: Casilda Carls, MD  Admit date: 07/25/2014 Discharge date: 08/01/2014  Recommendations for Outpatient Follow-up:  1. Continue Cardizem 240 mg once a day per cardiology recommendation. 2. Please note we did not resume Lasix. He were not on Lasix during this hospital stay. 3. In regards to potassium, please take 20 mEq once a day for next 5 days. Hospice nurse can recheck potassium level within next week to make sure it is within normal limits or if needs to be supplemented further.  Discharge Diagnoses:  Principal Problem:   Atrial fibrillation with rapid ventricular response Active Problems:   Anemia   Chronic blood loss anemia   Colon cancer   Hypothyroidism   Hypertension   CHF (congestive heart failure)   Lung nodule   Encephalopathy   Hyponatremia   Abdominal distention   Acute diastolic heart failure   Congestive heart disease   Essential hypertension   Leukocytosis    Discharge Condition: stable   Diet recommendation: as tolerated   History of present illness:  80 y.o. female with a PMH of atrial fibrillation (not on anti-coagulation), CHF, HTN, hyperlipidemia, hypothyroidism, colon cancer status post partial colectomy 07/06/14 who was admitted 07/25/14 with shortness of breath and altered mental status. She is under hospice care at home, and her hospice nurse directed her to the ER secondary to tachycardia. Upon initial evaluation in the ER, the patient was noted to have a drop in her baseline hemoglobin from 8.2 on 07/14/14, to 7.4. She was also found to have a WBC of 11.6, a sodium of 119, and a chest x-ray concerning for congestive heart failure. VQ scan showed low probability of pulmonary embolism.  Assessment/Plan:   Principal Problem:  Atrial fibrillation with rapid ventricular response  The patient was admitted to the SDU and placed on a Cardizem drip.    Per final cardiology recommendations, patient can go home with Cardizem 240 mg daily. Heart rate is still irregular but rate is controlled.  Continue digoxin daily.  Continue aspirin daily  Troponins were negative 3 sets.  TSH WNL at 3.5.  No further recommendations per cardiology team, appreciate assistance   Pt not candidate for [facility] due to recurrent bleeding episodes  Active Problems:  Leukocytosis  WBC up. ? Hemoconcentration versus occult infection. CXR clearing.  WBC trending down, pt afebrile over the past 48 hours, no clear signs of an infectious etiology, no indications for ABX at this time   Hypokalemia  Resolved with oral supplementation.   Anemia / GIB (gastrointestinal bleeding)  The patient's anemia is felt to be from chronic GI blood loss given her history of colon cancer as well as anemia of chronic disease.  Status post 1 unit of PRBCs. Hg drop over the past 24 hours: 10.6 --> 9.5.  No signs of acute bleed.    Colon cancer / lung nodule  Under hospice care. CT abdomen shows evidence of disease progression. No significant pain.   Hypothyroidism  Continue Synthroid.   Hypertension  Blood pressure currently stable.  Continue Cardizem 240 mg daily.   CHF (congestive heart failure)  Status post 2-D echocardiogram 07/19/14, EF 55-60 percent.  Diuretics held on admission, resumed 07/27/14.  Weight is down over the past 48 hours: 100 lbs --> 97 lbs this AM  Per cardio rec's: d/c home with Lasix 40 - 80 mg PO QD based on the weight and diuresis   Acute Encephalopathy  Likely secondary  to metabolic causes with hyponatremia and rapid A. Fib.   Intermittently confused.   Hyponatremia  Baseline sodium 130 on 07/10/14. Treated with 3% sodium chloride for 6 hours, sodium level 118---> 121.  Diuretics resumed 07/27/14.  Sodium normalized 07/30/14, Na 135 on 07/31/2014.  Severe PCM  Secondary to progressive nature of the  chronic illness, colon cancer  Advance diet as pt able to tolerate   DVT Prophylaxis  Continue SCDs while pt is in hospital   Code Status: LCB Family Communication: Family updated at bedside.    IV Access:    Peripheral IV   Procedures and diagnostic studies:   Dg Chest 2 View 07/25/2014: Increased pleural fluid collections bilaterally as well as increased pulmonary vascularity are most compatible with congestive heart failure.   Nm Pulmonary Perf And Vent 07/25/2014: There is a matching ventilation perfusion defect in the lateral right base which corresponds to an area of consolidation on the chest radiograph. This finding is in a nonsegmental distribution. There are no appreciable ventilation/perfusion mismatches. This study over all constitutes a low probability of pulmonary embolus.   Ct Abdomen Pelvis W Contrast 07/26/2014: 1. Enlarging right adrenal mass and new splenic lesion, concerning for worsening metastatic disease. 2. Slightly increased size of left upper pole renal mass, which may reflect a primary renal cell carcinoma or a metastasis. 3. Interval left hemicolectomy. Moderate gaseous colonic distention about the anastomosis without evidence of bowel obstruction. 4. New pleural effusions, ascites, and anasarca. 5. New gallbladder wall thickening, likely secondary to generalized fluid overload.   Dg Chest Port 1 View 07/30/2014: 1. Cardiomegaly. 2. Persistent bibasilar opacities, showing slight interval improvement in aeration.   Medical Consultants:    Cardiology  Anti-Infectives:    None.   SignedLeisa Lenz, MD  [facility] 08/01/2014, 10:40 AM  Pager #: [phone]   Discharge Exam: Filed Vitals:   08/01/14 0423  BP: 124/81  Pulse: 81  Temp: 97.9 F (36.6 C)  Resp: 18   Filed Vitals:   07/31/14 0446 07/31/14 1433 07/31/14 2015 08/01/14 0423  BP: 157/83 102/58 134/66 124/81  Pulse: 78 74 84 81  Temp:  98.2 F (36.8 C) 97.8 F (36.6 C) 98 F (36.7 C) 97.9 F (36.6 C)  TempSrc: Oral Axillary Oral Oral  Resp: 20 20 18 18   Height:      Weight: 44.271 kg (97 lb 9.6 oz)     SpO2: 98% 96% 99% 100%    General: Pt is alert, follows commands appropriately, not in acute distress Cardiovascular: irregular rhythm, rate controlled, S1/S2 appreciated Respiratory: Clear to auscultation bilaterally, no wheezing, no crackles, no rhonchi Abdominal: Soft, non tender, non distended, bowel sounds +, no guarding Extremities: trace lower extremity pitting edema, no cyanosis, pulses palpable bilaterally DP and PT Neuro: Grossly nonfocal  Discharge Instructions  Discharge Instructions    Call MD for:  difficulty breathing, headache or visual disturbances    Complete by:  As directed      Call MD for:  persistant dizziness or light-headedness    Complete by:  As directed      Call MD for:  persistant nausea and vomiting    Complete by:  As directed      Call MD for:  severe uncontrolled pain    Complete by:  As directed      Diet - low sodium heart healthy    Complete by:  As directed      Discharge instructions    Complete  by:  As directed   1. Continue Cardizem 240 mg once a day per cardiology recommendation. 2. Please note we did not resume Lasix. He were not on Lasix during this hospital stay. 3. In regards to potassium, please take 20 mEq once a day for next 5 days. Hospice nurse can recheck potassium level within next week to make sure it is within normal limits or if needs to be supplemented further.     Increase activity slowly    Complete by:  As directed             Medication List    STOP taking these medications        ciprofloxacin 500 MG tablet  Commonly known as:  CIPRO     furosemide 20 MG tablet  Commonly known as:  LASIX     triamterene-hydrochlorothiazide 50-25 MG per capsule  Commonly known as:  DYAZIDE      TAKE these medications        acetaminophen 500 MG  tablet  Commonly known as:  TYLENOL  Take 1 tablet (500 mg total) by mouth 4 (four) times daily.     albuterol (2.5 MG/3ML) 0.083% nebulizer solution  Commonly known as:  PROVENTIL  Take 3 mLs (2.5 mg total) by nebulization every 6 (six) hours as needed for wheezing or shortness of breath.     aspirin 81 MG EC tablet  Take 1 tablet (81 mg total) by mouth daily.     diclofenac sodium 1 % Gel  Commonly known as:  VOLTAREN  Apply 2 g topically 4 (four) times daily.     digoxin 0.125 MG tablet  Commonly known as:  LANOXIN  Take 0.125 mg by mouth daily.     diltiazem 240 MG 24 hr tablet  Commonly known as:  CARDIZEM LA  Take 1 tablet (240 mg total) by mouth daily.     feeding supplement (ENSURE COMPLETE) Liqd  Take 237 mLs by mouth 2 (two) times daily between meals as needed (Provide as needed).     ferrous fumarate 325 (106 FE) MG Tabs tablet  Commonly known as:  HEMOCYTE - 106 mg FE  Take 1 tablet by mouth.     haloperidol 2 MG/ML solution  Commonly known as:  HALDOL  Take 1 mL (2 mg total) by mouth every 8 (eight) hours as needed for agitation.     HYDROcodone-acetaminophen 7.5-325 mg/15 ml solution  Commonly known as:  HYCET  Take 15 mLs by mouth every 6 (six) hours as needed for moderate pain.     hydrocortisone 2.5 % rectal cream  Commonly known as:  ANUSOL-HC  Place rectally 3 (three) times daily.     hydrocortisone 25 MG suppository  Commonly known as:  ANUSOL-HC  Place 1 suppository (25 mg total) rectally 2 (two) times daily.     levothyroxine 50 MCG tablet  Commonly known as:  SYNTHROID, LEVOTHROID  Take 50 mcg by mouth daily before breakfast.     LORazepam 0.5 MG tablet  Commonly known as:  ATIVAN  Take 1 tablet (0.5 mg total) by mouth at bedtime as needed for sleep.     metoprolol 100 MG tablet  Commonly known as:  LOPRESSOR  Take 1 tablet (100 mg total) by mouth 2 (two) times daily.     pantoprazole 40 MG tablet  Commonly known as:  PROTONIX  Take 1  tablet (40 mg total) by mouth daily.     polyvinyl alcohol 1.4 % ophthalmic  solution  Commonly known as:  LIQUIFILM TEARS  Place 1 drop into both eyes as needed for dry eyes.     potassium chloride SA 20 MEQ tablet  Commonly known as:  K-DUR,KLOR-CON  Take 1 tablet (20 mEq total) by mouth daily.     senna-docusate 8.6-50 MG per tablet  Commonly known as:  Senokot-S  Take 1 tablet by mouth 2 (two) times daily.           Follow-up Information    Follow up with [facility], MD.   Specialty:  Internal Medicine   Why:  Follow up appt after recent hospitalization, As needed   Contact information:   [address] [place] [address] [phone]        The results of significant diagnostics from this hospitalization (including imaging, microbiology, ancillary and laboratory) are listed below for reference.    Significant Diagnostic Studies: Dg Chest 2 View  07/25/2014   CLINICAL DATA:  History of metastatic colonic malignancy, atrial fibrillation, and CHF; onset of tachycardia and hypertension today  EXAM: CHEST  2 VIEW  COMPARISON:  PA and lateral chest of July 13, 2014  FINDINGS: There is increased density at the right lung base consistent with increased pleural fluid. On the left there is persistent obscuration of portions of the left heart border and of the hemidiaphragm. The cardiac silhouette is enlarged. The central pulmonary vascularity is prominent. There is tortuosity of the ascending and descending thoracic aorta. There is a large hiatal hernia. The observed bony structures are unremarkable.  IMPRESSION: Increased pleural fluid collections bilaterally as well as increased pulmonary vascularity are most compatible with congestive heart failure.   Electronically Signed   By: David  Martinique   On: 07/25/2014 16:31   Dg Chest 2 View  07/13/2014   CLINICAL DATA:  Shortness of breath; history of colonic malignancy with laparoscopic left colectomy earlier this month  EXAM:  CHEST  2 VIEW  COMPARISON:  Portable chest x-ray of July 03, 2014  FINDINGS: The lungs are well-expanded. The interstitial markings are increased bilaterally. There are bilateral pleural effusions greater on the left than on the right. There is obscuration of the left hemidiaphragm. The cardiopericardial silhouette is enlarged. The central pulmonary vascularity is mildly prominent. There is dense calcification in the wall of the aortic arch. There is a known large hiatal hernia-partially intra thoracic stomach contributing to the retrocardiac density. The observed bony thorax exhibits no acute abnormality.  IMPRESSION: Congestive heart failure with pulmonary interstitial edema bilateral pleural effusions. One cannot exclude basilar pneumonia. The appearance of the chest has deteriorated since the previous study.   Electronically Signed   By: David  Martinique   On: 07/13/2014 15:03   Ct Abdomen Pelvis W Contrast  07/26/2014   CLINICAL DATA:  Abdominal distension status post partial colectomy on 07/06/2014 for newly diagnosed metastatic colon cancer.  EXAM: CT ABDOMEN AND PELVIS WITH CONTRAST  TECHNIQUE: Multidetector CT imaging of the abdomen and pelvis was performed using the standard protocol following bolus administration of intravenous contrast.  CONTRAST:  1 OMNIPAQUE IOHEXOL 300 MG/ML SOLN, 138mL OMNIPAQUE IOHEXOL 300 MG/ML SOLN  COMPARISON:  CT chest, abdomen, and pelvis 06/22/2014  FINDINGS: Small to moderate sized bilateral pleural effusions, right larger than left, are partially visualized. Compressive atelectasis is partially visualized in the lower lobes. Large hiatal hernia is partially visualized. Heart is mildly enlarged.  Low-density liver lesions measure 1.5 cm in the caudate and 1.5 cm in the inferior right  hepatic lobe, unchanged and compatible with cysts. Additional subcentimeter hypodense liver lesions are again seen and are too small to fully characterize but may also represent cysts or  biliary hamartomas. There is reflux of contrast into dilated suprahepatic IVC and hepatic veins.  There is new, diffuse mild to moderate gallbladder wall thickening. Mild dilatation of the common bile duct with distal tapering is similar to the prior study. There is a new 2.4 cm low-density lesion projecting from the inferior aspect of the spleen. 2.7 x 1.8 cm right adrenal mass has slightly increased in size (previously 2.3 x 1.6 cm).  4.8 x 4.7 cm upper pole left renal mass appears slightly larger (previously 4.5 x 4.2 cm). 6.0 cm right lower pole renal cyst is unchanged. Additional, smaller low to intermediate density bilateral renal lesions do not appear significantly changed in size with most being too small to fully characterize. Mild nodularity of the left adrenal gland is unchanged without discrete mass identified. Pancreas is unremarkable.  Oral contrast is present in the multiple nondilated loops of small bowel. Sequelae of interval left hemicolectomy are identified. There is moderate gaseous distension of the proximal sigmoid and proximal transverse colon about the anastomosis. Ascending colon contains a moderate amount of stool and is nondilated. Oral contrast is present to the level of the ileocecal valve. No intraperitoneal free air is identified.  Small amount of gas is present in the left upper quadrant abdominal wall (series 2, image 28) with a small amount of surrounding fluid, however no organized fluid collection is identified to clearly indicate abscess and this may be residua from recent surgery. There is new, small volume ascites in the abdomen and pelvis. There is new diffuse anasarca. Small amount of soft tissue with calcifications in the adnexa are unchanged and likely represent the ovaries. Small calcification associated with the right uterine body is unchanged. No enlarged lymph nodes are identified.  Superficial soft tissue stranding/thickening overlying the sacrum is similar to the  prior study. No suspicious lytic or blastic osseous lesions are identified. Advanced disc degeneration is noted L4-5. Prior T12-L2 laminectomies are noted.  IMPRESSION: 1. Enlarging right adrenal mass and new splenic lesion, concerning for worsening metastatic disease. 2. Slightly increased size of left upper pole renal mass, which may reflect a primary renal cell carcinoma or a metastasis. 3. Interval left hemicolectomy. Moderate gaseous colonic distention about the anastomosis without evidence of bowel obstruction. 4. New pleural effusions, ascites, and anasarca. 5. New gallbladder wall thickening, likely secondary to generalized fluid overload.   Electronically Signed   By: Logan Bores   On: 07/26/2014 11:32   Nm Pulmonary Perf And Vent  07/25/2014   CLINICAL DATA:  Shortness of Breath  EXAM: NUCLEAR MEDICINE VENTILATION - PERFUSION LUNG SCAN  Views: Anterior, posterior, left lateral, right lateral, RPO, LPO, RAO, LAO -ventilation and perfusion  Radionuclide: Technetium 79m DTPA -ventilation; Technetium 20m macroaggregated albumin- perfusion  Dose:  36.5 mCi-ventilation; 5.2 mCi- perfusion  Route of administration: Inhalation-ventilation; intravenous-perfusion  COMPARISON:  Chest radiograph July 25, 2014  FINDINGS: Ventilation: There is absent ventilation in the lateral right base in an area of consolidation seen on the chest radiograph. Elsewhere, the ventilation is essentially unremarkable bilaterally. Cardiomegaly is noted.  Perfusion: There is absent perfusion in the area of consolidation in the lateral right base which corresponds to an area of absent ventilation. There is cardiomegaly. Elsewhere, the perfusion appears unremarkable. No appreciable ventilation/ perfusion mismatch is seen.  IMPRESSION: There is a matching ventilation  perfusion defect in the lateral right base which corresponds to an area of consolidation on the chest radiograph. This finding is in a nonsegmental distribution. There are  no appreciable ventilation/perfusion mismatches. This study over all constitutes a low probability of pulmonary embolus.   Electronically Signed   By: Lowella Grip M.D.   On: 07/25/2014 20:14   Dg Chest Port 1 View  07/30/2014   CLINICAL DATA:  History of atrial fibrillation, colon cancer, hypertension. Altered level of consciousness. Lung nodule is CHF.  EXAM: PORTABLE CHEST - 1 VIEW  COMPARISON:  07/25/2014  FINDINGS: The heart is enlarged. Aorta is tortuous and calcified. There are bilateral lower lobe opacities which obscure the hemidiaphragms. Bilateral pleural effusions are suspected, right greater than left. Overall aeration at the bases has improved slightly.  IMPRESSION: 1. Cardiomegaly. 2. Persistent bibasilar opacities, showing slight interval improvement in aeration.   Electronically Signed   By: Shon Hale M.D.   On: 07/30/2014 09:33   Dg Chest Port 1 View  07/03/2014   CLINICAL DATA:  Anemia, colon cancer, scheduled for colostomy next week. Possible renal cancer.  EXAM: PORTABLE CHEST - 1 VIEW  COMPARISON:  CT chest dated 06/22/2014.  FINDINGS: Chronic interstitial markings. Mild bibasilar atelectasis. No focal consolidation. No pleural effusion or pneumothorax.  The heart is top-normal in size.  Moderate hiatal hernia.  IMPRESSION: No evidence of acute cardiopulmonary disease.  Moderate hiatal hernia.   Electronically Signed   By: Julian Hy M.D.   On: 07/03/2014 19:18    Microbiology: Recent Results (from the past 240 hour(s))  MRSA PCR Screening     Status: None   Collection Time: 07/25/14 10:56 PM  Result Value Ref Range Status   MRSA by PCR NEGATIVE NEGATIVE Final    Comment:        The GeneXpert MRSA Assay (FDA approved for NASAL specimens only), is one component of a comprehensive MRSA colonization surveillance program. It is not intended to diagnose MRSA infection nor to guide or monitor treatment for MRSA infections.   Culture, blood (routine x 2)      Status: None   Collection Time: 07/25/14 11:10 PM  Result Value Ref Range Status   Specimen Description BLOOD RIGHT ANTECUBITAL  Final   Special Requests BOTTLES DRAWN AEROBIC ONLY 4CC  Final   Culture  Setup Time   Final    07/26/2014 10:59 Performed at [facility]    Culture   Final    NO GROWTH 5 DAYS Performed at [facility]    Report Status 08/01/2014 FINAL  Final  Culture, blood (routine x 2)     Status: None   Collection Time: 07/25/14 11:16 PM  Result Value Ref Range Status   Specimen Description BLOOD LEFT ANTECUBITAL  Final   Special Requests BOTTLES DRAWN AEROBIC ONLY 4CC  Final   Culture  Setup Time   Final    07/26/2014 11:00 Performed at [facility]    Culture   Final    NO GROWTH 5 DAYS Performed at [facility]    Report Status 08/01/2014 FINAL  Final     Labs: Basic Metabolic Panel:  Recent Labs Lab 07/25/14 2316  07/27/14 0428 07/28/14 0724 07/29/14 0506 07/30/14 0405 07/31/14 0429  NA 118*  < > 120* 130* 133* 138 135*  K 4.5  < > 3.7 3.4* 3.4* 3.7 4.5  CL 83*  < > 85* 92* 93* 94* 94*  CO2 23  < >  23 26 29 31 30   GLUCOSE 209*  < > 108* 106* 102* 102* 101*  BUN 35*  < > 31* 24* 21 18 27*  CREATININE 0.96  < > 0.99 0.95 0.94 0.89 1.04  CALCIUM 9.4  < > 9.4 9.9 9.5 10.5 10.5  MG 1.9  --   --   --   --   --   --   PHOS 2.4  --   --   --   --   --   --   < > = values in this interval not displayed. Liver Function Tests:  Recent Labs Lab 07/25/14 1633 07/26/14 0420  AST 60* 57*  ALT 183* 155*  ALKPHOS 238* 216*  BILITOT 0.3 0.8  PROT 5.8* 5.3*  ALBUMIN 2.8* 2.6*   No results for input(s): LIPASE, AMYLASE in the last 168 hours. No results for input(s): AMMONIA in the last 168 hours. CBC:  Recent Labs Lab 07/25/14 1633  07/26/14 1300 07/27/14 0428 07/29/14 0506 07/30/14 0405 07/30/14 1502 07/31/14 0429  WBC 11.6*  < > 11.1* 13.0* 12.6* 15.3*  --  11.5*  NEUTROABS 9.8*  --   --   --   --   --   13.1*  --   HGB 7.4*  < > 9.4* 8.8* 9.4* 10.6*  --  9.5*  HCT 22.3*  < > 28.2* 25.9* 28.9* 33.5*  --  30.2*  MCV 71.0*  < > 72.7* 72.3* 75.1* 77.4*  --  76.6*  PLT 371  < > 387 339 322 358  --  335  < > = values in this interval not displayed. Cardiac Enzymes:  Recent Labs Lab 07/25/14 1633 07/25/14 2316 07/26/14 0420 07/26/14 1054  TROPONINI <0.30 <0.30 <0.30 <0.30   BNP: BNP (last 3 results)  Recent Labs  07/26/14 0420  PROBNP 18005.0*   CBG:  Recent Labs Lab 07/29/14 0802 07/30/14 0806 07/31/14 0707 07/31/14 1215 08/01/14 0813  GLUCAP 96 113* 103* 91 89    Time coordinating discharge: Over 30 minutes

## 2014-08-01 NOTE — Plan of Care (Signed)
Problem: Discharge Progression Outcomes Goal: Barriers To Progression Addressed/Resolved Outcome: Completed/Met Date Met:  08/01/14 Goal: Discharge plan in place and appropriate Outcome: Completed/Met Date Met:  08/01/14 Goal: Sinus rate/atrial ECG rhythm with HR < 100/min Outcome: Adequate for Discharge Goal: Pain controlled with appropriate interventions Outcome: Completed/Met Date Met:  08/01/14 Goal: Hemodynamically stable Outcome: Completed/Met Date Met:  99/14/44 Goal: Complications resolved/controlled Outcome: Completed/Met Date Met:  08/01/14 Goal: Tolerating diet Outcome: Completed/Met Date Met:  08/01/14 Goal: Activity appropriate for discharge plan Outcome: Completed/Met Date Met:  08/01/14 Goal: INR monitor plan established Outcome: Adequate for Discharge Goal: Other Discharge Outcomes/Goals Outcome: Completed/Met Date Met:  08/01/14

## 2014-08-01 NOTE — Progress Notes (Signed)
Discharge instructions given to pt's daughter, verbalized understanding. Left the unit in stable condition,.

## 2014-08-04 LAB — URINE CULTURE
Colony Count: >=100000
Special Requests: NORMAL

## 2014-08-06 ENCOUNTER — Ambulatory Visit: Payer: Medicaid Other | Admitting: Cardiovascular Disease

## 2014-08-07 ENCOUNTER — Encounter: Payer: Self-pay | Admitting: Cardiovascular Disease

## 2014-08-07 ENCOUNTER — Ambulatory Visit: Payer: Medicaid Other | Admitting: Cardiovascular Disease

## 2014-08-07 ENCOUNTER — Ambulatory Visit (INDEPENDENT_AMBULATORY_CARE_PROVIDER_SITE_OTHER): Payer: Medicaid Other | Admitting: Cardiovascular Disease

## 2014-08-07 ENCOUNTER — Encounter (INDEPENDENT_AMBULATORY_CARE_PROVIDER_SITE_OTHER): Payer: Self-pay

## 2014-08-07 VITALS — BP 116/68 | HR 80 | Ht <= 58 in | Wt 99.8 lb

## 2014-08-07 DIAGNOSIS — I4891 Unspecified atrial fibrillation: Secondary | ICD-10-CM

## 2014-08-07 DIAGNOSIS — I5032 Chronic diastolic (congestive) heart failure: Secondary | ICD-10-CM

## 2014-08-07 MED ORDER — HYDROCORTISONE ACETATE 25 MG RE SUPP: 25.0000 mg | Freq: Two times a day (BID) | RECTAL | Status: AC

## 2014-08-07 NOTE — Progress Notes (Signed)
Primary care physician: [name] Interpreter during this visit: [name]  HPI  This is an 80 y.o. year old female who was referred for evaluation of atrial fibrillation.  She has  significant past medical history of colon cancer. Pt is originally from [place] and speaks only farsi. She was recently diagnosed with rectal/colon cancer. Was seen by [facility] with recent biopsy. Had CT abd/pelvis and chest. Had Enhancing 4.5 cm left upper renal pole cortical mass, highly suspicious for renal cell carcinoma and pulmonary nodules presumably metastatic disease. Patient was at risk for complete colonic obstruction and underwent hemicolectomy on October 16. She had postoperative atrial fibrillation treated with diltiazem and metoprolol for rate control. She had anemia due to GI loss and was not a candidate for anticoagulation. She underwent transfusion. The patient was seen by palliative care and basically is hospice/comfort care.  recent echocardiogram  showed normal LV systolic function, moderate to severe tricuspid regurgitation with no significant pulmonary hypertension.  She was hospitalized again at [facility] for weakness. She was found to be in atrial fibrillation with rapid ventricular response. She also had severe hyponatremia with a sodium of 119. She was treated with diltiazem drip and hypertonic saline. Rate control was achieved with metoprolol, diltiazem and digoxin. The family reports increased episodes of agitation and confusion but she seems to be more comfortable than before.   No Known Allergies   Current Outpatient Prescriptions on File Prior to Visit  Medication Sig Dispense Refill  . acetaminophen (TYLENOL) 500 MG tablet Take 1 tablet (500 mg total) by mouth 4 (four) times daily.    Marland Kitchen albuterol (PROVENTIL) (2.5 MG/3ML) 0.083% nebulizer solution Take 3 mLs (2.5 mg total) by nebulization every 6 (six) hours as needed for wheezing or shortness of breath. 75 mL 0  . aspirin EC  81 MG EC tablet Take 1 tablet (81 mg total) by mouth daily.    . diclofenac sodium (VOLTAREN) 1 % GEL Apply 2 g topically 4 (four) times daily.    . digoxin (LANOXIN) 0.125 MG tablet Take 0.125 mg by mouth daily.    Marland Kitchen diltiazem (CARDIZEM LA) 240 MG 24 hr tablet Take 1 tablet (240 mg total) by mouth daily. 30 tablet 0  . feeding supplement, ENSURE COMPLETE, (ENSURE COMPLETE) LIQD Take 237 mLs by mouth 2 (two) times daily between meals as needed (Provide as needed). 237 mL 0  . ferrous fumarate (HEMOCYTE - 106 MG FE) 325 (106 FE) MG TABS tablet Take 1 tablet by mouth.    . haloperidol (HALDOL) 2 MG/ML solution Take 1 mL (2 mg total) by mouth every 8 (eight) hours as needed for agitation. 15 mL 0  . HYDROcodone-acetaminophen (HYCET) 7.5-325 mg/15 ml solution Take 15 mLs by mouth every 6 (six) hours as needed for moderate pain. 45 mL 0  . hydrocortisone (ANUSOL-HC) 2.5 % rectal cream Place rectally 3 (three) times daily. 30 g 0  . levothyroxine (SYNTHROID, LEVOTHROID) 50 MCG tablet Take 50 mcg by mouth daily before breakfast.    . LORazepam (ATIVAN) 0.5 MG tablet Take 1 tablet (0.5 mg total) by mouth at bedtime as needed for sleep. 10 tablet 0  . metoprolol (LOPRESSOR) 100 MG tablet Take 1 tablet (100 mg total) by mouth 2 (two) times daily. 60 tablet 6  . pantoprazole (PROTONIX) 40 MG tablet Take 1 tablet (40 mg total) by mouth daily. 30 tablet 1  . polyvinyl alcohol (LIQUIFILM TEARS) 1.4 % ophthalmic solution Place 1 drop into both eyes as  needed for dry eyes.    . potassium chloride SA (K-DUR,KLOR-CON) 20 MEQ tablet Take 1 tablet (20 mEq total) by mouth daily. 5 tablet 0  . senna-docusate (SENOKOT-S) 8.6-50 MG per tablet Take 1 tablet by mouth 2 (two) times daily. 60 tablet 1  . nitrofurantoin, macrocrystal-monohydrate, (MACROBID) 100 MG capsule Take 1 capsule (100 mg total) by mouth 2 (two) times daily. 14 capsule 0   No current facility-administered medications on file prior to visit.     Past  Medical History  Diagnosis Date  . Colon cancer   . Allergy   . Constipation   . Frequent urination   . Hypothyroidism   . Vitamin D deficiency   . Hyperparathyroidism   . Hypertension   . Pure hypercholesterolemia   . Osteoporosis   . Chronic renal insufficiency   . Atrial fibrillation   . CHF (congestive heart failure)   . Lung nodule   . Acute diastolic heart failure      Past Surgical History  Procedure Laterality Date  . Laparoscopic partial colectomy N/A 07/06/2014    Procedure: LAPAROSCOPIC ASSISTED TRANSVERSE COLECTOMY;  Surgeon: Pedro Earls, MD;  Location: [facility]S;  Service: General;  Laterality: N/A;  . Tumor surgery       Family History  Problem Relation Age of Onset  . Family history unknown: Yes     History   Social History  . Marital Status: Widowed    Spouse Name: N/A    Number of Children: N/A  . Years of Education: N/A   Occupational History  . Not on file.   Social History Main Topics  . Smoking status: Never Smoker   . Smokeless tobacco: Not on file  . Alcohol Use: No  . Drug Use: No  . Sexual Activity: Not on file   Other Topics Concern  . Not on file   Social History Narrative     ROS A 10 point review of system was performed. It is negative other than that mentioned in the history of present illness.   PHYSICAL EXAM   BP 116/68 mmHg  Pulse 80  Ht 4\' 9"  (1.448 m)  Wt 99 lb 12.8 oz (45.269 kg)  BMI 21.59 kg/m2 Constitutional: She is oriented to person, place, and time. She appears frail. No distress.  HENT: No nasal discharge.  Head: Normocephalic and atraumatic.  Eyes: Pupils are equal and round. No discharge.  Neck: Normal range of motion. Neck supple. No JVD present. No thyromegaly present.  Cardiovascular: Normal rate, irregular rhythm, normal heart sounds. Exam reveals no gallop and no friction rub. No murmur heard.  Pulmonary/Chest: Effort normal and breath sounds normal. No stridor. No respiratory distress.  She has no wheezes. She has no rales. She exhibits no tenderness.  Abdominal: Soft. Bowel sounds are normal. She exhibits no distension. There is no tenderness. There is no rebound and no guarding.  Musculoskeletal: Normal range of motion. She exhibits no edema and no tenderness.  Neurological: She is alert and oriented to person, place, and time. Coordination normal.  Skin: Skin is warm and dry. No rash noted. She is not diaphoretic. No erythema. No pallor.  Psychiatric: She has a normal mood and affect. Her behavior is normal. Judgment and thought content normal.       ASSESSMENT AND PLAN

## 2014-08-07 NOTE — Patient Instructions (Signed)
Your physician recommends that you have labs today: CBC  BMP Dig level   Your physician recommends that you schedule a follow-up appointment in:  2 weeks with Dr. Fletcher Anon

## 2014-08-08 MED ORDER — NITROFURANTOIN MONOHYD MACRO 100 MG PO CAPS: 100.0000 mg | ORAL_CAPSULE | Freq: Two times a day (BID) | ORAL | Status: AC

## 2014-08-09 ENCOUNTER — Telehealth: Payer: Self-pay | Admitting: Cardiovascular Disease

## 2014-08-09 DIAGNOSIS — N390 Urinary tract infection, site not specified: Secondary | ICD-10-CM

## 2014-08-09 LAB — DIGOXIN LEVEL: Digoxin Level: 1 ng/mL (ref 0.9–2.0)

## 2014-08-09 LAB — CBC WITH DIFFERENTIAL
BASOS ABS: 0.1 10*3/uL (ref 0.0–0.2)
Basos: 1 %
EOS ABS: 0.2 10*3/uL (ref 0.0–0.4)
Eos: 3 %
HEMATOCRIT: 33.8 % — AB (ref 34.0–46.6)
Hemoglobin: 10.8 g/dL — ABNORMAL LOW (ref 11.1–15.9)
Immature Grans (Abs): 0 10*3/uL (ref 0.0–0.1)
Immature Granulocytes: 0 %
LYMPHS ABS: 1.1 10*3/uL (ref 0.7–3.1)
Lymphs: 12 %
MCH: 25 pg — ABNORMAL LOW (ref 26.6–33.0)
MCHC: 32 g/dL (ref 31.5–35.7)
MCV: 78 fL — ABNORMAL LOW (ref 79–97)
Monocytes Absolute: 0.9 10*3/uL (ref 0.1–0.9)
Monocytes: 9 %
NEUTROS ABS: 7.5 10*3/uL — AB (ref 1.4–7.0)
Neutrophils Relative %: 75 %
Platelets: 339 10*3/uL (ref 150–379)
RBC: 4.32 x10E6/uL (ref 3.77–5.28)
RDW: 24.4 % — ABNORMAL HIGH (ref 12.3–15.4)
WBC: 9.7 10*3/uL (ref 3.4–10.8)

## 2014-08-09 LAB — BASIC METABOLIC PANEL
BUN/Creatinine Ratio: 23 (ref 11–26)
BUN: 26 mg/dL (ref 8–27)
CALCIUM: 10.7 mg/dL — AB (ref 8.7–10.3)
CHLORIDE: 99 mmol/L (ref 97–108)
CO2: 24 mmol/L (ref 18–29)
Creatinine, Ser: 1.12 mg/dL — ABNORMAL HIGH (ref 0.57–1.00)
GFR calc Af Amer: 51 mL/min/{1.73_m2} — ABNORMAL LOW (ref >59)
GFR, EST NON AFRICAN AMERICAN: 44 mL/min/{1.73_m2} — AB (ref >59)
Glucose: 116 mg/dL — ABNORMAL HIGH (ref 65–99)
POTASSIUM: 5.3 mmol/L — AB (ref 3.5–5.2)
SODIUM: 138 mmol/L (ref 134–144)

## 2014-08-09 LAB — SPECIMEN STATUS REPORT

## 2014-08-09 NOTE — Progress Notes (Signed)
Informed by lab that Urine culture growing Enterococcus species. I spoke with ID on call and while it is not an imperative to treat since patient really did not have symptoms of UTI i spoke with the daughter and decided to go ahead and treat with Macrobid (UCx sens to Macrobid) for 14 days. Patient's pharmacy called. Leisa Lenz [facility] [phone]

## 2014-08-09 NOTE — Telephone Encounter (Signed)
[facility] got blood but did not get orders please call back .

## 2014-08-10 NOTE — Assessment & Plan Note (Signed)
She appears to be euvolemic without a diuretic. The family reporting frequent diarrhea and thus we should avoid diuretics. Check basic metabolic profile to follow on hyponatremia and evaluate kidney function.

## 2014-08-10 NOTE — Assessment & Plan Note (Signed)
Ventricular rate is now reasonably controlled on diltiazem, metoprolol and digoxin. I requested routine labs including CBC, basic metabolic profile and digoxin level. She is not a candidate for anticoagulation due to GI blood loss and anemia. Family are asking if the patient can fly to [place] where she wishes to be buried. I will reevaluate her symptoms in 2 weeks. The patient continues to be under hospice care.

## 2014-08-24 ENCOUNTER — Ambulatory Visit (INDEPENDENT_AMBULATORY_CARE_PROVIDER_SITE_OTHER): Admitting: Cardiovascular Disease

## 2014-08-24 ENCOUNTER — Encounter: Payer: Self-pay | Admitting: Cardiovascular Disease

## 2014-08-24 VITALS — BP 130/90 | HR 85 | Ht <= 58 in | Wt 96.0 lb

## 2014-08-24 DIAGNOSIS — I5032 Chronic diastolic (congestive) heart failure: Secondary | ICD-10-CM

## 2014-08-24 DIAGNOSIS — I482 Chronic atrial fibrillation, unspecified: Secondary | ICD-10-CM

## 2014-08-24 DIAGNOSIS — I10 Essential (primary) hypertension: Secondary | ICD-10-CM

## 2014-08-24 DIAGNOSIS — I4891 Unspecified atrial fibrillation: Secondary | ICD-10-CM

## 2014-08-24 MED ORDER — SENNOSIDES-DOCUSATE SODIUM 8.6-50 MG PO TABS: 1.0000 | ORAL_TABLET | Freq: Three times a day (TID) | ORAL | Status: AC

## 2014-08-24 NOTE — Addendum Note (Signed)
Addended by: Tracie Harrier on: 08/24/2014 03:44 PM   Modules accepted: Medications

## 2014-08-24 NOTE — Progress Notes (Signed)
Primary care physician: [name]   HPI  This is an 80 y.o. year old female who was referred for evaluation of atrial fibrillation.  She has  significant past medical history of colon cancer. Pt is originally from [place] and speaks only farsi. She was recently diagnosed with rectal/colon cancer. Was seen by [facility] with recent biopsy. Had CT abd/pelvis and chest. Had Enhancing 4.5 cm left upper renal pole cortical mass, highly suspicious for renal cell carcinoma and pulmonary nodules presumably metastatic disease. Patient was at risk for complete colonic obstruction and underwent hemicolectomy on October 16. She had postoperative atrial fibrillation treated with diltiazem and metoprolol for rate control. She had anemia due to GI loss and was not a candidate for anticoagulation. She underwent transfusion. The patient was seen by palliative care and basically is hospice/comfort care.  recent echocardiogram  showed normal LV systolic function, moderate to severe tricuspid regurgitation with no significant pulmonary hypertension.  She was hospitalized again at [facility] for weakness. She was found to be in atrial fibrillation with rapid ventricular response. She also had severe hyponatremia with a sodium of 119. She was treated with diltiazem drip and hypertonic saline. Rate control was achieved with metoprolol, diltiazem and digoxin.  She has been doing reasonably well with less agitation and confusion. She has chronic constipation treated with Senokot.   No Known Allergies   Current Outpatient Prescriptions on File Prior to Visit  Medication Sig Dispense Refill  . acetaminophen (TYLENOL) 500 MG tablet Take 1 tablet (500 mg total) by mouth 4 (four) times daily.    Marland Kitchen albuterol (PROVENTIL) (2.5 MG/3ML) 0.083% nebulizer solution Take 3 mLs (2.5 mg total) by nebulization every 6 (six) hours as needed for wheezing or shortness of breath. 75 mL 0  . aspirin EC 81 MG EC tablet Take 1 tablet (81 mg  total) by mouth daily.    . diclofenac sodium (VOLTAREN) 1 % GEL Apply 2 g topically 4 (four) times daily.    . digoxin (LANOXIN) 0.125 MG tablet Take 0.125 mg by mouth daily.    . feeding supplement, ENSURE COMPLETE, (ENSURE COMPLETE) LIQD Take 237 mLs by mouth 2 (two) times daily between meals as needed (Provide as needed). 237 mL 0  . ferrous fumarate (HEMOCYTE - 106 MG FE) 325 (106 FE) MG TABS tablet Take 1 tablet by mouth.    . haloperidol (HALDOL) 2 MG/ML solution Take 1 mL (2 mg total) by mouth every 8 (eight) hours as needed for agitation. 15 mL 0  . HYDROcodone-acetaminophen (HYCET) 7.5-325 mg/15 ml solution Take 15 mLs by mouth every 6 (six) hours as needed for moderate pain. 45 mL 0  . hydrocortisone (ANUSOL-HC) 2.5 % rectal cream Place rectally 3 (three) times daily. 30 g 0  . hydrocortisone (ANUSOL-HC) 25 MG suppository Place 1 suppository (25 mg total) rectally 2 (two) times daily. 12 suppository 0  . levothyroxine (SYNTHROID, LEVOTHROID) 50 MCG tablet Take 50 mcg by mouth daily before breakfast.    . LORazepam (ATIVAN) 0.5 MG tablet Take 1 tablet (0.5 mg total) by mouth at bedtime as needed for sleep. (Patient taking differently: Take 0.5 mg by mouth 4 (four) times daily as needed for sleep. ) 10 tablet 0  . metoprolol (LOPRESSOR) 100 MG tablet Take 1 tablet (100 mg total) by mouth 2 (two) times daily. 60 tablet 6  . nitrofurantoin, macrocrystal-monohydrate, (MACROBID) 100 MG capsule Take 1 capsule (100 mg total) by mouth 2 (two) times daily. 14 capsule 0  .  pantoprazole (PROTONIX) 40 MG tablet Take 1 tablet (40 mg total) by mouth daily. 30 tablet 1  . polyvinyl alcohol (LIQUIFILM TEARS) 1.4 % ophthalmic solution Place 1 drop into both eyes as needed for dry eyes.    . potassium chloride SA (K-DUR,KLOR-CON) 20 MEQ tablet Take 1 tablet (20 mEq total) by mouth daily. 5 tablet 0  . senna-docusate (SENOKOT-S) 8.6-50 MG per tablet Take 1 tablet by mouth 2 (two) times daily. 60 tablet 1    No current facility-administered medications on file prior to visit.     Past Medical History  Diagnosis Date  . Colon cancer   . Allergy   . Constipation   . Frequent urination   . Hypothyroidism   . Vitamin D deficiency   . Hyperparathyroidism   . Hypertension   . Pure hypercholesterolemia   . Osteoporosis   . Chronic renal insufficiency   . Atrial fibrillation   . CHF (congestive heart failure)   . Lung nodule   . Acute diastolic heart failure      Past Surgical History  Procedure Laterality Date  . Laparoscopic partial colectomy N/A 07/06/2014    Procedure: LAPAROSCOPIC ASSISTED TRANSVERSE COLECTOMY;  Surgeon: Pedro Earls, MD;  Location: [facility];  Service: General;  Laterality: N/A;  . Tumor surgery       Family History  Problem Relation Age of Onset  . Family history unknown: Yes     History   Social History  . Marital Status: Widowed    Spouse Name: N/A    Number of Children: N/A  . Years of Education: N/A   Occupational History  . Not on file.   Social History Main Topics  . Smoking status: Never Smoker   . Smokeless tobacco: Not on file  . Alcohol Use: No  . Drug Use: No  . Sexual Activity: Not on file   Other Topics Concern  . Not on file   Social History Narrative     ROS A 10 point review of system was performed. It is negative other than that mentioned in the history of present illness.   PHYSICAL EXAM   BP 130/90 mmHg  Pulse 85  Ht 4\' 9"  (1.448 m)  Wt 96 lb (43.545 kg)  BMI 20.77 kg/m2 Constitutional: She is oriented to person, place, and time. She appears frail. No distress.  HENT: No nasal discharge.  Head: Normocephalic and atraumatic.  Eyes: Pupils are equal and round. No discharge.  Neck: Normal range of motion. Neck supple. No JVD present. No thyromegaly present.  Cardiovascular: Normal rate, irregular rhythm, normal heart sounds. Exam reveals no gallop and no friction rub. No murmur heard.  Pulmonary/Chest:  Effort normal and breath sounds normal. No stridor. No respiratory distress. She has no wheezes. She has no rales. She exhibits no tenderness.  Abdominal: Soft. Bowel sounds are normal. She exhibits no distension. There is no tenderness. There is no rebound and no guarding.  Musculoskeletal: Normal range of motion. She exhibits no edema and no tenderness.  Neurological: She is alert and oriented to person, place, and time. Coordination normal.  Skin: Skin is warm and dry. No rash noted. She is not diaphoretic. No erythema. No pallor.  Psychiatric: She has a normal mood and affect. Her behavior is normal. Judgment and thought content normal.    EKG: Atrial flutter-fibrillation  -Anteroseptal infarct -age undetermined.   -Nonspecific ST depression  -Nondiagnostic.   ABNORMAL    ASSESSMENT AND PLAN

## 2014-08-24 NOTE — Assessment & Plan Note (Signed)
She appears to be euvolemic without any diuretics. Check routine labs today.

## 2014-08-24 NOTE — Assessment & Plan Note (Signed)
Ventricular rate is well controlled on current medications. I am going to check digoxin level to ensure safety of continuing this medication. She is flying to [place] next week in order to spend the end of life at her home country.

## 2014-08-24 NOTE — Assessment & Plan Note (Signed)
Blood pressure is controlled on current medications. 

## 2014-08-24 NOTE — Patient Instructions (Signed)
Your physician has recommended you make the following change in your medication:  Increase Senokot to three times daily   Your physician recommends that you have labs today: CBC  BMP Digoxin level   Please take lab orders to the registration desk in the [place] at [facility]

## 2016-03-17 IMAGING — CR DG ABDOMEN ACUTE W/ 1V CHEST
3 series · 3 of 3 positions shown · non-contrast
Comparison: None.

CLINICAL DATA: Constipation and bloating ; heme-positive stool

EXAM:
ACUTE ABDOMEN SERIES (ABDOMEN 2 VIEW & CHEST 1 VIEW)

[w chest pa]
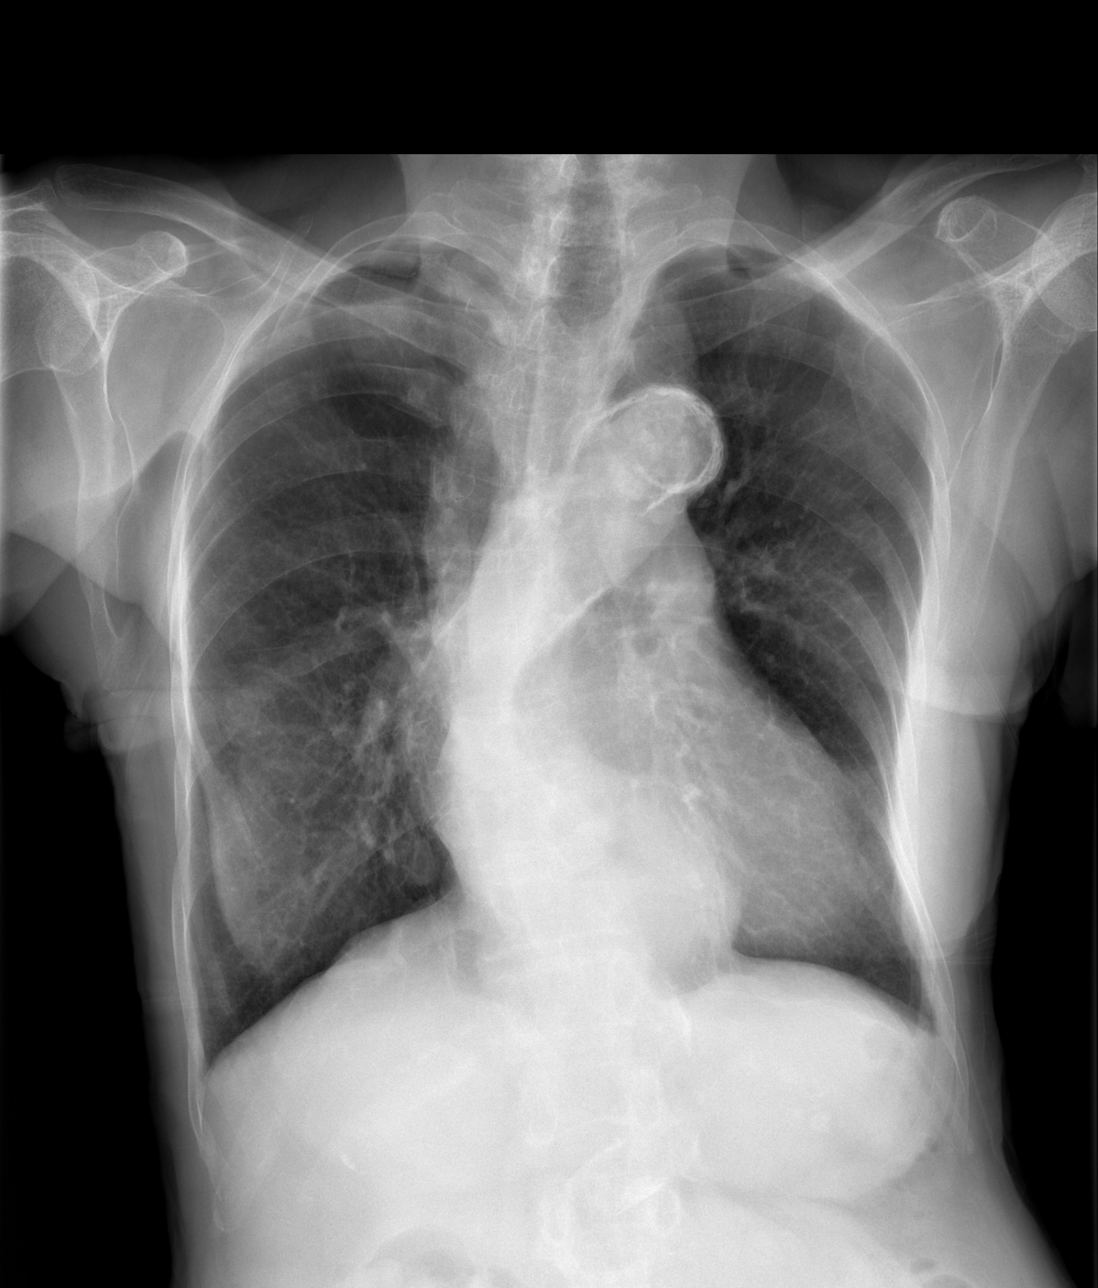

[w abdomen upright *]
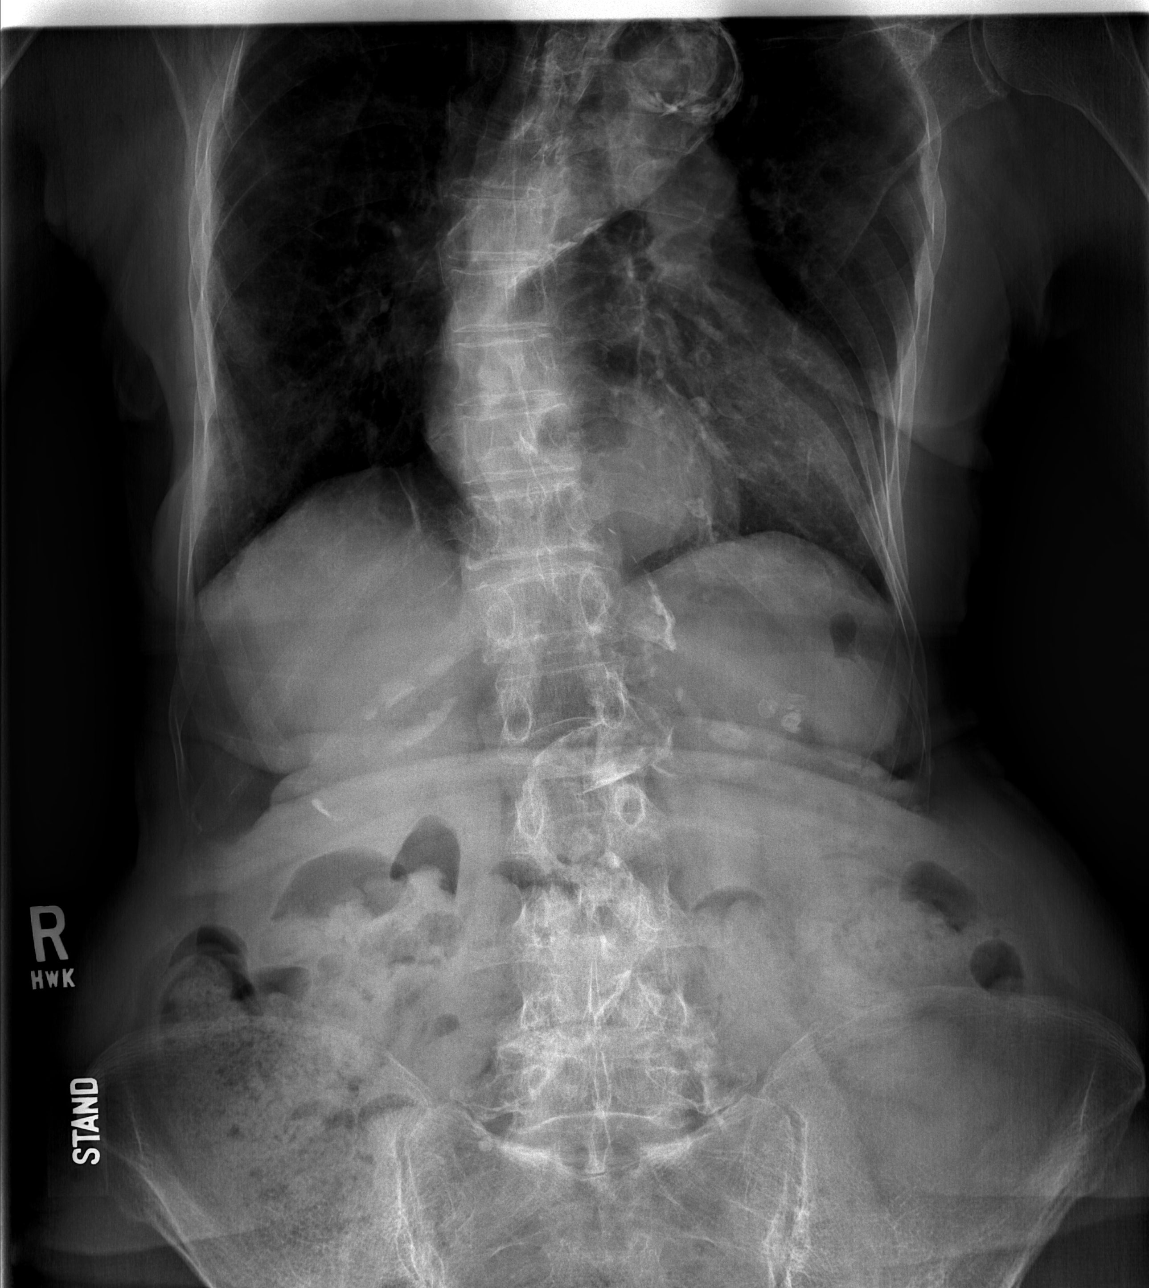

[t abdomen supine]
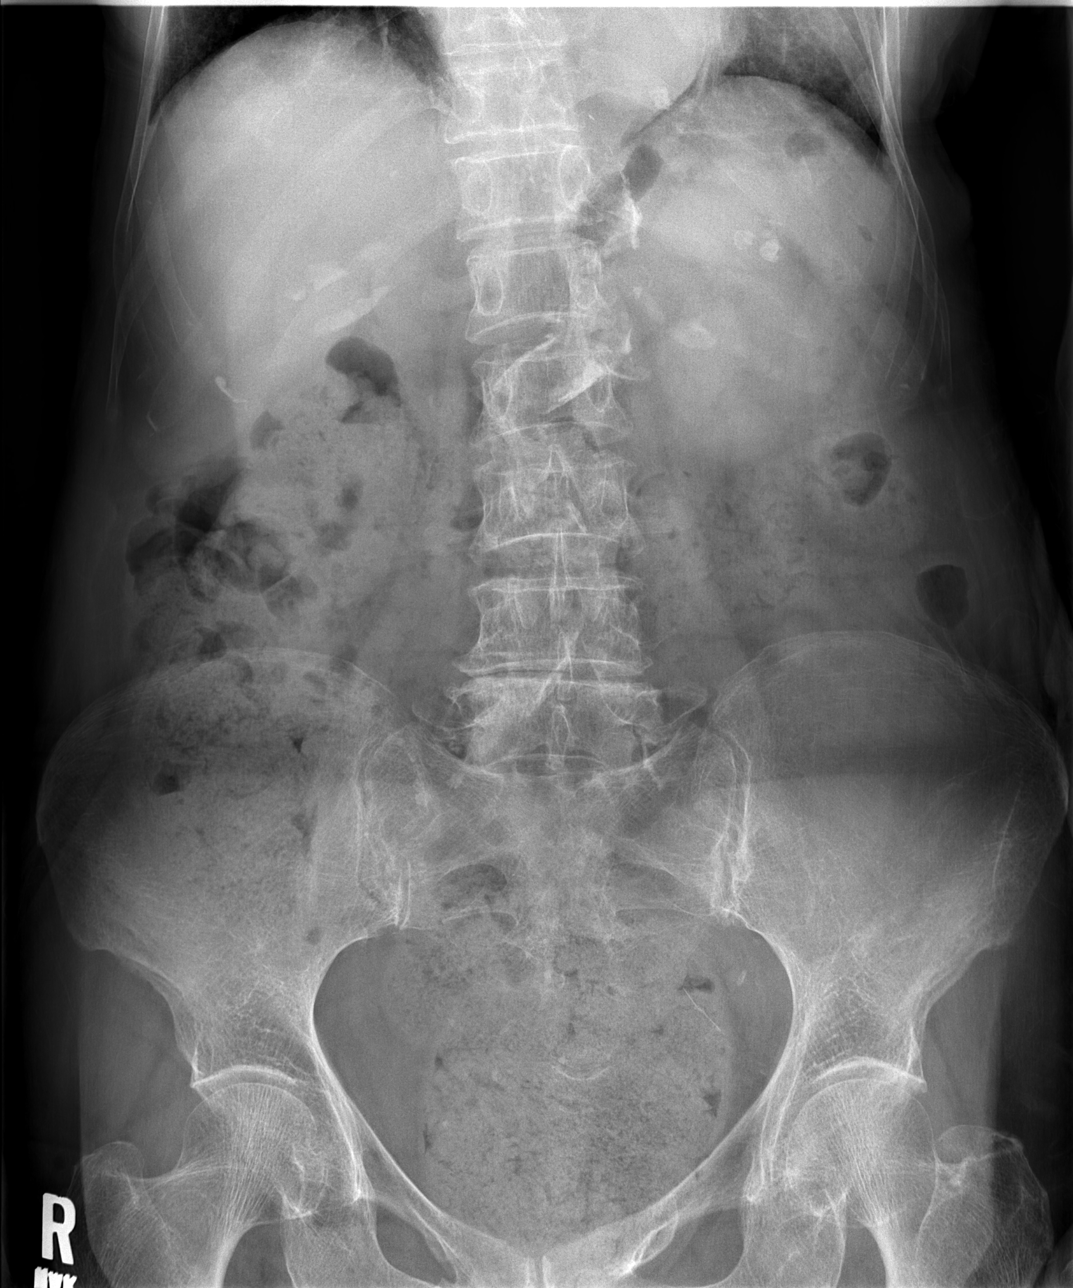

[3 of 3 positions shown; findings below may reference images not displayed]

FINDINGS: PA chest: There is underlying emphysema. There is no edema or
consolidation. There is a nodular opacity in the medial right base
region measuring 1.7 x 1.2 cm. Elsewhere lungs are clear.

Heart is mildly enlarged with markedly tortuous aorta. There is
atherosclerotic change no. There is a moderate hiatal hernia. No
adenopathy. Bones are osteoporotic. There is calcification in the
right carotid artery.

Supine and upright abdomen: There is extensive stool throughout
colon. The rectum and distal sigmoid colon are distended with stool.
There is no bowel dilatation or air-fluid level suggesting
obstruction. No free air. There is atherosclerotic calcification in
the aorta. There are phleboliths in the pelvis.
IMPRESSION: Extensive stool throughout the colon with distention of the rectum
and sigmoid colon with stool. No obstruction.

Moderate hiatal hernia.

Underlying emphysema.  No edema or consolidation.

Extensive atherosclerotic change.

Nodular opacity either in or overlying the right base region
measuring 1.7 x 1.2 cm. Noncontrast enhanced chest CT advised to
further assess.

These results will be called to the ordering clinician or
representative by the Radiologist Assistant, and communication
documented in the PACS or zVision Dashboard.

## 2016-04-17 IMAGING — CR DG CHEST 2V
2 series · 2 of 2 positions shown · non-contrast
Comparison: Portable chest x-ray July 03, 2014

CLINICAL DATA: Shortness of breath; history of colonic malignancy
with laparoscopic left colectomy earlier this month

EXAM:
CHEST  2 VIEW

[w chest lat]
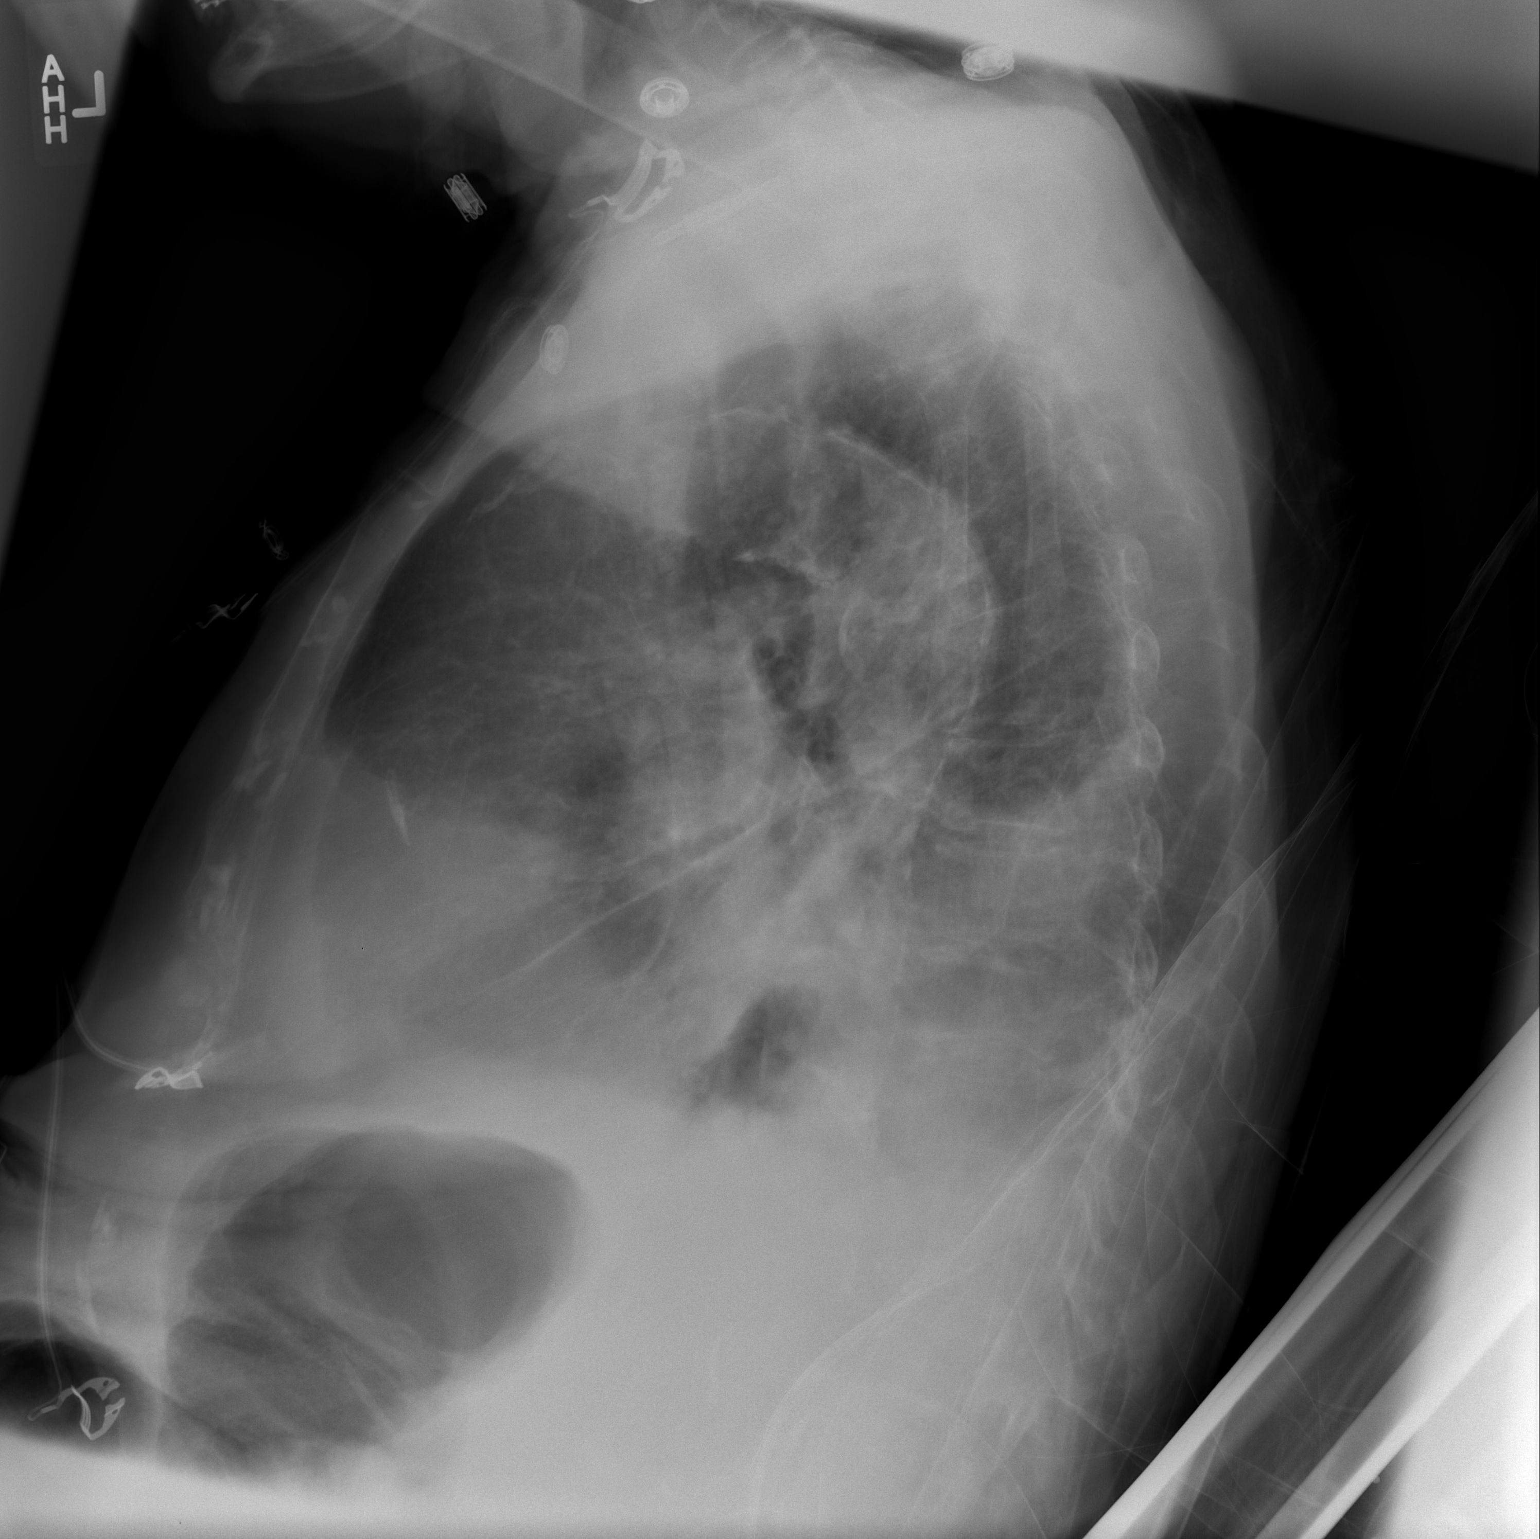

[view not recorded]
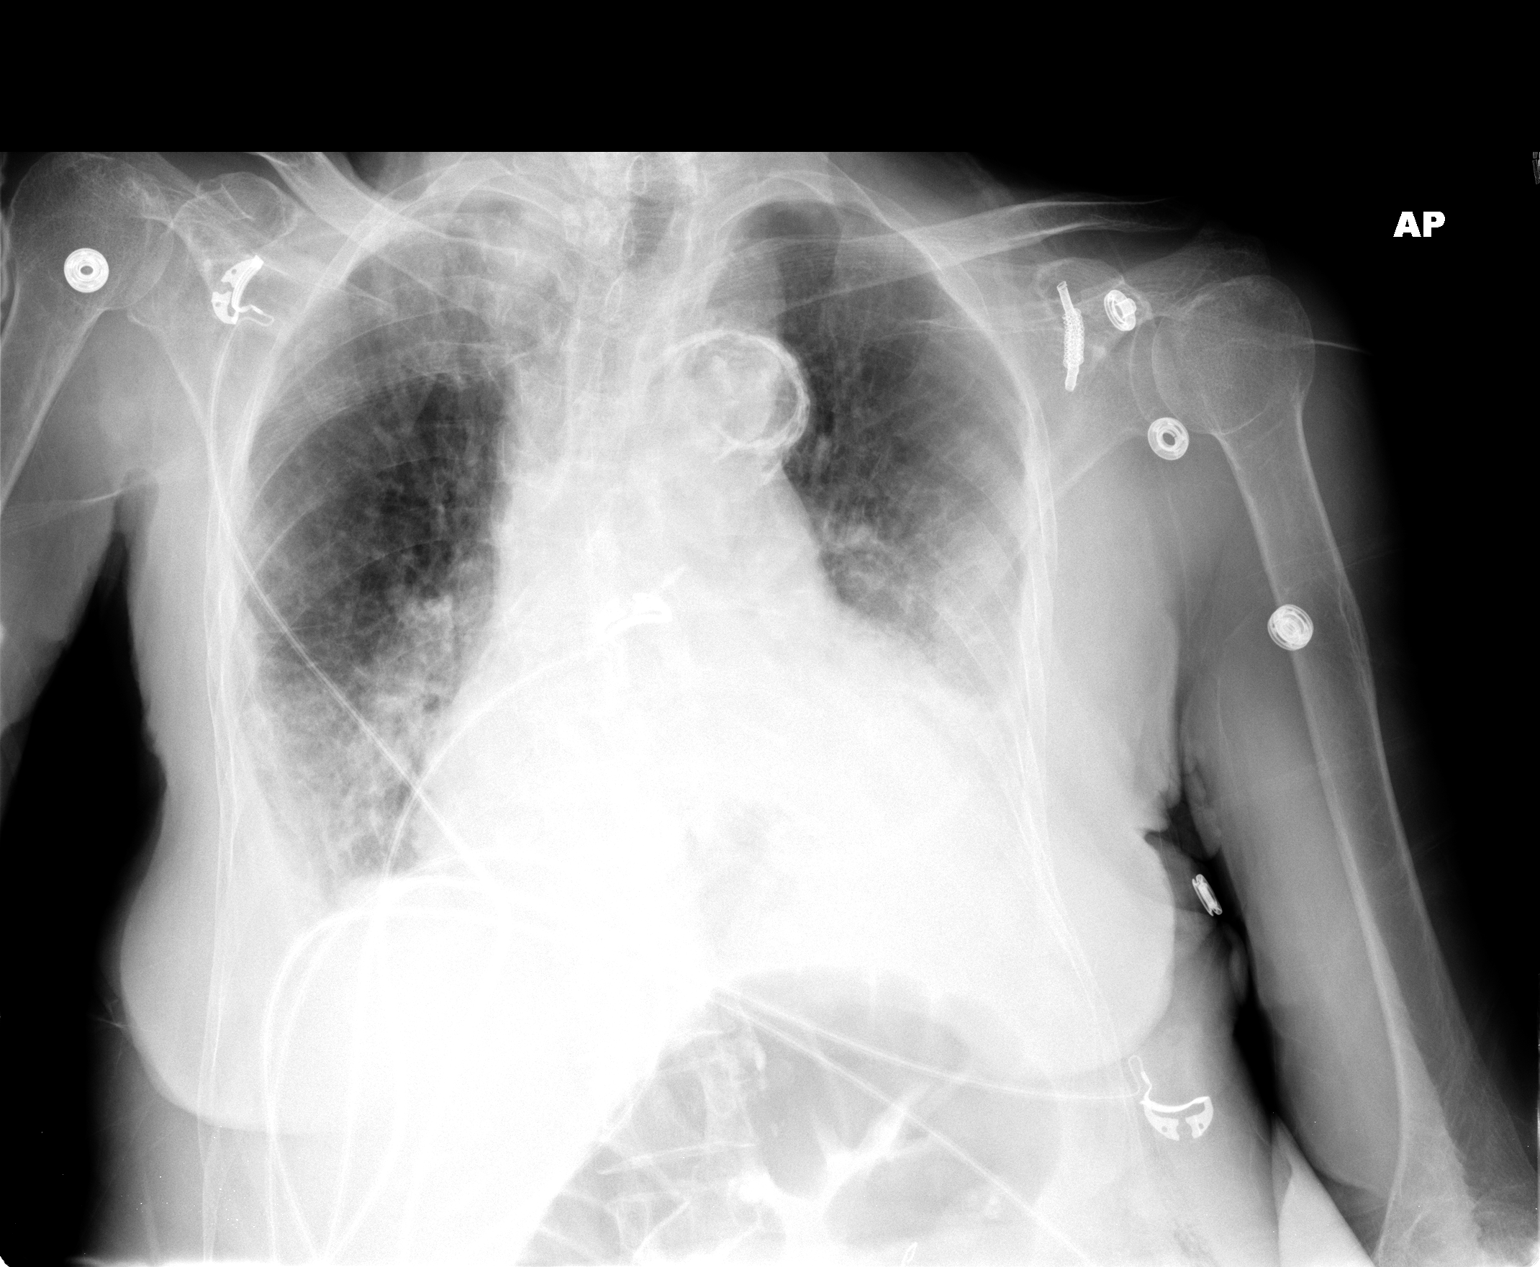

[2 of 2 positions shown; findings below may reference images not displayed]

FINDINGS: The lungs are well-expanded. The interstitial markings are increased
bilaterally. There are bilateral pleural effusions greater on the
left than on the right. There is obscuration of the left
hemidiaphragm. The cardiopericardial silhouette is enlarged. The
central pulmonary vascularity is mildly prominent. There is dense
calcification in the wall of the aortic arch. There is a known large
hiatal hernia-partially intra thoracic stomach contributing to the
retrocardiac density. The observed bony thorax exhibits no acute
abnormality.
IMPRESSION: Congestive heart failure with pulmonary interstitial edema bilateral
pleural effusions. One cannot exclude basilar pneumonia. The
appearance of the chest has deteriorated since the previous study.
